# Patient Record
Sex: Male | Born: 1935
Health system: Southern US, Community
[De-identification: ages and names within clinical notes are randomized; demographics above are authoritative.]

## PROBLEM LIST (undated history)

## (undated) DIAGNOSIS — E785 Hyperlipidemia, unspecified: Secondary | ICD-10-CM

## (undated) DIAGNOSIS — E119 Type 2 diabetes mellitus without complications: Secondary | ICD-10-CM

## (undated) DIAGNOSIS — D649 Anemia, unspecified: Secondary | ICD-10-CM

## (undated) DIAGNOSIS — N200 Calculus of kidney: Secondary | ICD-10-CM

## (undated) DIAGNOSIS — J449 Chronic obstructive pulmonary disease, unspecified: Secondary | ICD-10-CM

## (undated) DIAGNOSIS — N138 Other obstructive and reflux uropathy: Secondary | ICD-10-CM

## (undated) DIAGNOSIS — N401 Enlarged prostate with lower urinary tract symptoms: Secondary | ICD-10-CM

## (undated) DIAGNOSIS — I10 Essential (primary) hypertension: Secondary | ICD-10-CM

## (undated) DIAGNOSIS — N183 Chronic kidney disease, stage 3 (moderate): Secondary | ICD-10-CM

## (undated) HISTORY — PX: OTHER SURGICAL HISTORY: SHX169

---

## 2011-06-19 HISTORY — PX: EYE SURGERY: SHX253

## 2011-09-04 DIAGNOSIS — H6120 Impacted cerumen, unspecified ear: Secondary | ICD-10-CM | POA: Diagnosis not present

## 2011-09-07 DIAGNOSIS — H6120 Impacted cerumen, unspecified ear: Secondary | ICD-10-CM | POA: Diagnosis not present

## 2011-09-28 DIAGNOSIS — E7800 Pure hypercholesterolemia, unspecified: Secondary | ICD-10-CM | POA: Diagnosis not present

## 2011-09-28 DIAGNOSIS — I10 Essential (primary) hypertension: Secondary | ICD-10-CM | POA: Diagnosis not present

## 2011-09-28 DIAGNOSIS — N183 Chronic kidney disease, stage 3 unspecified: Secondary | ICD-10-CM | POA: Diagnosis not present

## 2011-11-16 DIAGNOSIS — N401 Enlarged prostate with lower urinary tract symptoms: Secondary | ICD-10-CM | POA: Diagnosis not present

## 2011-11-16 DIAGNOSIS — R972 Elevated prostate specific antigen [PSA]: Secondary | ICD-10-CM | POA: Diagnosis not present

## 2012-01-31 DIAGNOSIS — E7800 Pure hypercholesterolemia, unspecified: Secondary | ICD-10-CM | POA: Diagnosis not present

## 2012-01-31 DIAGNOSIS — I10 Essential (primary) hypertension: Secondary | ICD-10-CM | POA: Diagnosis not present

## 2012-02-11 DIAGNOSIS — E119 Type 2 diabetes mellitus without complications: Secondary | ICD-10-CM | POA: Diagnosis not present

## 2012-02-11 DIAGNOSIS — H2510 Age-related nuclear cataract, unspecified eye: Secondary | ICD-10-CM | POA: Diagnosis not present

## 2012-02-11 DIAGNOSIS — H26019 Infantile and juvenile cortical, lamellar, or zonular cataract, unspecified eye: Secondary | ICD-10-CM | POA: Diagnosis not present

## 2012-02-27 DIAGNOSIS — H2510 Age-related nuclear cataract, unspecified eye: Secondary | ICD-10-CM | POA: Diagnosis not present

## 2012-03-05 DIAGNOSIS — H2510 Age-related nuclear cataract, unspecified eye: Secondary | ICD-10-CM | POA: Diagnosis not present

## 2012-03-05 DIAGNOSIS — H21569 Pupillary abnormality, unspecified eye: Secondary | ICD-10-CM | POA: Diagnosis not present

## 2012-03-06 DIAGNOSIS — H2510 Age-related nuclear cataract, unspecified eye: Secondary | ICD-10-CM | POA: Diagnosis not present

## 2012-03-12 DIAGNOSIS — H2510 Age-related nuclear cataract, unspecified eye: Secondary | ICD-10-CM | POA: Diagnosis not present

## 2012-03-12 DIAGNOSIS — H21569 Pupillary abnormality, unspecified eye: Secondary | ICD-10-CM | POA: Diagnosis not present

## 2012-03-18 HISTORY — PX: CATARACT EXTRACTION, BILATERAL: SHX1313

## 2012-06-24 DIAGNOSIS — J42 Unspecified chronic bronchitis: Secondary | ICD-10-CM | POA: Diagnosis not present

## 2012-06-24 DIAGNOSIS — E7800 Pure hypercholesterolemia, unspecified: Secondary | ICD-10-CM | POA: Diagnosis not present

## 2012-06-24 DIAGNOSIS — I10 Essential (primary) hypertension: Secondary | ICD-10-CM | POA: Diagnosis not present

## 2012-07-03 DIAGNOSIS — Z961 Presence of intraocular lens: Secondary | ICD-10-CM | POA: Diagnosis not present

## 2012-07-17 ENCOUNTER — Inpatient Hospital Stay (HOSPITAL_COMMUNITY): Payer: BC Managed Care – PPO

## 2012-07-17 ENCOUNTER — Emergency Department (HOSPITAL_COMMUNITY): Payer: BC Managed Care – PPO

## 2012-07-17 ENCOUNTER — Inpatient Hospital Stay (HOSPITAL_COMMUNITY)
Admission: EM | Admit: 2012-07-17 | Discharge: 2012-08-04 | DRG: 541 | Disposition: A | Discharge status: Skilled Nursing Facility | Payer: BC Managed Care – PPO | Attending: Internal Medicine | Admitting: Internal Medicine

## 2012-07-17 ENCOUNTER — Encounter (HOSPITAL_COMMUNITY): Payer: Self-pay | Admitting: *Deleted

## 2012-07-17 DIAGNOSIS — R0902 Hypoxemia: Secondary | ICD-10-CM | POA: Insufficient documentation

## 2012-07-17 DIAGNOSIS — E8779 Other fluid overload: Secondary | ICD-10-CM | POA: Diagnosis present

## 2012-07-17 DIAGNOSIS — I2699 Other pulmonary embolism without acute cor pulmonale: Principal | ICD-10-CM | POA: Diagnosis present

## 2012-07-17 DIAGNOSIS — D649 Anemia, unspecified: Secondary | ICD-10-CM | POA: Diagnosis present

## 2012-07-17 DIAGNOSIS — I82409 Acute embolism and thrombosis of unspecified deep veins of unspecified lower extremity: Secondary | ICD-10-CM | POA: Diagnosis not present

## 2012-07-17 DIAGNOSIS — J189 Pneumonia, unspecified organism: Secondary | ICD-10-CM | POA: Diagnosis present

## 2012-07-17 DIAGNOSIS — M5126 Other intervertebral disc displacement, lumbar region: Secondary | ICD-10-CM | POA: Diagnosis present

## 2012-07-17 DIAGNOSIS — M48061 Spinal stenosis, lumbar region without neurogenic claudication: Secondary | ICD-10-CM | POA: Diagnosis present

## 2012-07-17 DIAGNOSIS — D72829 Elevated white blood cell count, unspecified: Secondary | ICD-10-CM | POA: Diagnosis present

## 2012-07-17 DIAGNOSIS — M479 Spondylosis, unspecified: Secondary | ICD-10-CM | POA: Diagnosis present

## 2012-07-17 DIAGNOSIS — J40 Bronchitis, not specified as acute or chronic: Secondary | ICD-10-CM | POA: Diagnosis present

## 2012-07-17 DIAGNOSIS — J9620 Acute and chronic respiratory failure, unspecified whether with hypoxia or hypercapnia: Secondary | ICD-10-CM | POA: Diagnosis present

## 2012-07-17 DIAGNOSIS — J449 Chronic obstructive pulmonary disease, unspecified: Secondary | ICD-10-CM

## 2012-07-17 DIAGNOSIS — R0602 Shortness of breath: Secondary | ICD-10-CM | POA: Diagnosis not present

## 2012-07-17 DIAGNOSIS — J9600 Acute respiratory failure, unspecified whether with hypoxia or hypercapnia: Secondary | ICD-10-CM | POA: Diagnosis present

## 2012-07-17 DIAGNOSIS — N179 Acute kidney failure, unspecified: Secondary | ICD-10-CM | POA: Diagnosis not present

## 2012-07-17 DIAGNOSIS — N189 Chronic kidney disease, unspecified: Secondary | ICD-10-CM | POA: Diagnosis not present

## 2012-07-17 DIAGNOSIS — R339 Retention of urine, unspecified: Secondary | ICD-10-CM | POA: Diagnosis present

## 2012-07-17 DIAGNOSIS — R29898 Other symptoms and signs involving the musculoskeletal system: Secondary | ICD-10-CM | POA: Diagnosis not present

## 2012-07-17 DIAGNOSIS — Z79899 Other long term (current) drug therapy: Secondary | ICD-10-CM

## 2012-07-17 DIAGNOSIS — I714 Abdominal aortic aneurysm, without rupture, unspecified: Secondary | ICD-10-CM | POA: Diagnosis present

## 2012-07-17 DIAGNOSIS — N139 Obstructive and reflux uropathy, unspecified: Secondary | ICD-10-CM | POA: Diagnosis present

## 2012-07-17 DIAGNOSIS — E785 Hyperlipidemia, unspecified: Secondary | ICD-10-CM | POA: Diagnosis present

## 2012-07-17 DIAGNOSIS — IMO0002 Reserved for concepts with insufficient information to code with codable children: Secondary | ICD-10-CM

## 2012-07-17 DIAGNOSIS — R0609 Other forms of dyspnea: Secondary | ICD-10-CM | POA: Diagnosis not present

## 2012-07-17 DIAGNOSIS — N138 Other obstructive and reflux uropathy: Secondary | ICD-10-CM | POA: Diagnosis present

## 2012-07-17 DIAGNOSIS — N200 Calculus of kidney: Secondary | ICD-10-CM | POA: Diagnosis present

## 2012-07-17 DIAGNOSIS — M79609 Pain in unspecified limb: Secondary | ICD-10-CM

## 2012-07-17 DIAGNOSIS — J441 Chronic obstructive pulmonary disease with (acute) exacerbation: Secondary | ICD-10-CM | POA: Diagnosis present

## 2012-07-17 DIAGNOSIS — N401 Enlarged prostate with lower urinary tract symptoms: Secondary | ICD-10-CM | POA: Diagnosis present

## 2012-07-17 DIAGNOSIS — N183 Chronic kidney disease, stage 3 unspecified: Secondary | ICD-10-CM | POA: Diagnosis present

## 2012-07-17 DIAGNOSIS — I10 Essential (primary) hypertension: Secondary | ICD-10-CM | POA: Diagnosis present

## 2012-07-17 DIAGNOSIS — R000 Tachycardia, unspecified: Secondary | ICD-10-CM | POA: Diagnosis present

## 2012-07-17 DIAGNOSIS — I129 Hypertensive chronic kidney disease with stage 1 through stage 4 chronic kidney disease, or unspecified chronic kidney disease: Secondary | ICD-10-CM | POA: Diagnosis present

## 2012-07-17 DIAGNOSIS — E119 Type 2 diabetes mellitus without complications: Secondary | ICD-10-CM | POA: Diagnosis present

## 2012-07-17 DIAGNOSIS — E876 Hypokalemia: Secondary | ICD-10-CM | POA: Diagnosis not present

## 2012-07-17 DIAGNOSIS — M7989 Other specified soft tissue disorders: Secondary | ICD-10-CM

## 2012-07-17 HISTORY — DX: Hyperlipidemia, unspecified: E78.5

## 2012-07-17 HISTORY — DX: Calculus of kidney: N20.0

## 2012-07-17 HISTORY — DX: Other obstructive and reflux uropathy: N13.8

## 2012-07-17 HISTORY — DX: Benign prostatic hyperplasia with lower urinary tract symptoms: N40.1

## 2012-07-17 HISTORY — DX: Chronic kidney disease, stage 3 unspecified: N18.30

## 2012-07-17 HISTORY — DX: Essential (primary) hypertension: I10

## 2012-07-17 HISTORY — DX: Type 2 diabetes mellitus without complications: E11.9

## 2012-07-17 HISTORY — DX: Chronic obstructive pulmonary disease, unspecified: J44.9

## 2012-07-17 HISTORY — DX: Chronic kidney disease, stage 3 (moderate): N18.3

## 2012-07-17 HISTORY — DX: Anemia, unspecified: D64.9

## 2012-07-17 LAB — BLOOD GAS, ARTERIAL
Acid-base deficit: 4.2 mmol/L — ABNORMAL HIGH (ref 0.0–2.0)
Drawn by: 313061
FIO2: 1 %
pCO2 arterial: 42.5 mmHg (ref 35.0–45.0)
pH, Arterial: 7.321 — ABNORMAL LOW (ref 7.350–7.450)
pO2, Arterial: 259 mmHg — ABNORMAL HIGH (ref 80.0–100.0)

## 2012-07-17 LAB — COMPREHENSIVE METABOLIC PANEL
ALT: 21 U/L (ref 0–53)
Alkaline Phosphatase: 116 U/L (ref 39–117)
CO2: 23 mEq/L (ref 19–32)
GFR calc Af Amer: 36 mL/min — ABNORMAL LOW (ref >90)
GFR calc non Af Amer: 31 mL/min — ABNORMAL LOW (ref >90)
Glucose, Bld: 214 mg/dL — ABNORMAL HIGH (ref 70–99)
Potassium: 4.9 mEq/L (ref 3.5–5.1)
Sodium: 137 mEq/L (ref 135–145)
Total Bilirubin: 1.1 mg/dL (ref 0.3–1.2)

## 2012-07-17 LAB — GLUCOSE, CAPILLARY
Glucose-Capillary: 153 mg/dL — ABNORMAL HIGH (ref 70–99)
Glucose-Capillary: 152 mg/dL — ABNORMAL HIGH (ref 70–99)

## 2012-07-17 LAB — CBC WITH DIFFERENTIAL/PLATELET
Eosinophils Relative: 0 % (ref 0–5)
Lymphocytes Relative: 4 % — ABNORMAL LOW (ref 12–46)
Lymphs Abs: 0.8 10*3/uL (ref 0.7–4.0)
MCV: 95.1 fL (ref 78.0–100.0)
Neutrophils Relative %: 89 % — ABNORMAL HIGH (ref 43–77)
Platelets: 155 10*3/uL (ref 150–400)
RBC: 5.26 MIL/uL (ref 4.22–5.81)
WBC: 19.4 10*3/uL — ABNORMAL HIGH (ref 4.0–10.5)

## 2012-07-17 LAB — MAGNESIUM: Magnesium: 1.8 mg/dL (ref 1.5–2.5)

## 2012-07-17 LAB — APTT: aPTT: 31 seconds (ref 24–37)

## 2012-07-17 MED ORDER — LEVOFLOXACIN IN D5W 750 MG/150ML IV SOLN
750.0000 mg | INTRAVENOUS | Status: DC
  Administered 2012-07-17: 750 mg via INTRAVENOUS
  Filled 2012-07-17 (×2): qty 150

## 2012-07-17 MED ORDER — IOHEXOL 350 MG/ML SOLN: 80.0000 mL | Freq: Once | INTRAVENOUS | Status: DC | PRN

## 2012-07-17 MED ORDER — HEPARIN BOLUS VIA INFUSION
4000.0000 [IU] | Freq: Once | INTRAVENOUS | Status: AC
  Administered 2012-07-17: 4000 [IU] via INTRAVENOUS

## 2012-07-17 MED ORDER — ONDANSETRON HCL 4 MG PO TABS: 4.0000 mg | ORAL_TABLET | Freq: Four times a day (QID) | ORAL | Status: DC | PRN

## 2012-07-17 MED ORDER — SODIUM CHLORIDE 0.9 % IV SOLN
INTRAVENOUS | Status: AC
  Administered 2012-07-17: 50 mL/h via INTRAVENOUS

## 2012-07-17 MED ORDER — RED YEAST RICE 600 MG PO TABS: 1800.0000 mg | ORAL_TABLET | Freq: Every morning | ORAL | Status: DC

## 2012-07-17 MED ORDER — ALUM & MAG HYDROXIDE-SIMETH 200-200-20 MG/5ML PO SUSP: 30.0000 mL | Freq: Four times a day (QID) | ORAL | Status: DC | PRN

## 2012-07-17 MED ORDER — HEPARIN (PORCINE) IN NACL 100-0.45 UNIT/ML-% IJ SOLN
1500.0000 [IU]/h | INTRAMUSCULAR | Status: DC
  Administered 2012-07-17: 1500 [IU]/h via INTRAVENOUS
  Filled 2012-07-17 (×2): qty 250

## 2012-07-17 MED ORDER — ACETAMINOPHEN 325 MG PO TABS: 650.0000 mg | ORAL_TABLET | Freq: Four times a day (QID) | ORAL | Status: DC | PRN

## 2012-07-17 MED ORDER — SENNOSIDES-DOCUSATE SODIUM 8.6-50 MG PO TABS
1.0000 | ORAL_TABLET | Freq: Every evening | ORAL | Status: DC | PRN
  Filled 2012-07-17: qty 1

## 2012-07-17 MED ORDER — SODIUM CHLORIDE 0.9 % IV SOLN
250.0000 mL | INTRAVENOUS | Status: DC | PRN
  Filled 2012-07-17: qty 250

## 2012-07-17 MED ORDER — LEVALBUTEROL HCL 0.63 MG/3ML IN NEBU
0.6300 mg | INHALATION_SOLUTION | Freq: Four times a day (QID) | RESPIRATORY_TRACT | Status: DC
  Administered 2012-07-17 – 2012-07-21 (×16): 0.63 mg via RESPIRATORY_TRACT
  Filled 2012-07-17 (×22): qty 3

## 2012-07-17 MED ORDER — GUAIFENESIN 100 MG/5ML PO SYRP
200.0000 mg | ORAL_SOLUTION | ORAL | Status: DC | PRN
  Filled 2012-07-17: qty 118

## 2012-07-17 MED ORDER — ALBUTEROL SULFATE (5 MG/ML) 0.5% IN NEBU
2.5000 mg | INHALATION_SOLUTION | Freq: Once | RESPIRATORY_TRACT | Status: AC
  Administered 2012-07-17: 2.5 mg via RESPIRATORY_TRACT

## 2012-07-17 MED ORDER — SODIUM CHLORIDE 0.9 % IJ SOLN
3.0000 mL | Freq: Two times a day (BID) | INTRAMUSCULAR | Status: DC
  Administered 2012-07-20 – 2012-08-04 (×17): 3 mL via INTRAVENOUS

## 2012-07-17 MED ORDER — MOMETASONE FURO-FORMOTEROL FUM 200-5 MCG/ACT IN AERO
2.0000 | INHALATION_SPRAY | Freq: Two times a day (BID) | RESPIRATORY_TRACT | Status: DC
  Administered 2012-07-17 – 2012-08-04 (×35): 2 via RESPIRATORY_TRACT
  Filled 2012-07-17 (×2): qty 8.8

## 2012-07-17 MED ORDER — TAMSULOSIN HCL 0.4 MG PO CAPS
0.4000 mg | ORAL_CAPSULE | Freq: Every day | ORAL | Status: DC
  Administered 2012-07-17 – 2012-08-03 (×18): 0.4 mg via ORAL
  Filled 2012-07-17 (×19): qty 1

## 2012-07-17 MED ORDER — ONDANSETRON HCL 4 MG/2ML IJ SOLN: 4.0000 mg | Freq: Four times a day (QID) | INTRAMUSCULAR | Status: DC | PRN

## 2012-07-17 MED ORDER — LEVOFLOXACIN IN D5W 750 MG/150ML IV SOLN
750.0000 mg | INTRAVENOUS | Status: DC
  Filled 2012-07-17: qty 150

## 2012-07-17 MED ORDER — LEVALBUTEROL HCL 0.63 MG/3ML IN NEBU
0.6300 mg | INHALATION_SOLUTION | RESPIRATORY_TRACT | Status: DC | PRN
  Filled 2012-07-17: qty 3

## 2012-07-17 MED ORDER — ACETAMINOPHEN 650 MG RE SUPP: 650.0000 mg | Freq: Four times a day (QID) | RECTAL | Status: DC | PRN

## 2012-07-17 MED ORDER — GUAIFENESIN 100 MG/5ML PO SOLN
200.0000 mg | ORAL | Status: DC | PRN
  Administered 2012-07-21: 200 mg via ORAL
  Filled 2012-07-17: qty 10

## 2012-07-17 MED ORDER — ALBUTEROL SULFATE (5 MG/ML) 0.5% IN NEBU
INHALATION_SOLUTION | RESPIRATORY_TRACT | Status: AC
  Filled 2012-07-17: qty 0.5

## 2012-07-17 MED ORDER — INSULIN ASPART 100 UNIT/ML ~~LOC~~ SOLN
0.0000 [IU] | Freq: Three times a day (TID) | SUBCUTANEOUS | Status: DC
  Administered 2012-07-18: 3 [IU] via SUBCUTANEOUS
  Administered 2012-07-19: 11 [IU] via SUBCUTANEOUS
  Administered 2012-07-19: 3 [IU] via SUBCUTANEOUS
  Administered 2012-07-19: 2 [IU] via SUBCUTANEOUS

## 2012-07-17 MED ORDER — SODIUM CHLORIDE 0.9 % IJ SOLN
3.0000 mL | INTRAMUSCULAR | Status: DC | PRN
  Administered 2012-07-27: 3 mL via INTRAVENOUS

## 2012-07-17 MED ORDER — BUDESONIDE-FORMOTEROL FUMARATE 160-4.5 MCG/ACT IN AERO: 2.0000 | INHALATION_SPRAY | Freq: Two times a day (BID) | RESPIRATORY_TRACT | Status: DC

## 2012-07-17 MED ORDER — ALBUTEROL SULFATE (5 MG/ML) 0.5% IN NEBU: 2.5000 mg | INHALATION_SOLUTION | RESPIRATORY_TRACT | Status: DC | PRN

## 2012-07-17 MED ORDER — OXYCODONE HCL 5 MG PO TABS
5.0000 mg | ORAL_TABLET | ORAL | Status: DC | PRN
  Administered 2012-07-24 – 2012-08-01 (×7): 5 mg via ORAL
  Filled 2012-07-17 (×8): qty 1

## 2012-07-17 MED ORDER — IPRATROPIUM BROMIDE 0.02 % IN SOLN: 0.5000 mg | RESPIRATORY_TRACT | Status: DC | PRN

## 2012-07-17 MED ORDER — SODIUM CHLORIDE 0.9 % IJ SOLN
3.0000 mL | Freq: Two times a day (BID) | INTRAMUSCULAR | Status: DC
  Administered 2012-07-17: 3 mL via INTRAVENOUS

## 2012-07-17 MED ORDER — IPRATROPIUM BROMIDE 0.02 % IN SOLN
0.5000 mg | Freq: Four times a day (QID) | RESPIRATORY_TRACT | Status: DC
  Administered 2012-07-17 – 2012-08-04 (×69): 0.5 mg via RESPIRATORY_TRACT
  Filled 2012-07-17 (×72): qty 2.5

## 2012-07-17 MED ORDER — ALBUTEROL SULFATE (5 MG/ML) 0.5% IN NEBU
2.5000 mg | INHALATION_SOLUTION | Freq: Once | RESPIRATORY_TRACT | Status: DC
  Filled 2012-07-17: qty 0.5

## 2012-07-17 MED ORDER — SODIUM CHLORIDE 0.9 % IV BOLUS (SEPSIS)
1000.0000 mL | Freq: Once | INTRAVENOUS | Status: AC
  Administered 2012-07-17: 1000 mL via INTRAVENOUS

## 2012-07-17 MED ORDER — BISACODYL 5 MG PO TBEC: 5.0000 mg | DELAYED_RELEASE_TABLET | Freq: Every day | ORAL | Status: DC | PRN

## 2012-07-17 NOTE — H&P (Signed)
[facility] History and Physical  Alexander Kirby WUJ:[id number] DOB: 1934/10/11 DOA: 07/17/2012  Referring physician: Dr. Jeraldine Loots PCP: Ailene Ravel, MD  Specialists: None  Chief Complaint: I can't breathe. My COPD is acting up.  HPI: Alexander Kirby is a 78 y.o. male with history of COPD with remote history of tobacco use, chronic kidney disease stage III, diabetes mellitus, hyperlipidemia, BPH being followed by Dr. Wallace Cullens. Urology, hypertension who presents to the ED with a 3 week history of worsening shortness of breath, wheezing, productive cough of greenish sputum occasionally and whitish sputum, generalized weakness, and lower extremity edema. Patient states she had presented to PCPs office a couple of weeks prior to admission and was treated at that time were for COPD exacerbation with doxycycline. Patient finished the course of Doxy call back a week later and had that refilled again. Patient presented to his PCPs office on the day of admission with continued shortness of breath and wheezing coughing and productive mucus. In PCPs office patient was noted to be hypoxic with sats of 80% on room air, and blood pressure of 84/70. EMS was called patient with was placed on oxygen and transported to the ED. In the ED d-dimer which was obtained was elevated at 11.20. CBC obtained had a white count of 19.4 otherwise was within normal limits. Comprehensive metabolic profile was a BUN of 40 and a creatinine of 1.99 with a glucose of 214. Patient was unable to lay flat for CT scan and a such CT angiogram cannot be obtained. Lower extremity Dopplers done was consistent with a left lower extremity DVT. Patient was started on IV heparin. We were called to admit the patient for further evaluation and management.  Review of Systems: The patient denies anorexia, fever, weight loss,, vision loss, decreased hearing, hoarseness, chest pain, syncope, dyspnea on exertion, peripheral edema, balance deficits, hemoptysis,  abdominal pain, melena, hematochezia, severe indigestion/heartburn, hematuria, incontinence, genital sores, muscle weakness, suspicious skin lesions, transient blindness, difficulty walking, depression, unusual weight change, abnormal bleeding, enlarged lymph nodes, angioedema, and breast masses.   Past Medical History  Diagnosis Date  . COPD (chronic obstructive pulmonary disease)   . Hypertension   . Diabetes mellitus without complication   . Anemia   . Diabetes mellitus 07/17/2012  . HTN (hypertension) 07/17/2012  . Hyperlipidemia 07/17/2012  . CKD (chronic kidney disease) stage 3, GFR 30-59 ml/min 07/17/2012  . BPH (benign prostatic hypertrophy) with urinary obstruction 07/17/2012  . Calculus of kidney 07/17/2012   Past Surgical History  Procedure Date  . Cataract extraction, bilateral Oct. 2013    bilateral eyes  . Other surgical history 72 months old    pt states "I was ruptured and had surgery."  . Eye surgery 2013    cataract bilateral extraction   Social History:  reports that he quit smoking about 12 years ago. He has never used smokeless tobacco. He reports that he does not drink alcohol or use illicit drugs.  No Known Allergies  History reviewed. No pertinent family history.   Prior to Admission medications   Medication Sig Start Date End Date Taking? Authorizing Provider  albuterol (PROVENTIL) (2.5 MG/3ML) 0.083% nebulizer solution Take 2.5 mg by nebulization every 6 (six) hours as needed. FOR SHORTNESS OF BREATH OR WHEEZING   Yes Historical Provider, MD  budesonide-formoterol (SYMBICORT) 160-4.5 MCG/ACT inhaler Inhale 2 puffs into the lungs 2 (two) times daily.   Yes Historical Provider, MD  enalapril-hydrochlorothiazide (VASERETIC) 10-25 MG per tablet Take 1 tablet by  mouth every morning.   Yes Historical Provider, MD  Fluticasone-Salmeterol (ADVAIR) 500-50 MCG/DOSE AEPB Inhale 1 puff into the lungs every 12 (twelve) hours.   Yes Historical Provider, MD  glimepiride  (AMARYL) 1 MG tablet Take 1 mg by mouth daily before breakfast.   Yes Historical Provider, MD  NIFEdipine (PROCARDIA XL/ADALAT-CC) 60 MG 24 hr tablet Take 60 mg by mouth every morning.   Yes Historical Provider, MD  pirbuterol (MAXAIR) 200 MCG/INH inhaler Inhale 2 puffs into the lungs 4 (four) times daily as needed. FOR SHORTNESS OF BREATH OR WHEEZING   Yes Historical Provider, MD  Plant Sterols and Stanols (CHOLEST OFF) 450 MG TABS Take 450 mg by mouth every morning.   Yes Historical Provider, MD  Red Yeast Rice 600 MG TABS Take 1,800 mg by mouth every morning.   Yes Historical Provider, MD  Tamsulosin HCl (FLOMAX) 0.4 MG CAPS Take 0.4 mg by mouth daily after supper.   Yes Historical Provider, MD   Physical Exam: Filed Vitals:   07/17/12 1214 07/17/12 1300 07/17/12 1521 07/17/12 1637  BP:    132/78  Pulse:    107  Temp:      TempSrc:      Resp:    20  Height:  5\' 9"  (1.753 m)    Weight:   93.123 kg (205 lb 4.8 oz)   SpO2: 95%   100%     General:  Within the right on a gurney on a nonrebreather speaking in full sentences with mild use of accessory muscles of respiration.  Eyes: Pupils equal round and reactive to light and accommodation. Extraocular movements intact.  ENT: Oropharynx is clear, no lesions, no exudates.   Neck: Supple with no lymphadenopathy.  Cardiovascular: Tachycardic regular rhythm. No murmurs rubs or gallops. 1+ bilateral lower extremity edema.  Respiratory: Patient with minimal to mild expiratory wheezing. No crackles. No rhonchi. Upper airway noise.  Abdomen: Obese, soft, nontender, nondistended, positive bowel sounds.  Skin: No rashes or lesions.  Musculoskeletal: 5 out of 5 bilateral upper extremity strength. 5 out of 5 bilateral lower extremity strength.  Psychiatric: Normal mood. Normal affect. Fair insight. Fair judgment.  Neurologic: Alert and oriented x3. CN 2 through 12 are grossly intact. No focal deficits. Gait not tested secondary to safety.    Labs on Admission:  Basic Metabolic Panel:  Lab 07/17/12 5366  NA 137  K 4.9  CL 99  CO2 23  GLUCOSE 214*  BUN 40*  CREATININE 1.99*  CALCIUM 9.4  MG --  PHOS --   Liver Function Tests:  Lab 07/17/12 1315  AST 18  ALT 21  ALKPHOS 116  BILITOT 1.1  PROT 6.4  ALBUMIN 2.7*   No results found for this basename: LIPASE:5,AMYLASE:5 in the last 168 hours No results found for this basename: AMMONIA:5 in the last 168 hours CBC:  Lab 07/17/12 1315  WBC 19.4*  NEUTROABS 17.2*  HGB 16.9  HCT 50.0  MCV 95.1  PLT 155   Cardiac Enzymes:  Lab 07/17/12 1315  CKTOTAL --  CKMB --  CKMBINDEX --  TROPONINI <0.30    BNP (last 3 results)  Basename 07/17/12 1315  PROBNP 1312.0*   CBG:  Lab 07/17/12 1205  GLUCAP 204*    Radiological Exams on Admission: Dg Chest 2 View  07/17/2012  *RADIOLOGY REPORT*  Clinical Data: History of dyspnea and COPD.  CHEST - 2 VIEW  Comparison: None.  Findings: There is mild enlargement cardiac silhouette.  There is  rounded prominence of the main pulmonary artery area. Ectasia and nonaneurysmal calcification of the thoracic aorta are seen.  Patchy infiltrative densities and atelectasis are seen in the right lung base. Haziness and increased markings are seen in the left lung base.  This may be associated with epicardial fat pad.  There is flattening of the low-lying diaphragm on lateral image consistent with overall hyperinflation configuration and COPD.  There is osteopenic appearance of bones.  Changes of degenerative disc disease and degenerative spondylosis are present.  IMPRESSION: Overall hyperinflation configuration consistent with COPD. Patchy infiltrative densities and atelectasis are seen in the right lung base. Haziness and increased markings are seen in the left lung base.  This may be associated with epicardial fat pad. Chronic findings described above.   Original Report Authenticated By: Onalee Hua Call     EKG:  None  Assessment/Plan Principal Problem:  *Acute respiratory failure Active Problems:  DVT (deep venous thrombosis)  Leukocytosis  Acute on chronic renal failure  Hypoxia  COPD (chronic obstructive pulmonary disease)  Diabetes mellitus  HTN (hypertension)  Anemia  Hyperlipidemia  CKD (chronic kidney disease) stage 3, GFR 30-59 ml/min  BPH (benign prostatic hypertrophy) with urinary obstruction  Calculus of kidney   #1 acute respiratory failure Likely secondary to probable PE in the setting of possible COPD exacerbation. Lower extremity Dopplers positive for left lower extremity DVT. Patient unable to get a CT angio secondary to chronic kidney disease and inability to lay flat. Patient with sats of 80% on route per EMS. Patient also with wheezing on exam. Chest x-ray negative for any acute infiltrate. Will admit to step down unit. Will check ABG. We'll place empirically on IV heparin. We'll place on oxygen, IV Levaquin, nebs, oxygen. Follow.  #2 hypoxia Likely secondary to probable PE. Patient with left lower extremity DVT on Doppler ultrasound. Patient presented to the ED hypoxic, tachycardic, with worsening shortness of breath. Place on IV heparin. Follow.  #3 left lower extremity DVT IV heparin.  #4 COPD Patient with some weeping on exam. Patient also had upper respirations symptoms and a productive cough per PCPs notes. We'll place on IV Levaquin, nebs, oxygen, follow.  #5 acute on chronic kidney disease Per PCPs records patient's last creatinine was 1.79 09/28/2011. Current creatinine is 1.99. Will check a urine sodium. Check a urine creatinine. Check a renal ultrasound. Place a Foley catheter. Will hold patient's ACE inhibitor and diuretic. Follow.  #6 anemia Follow H&H.  #7 hypertension Stable. Will hold patient's enalapril and HCTZ secondary to problem #5.  #8 leukocytosis Likely a reactive leukocytosis. Chest x-ray is negative. Will check a UA with cultures and  sensitivities. Will place empirically on IV Levaquin secondary to probable COPD exacerbation. Follow.  #9 diabetes mellitus Check a hemoglobin A1c. Place on sliding scale insulin.  #10 prophylaxis On full dose heparin for DVT prophylaxis.  Code Status: Full Family Communication: Updated patient and wife at bedside the Disposition Plan: Admit to the step down  Time spent: 75 mins  [facility] Pager [phone]  If 7PM-7AM, please contact night-coverage [email or web address] Password [email or web address] 07/17/2012, 5:09 PM

## 2012-07-17 NOTE — Progress Notes (Signed)
Pt confirms pcp as dr Nathanial Rancher epic updated

## 2012-07-17 NOTE — Progress Notes (Signed)
ANTICOAGULATION CONSULT NOTE - Initial Consult  Pharmacy Consult for heparin Indication: DVT/possible PE  No Known Allergies  Patient Measurements:   Heparin Dosing Weight: 90kg  Vital Signs: Temp: 97.6 F (36.4 C) (01/30 1149) Temp src: Oral (01/30 1149) BP: 124/68 mmHg (01/30 1149) Pulse Rate: 114  (01/30 1149)  Labs:  Basename 07/17/12 1315  HGB 16.9  HCT 50.0  PLT 155  APTT --  LABPROT --  INR --  HEPARINUNFRC --  CREATININE 1.99*  CKTOTAL --  CKMB --  TROPONINI <0.30    CrCl is unknown because there is no height on file for the current visit.   Medical History: Past Medical History  Diagnosis Date  . COPD (chronic obstructive pulmonary disease)   . Hypertension   . Diabetes mellitus without complication   . Anemia     Assessment: 77 YOM presents with SOB, LE dopplers reveals L LE DVT.  D-dimer elevated. Patient having difficulty speaking in sentences without getting SOB.  NO recent procedures, had BL cataract surgery in Oct. Wife states he bleeds easily but per medication list not on any antiplatelets or blood thinners. Patient is not certain of his weight, states ~220lbs (appears accurate), states unable to stand to get on scale.   Goal of Therapy:  Heparin level 0.3-0.7 units/ml Monitor platelets by anticoagulation protocol: Yes   Plan:   Heparin 4000 units bolus then heparin gtt 1500 units/hr  Check baseline PT/INR, aPTT  Check 8hr heparin level then daily level and CBC  Weigh patient when gets to floor  Await plans for long-term anticoagulation  Suzette Battiest, Tad Moore 07/17/2012,2:38 PM

## 2012-07-17 NOTE — ED Notes (Signed)
Per EMS, pt picked up from his PMD d/t SOB.  Pt reports visiting his PMD last week for same, was given rx for doxycycline without relief.  Per EMS, pt's sats was at 80's RA, placed on 2LNC O2 along with 2.5mg  of albuterol-sats 96%.  Per EMS, pt with bila pedal edema, with pain in his R calf.  Denies any cp at this time.  Pt is A&Ox 4.

## 2012-07-17 NOTE — ED Provider Notes (Signed)
History     CSN: [id number]  Arrival date & time 07/17/12  1139   First MD Initiated Contact with Patient 07/17/12 1144      Chief Complaint  Patient presents with  . Shortness of Breath    (Consider location/radiation/quality/duration/timing/severity/associated sxs/prior treatment) HPI  The patient presents with concern of dyspnea, ongoing chest tightness, lower extremity edema.  Symptoms actually began approximately 3 months ago, subtly, and over the past month this has had persistent cough, moderate respiratory discomfort, and until recently was fairly stable.  The past 2 weeks the patient has had increasing dyspnea, and recently been started on home oxygen, which seems to change the symptoms minimally.  He also complains of new lower extremity edema, the right greater than the left, with pain in his right calf. He denies significant chest pain, notes that there is mild tightness. He denies lightheadedness, syncope, vomiting, diarrhea. He saw his physician today, was referred here for further evaluation.  Past Medical History  Diagnosis Date  . COPD (chronic obstructive pulmonary disease)   . Hypertension   . Diabetes mellitus without complication   . Anemia     History reviewed. No pertinent past surgical history.  No family history on file.  History  Substance Use Topics  . Smoking status: Never Smoker   . Smokeless tobacco: Not on file  . Alcohol Use: No      Review of Systems  Constitutional:       Per HPI, otherwise negative  HENT:       Per HPI, otherwise negative  Eyes: Negative.   Respiratory:       Per HPI, otherwise negative  Cardiovascular:       Per HPI, otherwise negative  Gastrointestinal: Negative for vomiting.  Genitourinary: Negative.   Musculoskeletal:       Per HPI, otherwise negative  Skin: Negative.   Neurological: Negative for syncope.    Allergies  Review of patient's allergies indicates no known allergies.  Home Medications    Current Outpatient Rx  Name  Route  Sig  Dispense  Refill  . ALBUTEROL SULFATE (2.5 MG/3ML) 0.083% IN NEBU   Nebulization   Take 2.5 mg by nebulization every 6 (six) hours as needed. FOR SHORTNESS OF BREATH OR WHEEZING         . BUDESONIDE-FORMOTEROL FUMARATE 160-4.5 MCG/ACT IN AERO   Inhalation   Inhale 2 puffs into the lungs 2 (two) times daily.         . ENALAPRIL-HYDROCHLOROTHIAZIDE 10-25 MG PO TABS   Oral   Take 1 tablet by mouth every morning.         Marland Kitchen FLUTICASONE-SALMETEROL 500-50 MCG/DOSE IN AEPB   Inhalation   Inhale 1 puff into the lungs every 12 (twelve) hours.         Marland Kitchen GLIMEPIRIDE 1 MG PO TABS   Oral   Take 1 mg by mouth daily before breakfast.         . NIFEDIPINE ER OSMOTIC 60 MG PO TB24   Oral   Take 60 mg by mouth every morning.         Marland Kitchen PIRBUTEROL ACETATE 200 MCG/INH IN AERB   Inhalation   Inhale 2 puffs into the lungs 4 (four) times daily as needed. FOR SHORTNESS OF BREATH OR WHEEZING         . PLANT STEROLS AND STANOLS 450 MG PO TABS   Oral   Take 450 mg by mouth every morning.         Marland Kitchen  RED YEAST RICE 600 MG PO TABS   Oral   Take 1,800 mg by mouth every morning.         Marland Kitchen TAMSULOSIN HCL 0.4 MG PO CAPS   Oral   Take 0.4 mg by mouth daily after supper.           BP 124/68  Pulse 114  Temp 97.6 F (36.4 C) (Oral)  Resp 22  SpO2 95%  Physical Exam  Nursing note and vitals reviewed. Constitutional: He is oriented to person, place, and time. He appears well-developed. No distress.  HENT:  Head: Normocephalic and atraumatic.  Eyes: Conjunctivae normal and EOM are normal.  Cardiovascular: Regular rhythm.  Tachycardia present.   Pulmonary/Chest: Accessory muscle usage present. No stridor. Tachypnea noted. He is in respiratory distress. He has decreased breath sounds.  Abdominal: He exhibits no distension.  Musculoskeletal: He exhibits no edema.       LE edema R>L - minimal TTP about the posterior R calf  Neurological:  He is alert and oriented to person, place, and time.  Skin: Skin is warm and dry.  Psychiatric: He has a normal mood and affect.    ED Course  Procedures (including critical care time)  Labs Reviewed  GLUCOSE, CAPILLARY - Abnormal; Notable for the following:    Glucose-Capillary 204 (*)     All other components within normal limits  CBC WITH DIFFERENTIAL  COMPREHENSIVE METABOLIC PANEL  TROPONIN I  D-DIMER, QUANTITATIVE  PRO B NATRIURETIC PEPTIDE   No results found. Cardiac: 110st, abnormal  O2 - 91% Schlusser - abnormal   Date: 07/17/2012  Rate: 117  Rhythm: sinus tachycardia  QRS Axis: right  Intervals: normal  ST/T Wave abnormalities: nonspecific T wave changes  Conduction Disutrbances:nonspecific intraventricular conduction delay  Narrative Interpretation:   Old EKG Reviewed: none available  ABNORMAL  No diagnosis found.  Following an initial neb there was no appreciable change.  Update: I discussed the patient's ultrasound results with the tach, there is evidence of DVT on the left, which is unusual given the patient's more pronounced symptoms from the right.  With the persistent dyspnea, tachycardia, there is concurrence of PE, he received empiric heparin.  Update: The patient's diet was elevated, given the aforementioned findings, we attempted to a range for an angiogram, with decreased dilute given his renal dysfunction.  The patient cannot lie flat.  V/Q scan, also considered, but is unlikely to provide additional information given the abnormal chest x-ray.  I discussed all findings / options for additional eval with our radiologists  Update: I discussed all findings with the patient and his wife. MDM  This patient without significant medical history now presents with ongoing dyspnea, edema, generalized discomfort.  The patient has minimal risk factors for thromboembolic disease, this was an immediate concern.  Infection or COPD or CHF are also considerations.  The  patient's labs and evaluation are notable for demonstration of left-sided DVT.  With these abnormal vital signs, the patient apparently receiving treatment for PE/DVT.  Notably, the patient BNP also suggests an element of heart failure.  Additional imaging was unable to be obtained in the emergency Department DG the patient's inability to lie flat.  CRITICAL CARE Performed by: Gerhard Munch   Total critical care time: 35  Critical care time was exclusive of separately billable procedures and treating other patients.  Critical care was necessary to treat or prevent imminent or life-threatening deterioration.  Critical care was time spent personally by me on the  following activities: development of treatment plan with patient and/or surrogate as well as nursing, discussions with consultants, evaluation of patient's response to treatment, examination of patient, obtaining history from patient or surrogate, ordering and performing treatments and interventions, ordering and review of laboratory studies, ordering and review of radiographic studies, pulse oximetry and re-evaluation of patient's condition.     Gerhard Munch, MD 07/17/12 1600

## 2012-07-17 NOTE — ED Notes (Signed)
ZOX:[id number]<AV> Expected date:07/17/12<BR> Expected time:11:34 AM<BR> Means of arrival:Ambulance<BR> Comments:<BR> Elderly/SOB/96%2LNC

## 2012-07-17 NOTE — Progress Notes (Signed)
PHARMACIST - PHYSICIAN ORDER COMMUNICATION  CONCERNING: P&T Medication Policy on Herbal Medications  DESCRIPTION:  This patient's order for:  Red Yeast Rice  has been noted.  This product(s) is classified as an "herbal" or natural product. Due to a lack of definitive safety studies or FDA approval, nonstandard manufacturing practices, plus the potential risk of unknown drug-drug interactions while on inpatient medications, the Pharmacy and Therapeutics Committee does not permit the use of "herbal" or natural products of this type within [facility].   ACTION TAKEN: The pharmacy department is unable to verify this order at this time and your patient has been informed of this safety policy. Please reevaluate patient's clinical condition at discharge and address if the herbal or natural product(s) should be resumed at that time.  Charolotte Eke, PharmD, pager [phone]. 07/17/2012,6:52 PM.

## 2012-07-17 NOTE — Progress Notes (Signed)
VASCULAR LAB PRELIMINARY  PRELIMINARY  PRELIMINARY  PRELIMINARY  Bilateral lower extremity venous duplex completed.    Preliminary report:  Right:  No evidence of DVT, superficial thrombosis, or Baker's cyst. Left: DVT noted coursing from the distal posterior tibial vein through the popliteal and femoral vein..  No evidence of superficial thrombosis.  No Baker's cyst.  Brianah Hopson, RVS 07/17/2012, 2:05 PM

## 2012-07-17 NOTE — ED Notes (Signed)
Pt unable to lay flat for CT, dr Jeraldine Loots notified.

## 2012-07-17 NOTE — ED Notes (Signed)
Pt reports having "chest cold" in November and sts ever since has been having shortness of breath.Pt has hx of COPD.

## 2012-07-18 ENCOUNTER — Inpatient Hospital Stay (HOSPITAL_COMMUNITY): Payer: BC Managed Care – PPO

## 2012-07-18 DIAGNOSIS — J189 Pneumonia, unspecified organism: Secondary | ICD-10-CM | POA: Diagnosis not present

## 2012-07-18 DIAGNOSIS — J9600 Acute respiratory failure, unspecified whether with hypoxia or hypercapnia: Secondary | ICD-10-CM

## 2012-07-18 DIAGNOSIS — I82409 Acute embolism and thrombosis of unspecified deep veins of unspecified lower extremity: Secondary | ICD-10-CM | POA: Diagnosis not present

## 2012-07-18 DIAGNOSIS — N179 Acute kidney failure, unspecified: Secondary | ICD-10-CM | POA: Diagnosis not present

## 2012-07-18 DIAGNOSIS — R0902 Hypoxemia: Secondary | ICD-10-CM | POA: Diagnosis not present

## 2012-07-18 DIAGNOSIS — I517 Cardiomegaly: Secondary | ICD-10-CM

## 2012-07-18 LAB — URINALYSIS, ROUTINE W REFLEX MICROSCOPIC
Hgb urine dipstick: NEGATIVE
Specific Gravity, Urine: 1.026 (ref 1.005–1.030)
Urobilinogen, UA: 1 mg/dL (ref 0.0–1.0)
pH: 5 (ref 5.0–8.0)

## 2012-07-18 LAB — CBC
MCV: 95.6 fL (ref 78.0–100.0)
Platelets: 152 10*3/uL (ref 150–400)
RBC: 4.98 MIL/uL (ref 4.22–5.81)
RDW: 13.7 % (ref 11.5–15.5)
WBC: 13.9 10*3/uL — ABNORMAL HIGH (ref 4.0–10.5)

## 2012-07-18 LAB — GLUCOSE, CAPILLARY
Glucose-Capillary: 119 mg/dL — ABNORMAL HIGH (ref 70–99)
Glucose-Capillary: 194 mg/dL — ABNORMAL HIGH (ref 70–99)
Glucose-Capillary: 205 mg/dL — ABNORMAL HIGH (ref 70–99)

## 2012-07-18 LAB — BASIC METABOLIC PANEL
Chloride: 102 mEq/L (ref 96–112)
GFR calc Af Amer: 40 mL/min — ABNORMAL LOW (ref >90)
GFR calc non Af Amer: 35 mL/min — ABNORMAL LOW (ref >90)
Glucose, Bld: 147 mg/dL — ABNORMAL HIGH (ref 70–99)
Potassium: 4.5 mEq/L (ref 3.5–5.1)
Sodium: 137 mEq/L (ref 135–145)

## 2012-07-18 LAB — SODIUM, URINE, RANDOM: Sodium, Ur: 53 mEq/L

## 2012-07-18 LAB — PROTIME-INR
INR: 1.28 (ref 0.00–1.49)
Prothrombin Time: 15.7 seconds — ABNORMAL HIGH (ref 11.6–15.2)

## 2012-07-18 LAB — STREP PNEUMONIAE URINARY ANTIGEN: Strep Pneumo Urinary Antigen: NEGATIVE

## 2012-07-18 LAB — URINE MICROSCOPIC-ADD ON

## 2012-07-18 LAB — APTT: aPTT: 148 seconds — ABNORMAL HIGH (ref 24–37)

## 2012-07-18 LAB — TROPONIN I: Troponin I: <0.3 ng/mL (ref <0.30)

## 2012-07-18 LAB — HEMOGLOBIN A1C: Mean Plasma Glucose: 194 mg/dL — ABNORMAL HIGH (ref <117)

## 2012-07-18 MED ORDER — WARFARIN - PHARMACIST DOSING INPATIENT: Freq: Every day | Status: DC

## 2012-07-18 MED ORDER — HEPARIN (PORCINE) IN NACL 100-0.45 UNIT/ML-% IJ SOLN
1300.0000 [IU]/h | INTRAMUSCULAR | Status: DC
  Administered 2012-07-18 (×2): 1300 [IU]/h via INTRAVENOUS
  Filled 2012-07-18 (×3): qty 250

## 2012-07-18 MED ORDER — HEPARIN (PORCINE) IN NACL 100-0.45 UNIT/ML-% IJ SOLN
1200.0000 [IU]/h | INTRAMUSCULAR | Status: DC
  Filled 2012-07-18: qty 250

## 2012-07-18 MED ORDER — BIOTENE DRY MOUTH MT LIQD
15.0000 mL | Freq: Two times a day (BID) | OROMUCOSAL | Status: DC
Start: 2012-07-18 — End: 2012-07-18

## 2012-07-18 MED ORDER — COUMADIN BOOK
Freq: Once | Status: AC
  Administered 2012-07-18: 18:00:00
  Filled 2012-07-18: qty 1

## 2012-07-18 MED ORDER — WARFARIN SODIUM 4 MG PO TABS
4.0000 mg | ORAL_TABLET | Freq: Once | ORAL | Status: AC
  Administered 2012-07-18: 4 mg via ORAL
  Filled 2012-07-18: qty 1

## 2012-07-18 MED ORDER — WARFARIN VIDEO
Freq: Once | Status: AC
  Administered 2012-07-18: 13:00:00

## 2012-07-18 MED ORDER — PANTOPRAZOLE SODIUM 40 MG PO TBEC
40.0000 mg | DELAYED_RELEASE_TABLET | Freq: Every day | ORAL | Status: DC
  Administered 2012-07-18 – 2012-07-22 (×5): 40 mg via ORAL
  Filled 2012-07-18 (×6): qty 1

## 2012-07-18 MED ORDER — BIOTENE DRY MOUTH MT LIQD
15.0000 mL | Freq: Two times a day (BID) | OROMUCOSAL | Status: DC
  Administered 2012-07-19 – 2012-08-04 (×33): 15 mL via OROMUCOSAL

## 2012-07-18 NOTE — Progress Notes (Signed)
*  PRELIMINARY RESULTS* Echocardiogram 2D Echocardiogram has been performed.  Jeryl Columbia 07/18/2012, 9:15 AM

## 2012-07-18 NOTE — Progress Notes (Signed)
[id number]/Talasia Saulter, RN, BSN, CCM:  CHART REVIEWED AND UPDATED.  Next chart review due on [id number]. NO DISCHARGE NEEDS PRESENT AT THIS TIME. CASE MANAGEMENT [phone] 

## 2012-07-18 NOTE — Progress Notes (Signed)
INITIAL NUTRITION ASSESSMENT  DOCUMENTATION CODES Per approved criteria  -Not Applicable   INTERVENTION: 1.  General healthful diet; encourage intake of food and beverages. Pt declines need for supplements at this time.    NUTRITION DIAGNOSIS: Inadequate oral intake related to poor appetite as evidenced by pt/wife report.   Monitor:  1.  Food/Beverage; improvement in intake, pt eating per his usual.  Reason for Assessment: MST  78 y.o. male  Admitting Dx: Acute respiratory failure  ASSESSMENT: Pt admitted with shortness of breath.  Pt with h/o COPD.  Pt and wife report pt has had frequent illness since November.   Wife describes pt as "a small eater and frequent snacker."  Pt does not consume large meals.  Pt is not sure if he has lost wt or not, stating "maybe 15 lbs in since I've been sick."   RD discussed needs and intake with pt.   Pt states he has diabetes and checks his sugar daily every morning.  He report it has been variable lately running from 110-210.  When asked about therapeutic diet, pt states "I'm gonna eat what I want."   Pt denies nutrition-related concerns.  Pt reports he feels comfortable managing nutrition and wt.    Height: Ht Readings from Last 1 Encounters:  07/17/12 5\' 9"  (1.753 m)    Weight: Wt Readings from Last 1 Encounters:  07/18/12 208 lb 8.9 oz (94.6 kg)    Ideal Body Weight: 72.7 kg  % Ideal Body Weight: 130%  Wt Readings from Last 10 Encounters:  07/18/12 208 lb 8.9 oz (94.6 kg)    Usual Body Weight: pt unable to state, estimates some wt loss  BMI:  Body mass index is 30.80 kg/(m^2).  Estimated Nutritional Needs: Kcal: 1880-2060 Protein: 75-94g Fluid: >2.0 L/day  Skin: moderate pitting edema  Diet Order: Carb Control  EDUCATION NEEDS: -Education needs addressed   Intake/Output Summary (Last 24 hours) at 07/18/12 1344 Last data filed at 07/18/12 1300  Gross per 24 hour  Intake 1336.6 ml  Output    760 ml  Net  576.6  ml    Last BM: 1/29  Labs:   Lab 07/18/12 0142 07/17/12 1933 07/17/12 1315  NA 137 -- 137  K 4.5 -- 4.9  CL 102 -- 99  CO2 26 -- 23  BUN 42* -- 40*  CREATININE 1.81* -- 1.99*  CALCIUM 8.9 -- 9.4  MG -- 1.8 --  PHOS -- -- --  GLUCOSE 147* -- 214*    CBG (last 3)   Basename 07/18/12 1121 07/18/12 0739 07/17/12 2215  GLUCAP 194* 119* 152*    Scheduled Meds:   . albuterol  2.5 mg Nebulization Once  . antiseptic oral rinse  15 mL Mouth Rinse BID  . coumadin book   Does not apply Once  . insulin aspart  0-15 Units Subcutaneous TID WC  . ipratropium  0.5 mg Nebulization Q6H  . levalbuterol  0.63 mg Nebulization Q6H  . levofloxacin (LEVAQUIN) IV  750 mg Intravenous Q48H  . mometasone-formoterol  2 puff Inhalation BID  . pantoprazole  40 mg Oral Daily  . sodium chloride  3 mL Intravenous Q12H  . Tamsulosin HCl  0.4 mg Oral QPC supper  . warfarin  4 mg Oral ONCE-1800  . Warfarin - Pharmacist Dosing Inpatient   Does not apply q1800    Continuous Infusions:   . heparin 1,300 Units/hr (07/18/12 1300)    Past Medical History  Diagnosis Date  . COPD (  chronic obstructive pulmonary disease)   . Hypertension   . Diabetes mellitus without complication   . Anemia   . Diabetes mellitus 07/17/2012  . HTN (hypertension) 07/17/2012  . Hyperlipidemia 07/17/2012  . CKD (chronic kidney disease) stage 3, GFR 30-59 ml/min 07/17/2012  . BPH (benign prostatic hypertrophy) with urinary obstruction 07/17/2012  . Calculus of kidney 07/17/2012    Past Surgical History  Procedure Date  . Cataract extraction, bilateral Oct. 2013    bilateral eyes  . Other surgical history 33 months old    pt states "I was ruptured and had surgery."  . Eye surgery 2013    cataract bilateral extraction    Loyce Dys, MS RD LDN Clinical Inpatient Dietitian Pager: [phone] Weekend/After hours pager: [phone]

## 2012-07-18 NOTE — Progress Notes (Addendum)
ANTICOAGULATION CONSULT NOTE - Initial Consult  Pharmacy Consult for Warfarin (in addition to heparin) Indication: DVT/possible PE  No Known Allergies  Patient Measurements: Height: 5\' 9"  (175.3 cm) Weight: 208 lb 8.9 oz (94.6 kg) IBW/kg (Calculated) : 70.7  Heparin Dosing Weight: 90kg  Vital Signs: Temp: 97.8 F (36.6 C) (01/31 0405) Temp src: Oral (01/31 0405) BP: 103/64 mmHg (01/31 0800) Pulse Rate: 96  (01/31 0800)  Labs:  Basename 07/18/12 0142 07/17/12 1933 07/17/12 1317 07/17/12 1315  HGB 15.7 -- -- 16.9  HCT 47.6 -- -- 50.0  PLT 152 -- -- 155  APTT 148* -- 31 --  LABPROT 15.7* -- 13.4 --  INR 1.28 -- 1.03 --  HEPARINUNFRC 0.77* -- -- --  CREATININE 1.81* -- -- 1.99*  CKTOTAL -- -- -- --  CKMB -- -- -- --  TROPONINI <0.30 <0.30 -- <0.30    Estimated Creatinine Clearance: 40.1 ml/min (by C-G formula based on Cr of 1.81).   Medical History: Past Medical History  Diagnosis Date  . COPD (chronic obstructive pulmonary disease)   . Hypertension   . Diabetes mellitus without complication   . Anemia   . Diabetes mellitus 07/17/2012  . HTN (hypertension) 07/17/2012  . Hyperlipidemia 07/17/2012  . CKD (chronic kidney disease) stage 3, GFR 30-59 ml/min 07/17/2012  . BPH (benign prostatic hypertrophy) with urinary obstruction 07/17/2012  . Calculus of kidney 07/17/2012    Assessment: 78 YO M presents 1/30 with SOB, LE dopplers reveals LLE DVT.  Unable to obtain CT scan, getting VQ scan 1/31. D-dimer elevated on 1/30. No recent procedures, had BL cataract surgery in Oct. Wife states he bleeds easily but per medication list not on any antiplatelets or blood thinners. IV heparin started 1/30, order to start warfarin tonight. H/H, plt wnl. No bleeding events reported in chart notes.Today will be Day #1 of 5 day minimum overlap with warfarin/heparin. Will start with lower warfarin dose due to presence of potential drug interaction with Levaquin (possible increased INR  response)  Goal of Therapy:  Heparin level 0.3-0.7 units/ml Monitor platelets by anticoagulation protocol: Yes INR 2-3   Plan:   Warfarin 4mg  PO x1 at 18:00  Daily PT/INR  Warfarin education to be completed prior to discharge.   Warfarin book and video today.  HL scheduled for 11:00am today - will follow up and adjust heparin as needed based on lab result. (continue current rate for now)  Pt will need a minimum of 5 Days overlap with warfarin and heparin. Heparin to continue until 5 Day over lap complete AND INR > 2 x2 consecutive days.  Darrol Angel, PharmD Pager: [phone] 07/18/2012,8:25 AM  ADDENDUM: Heparin level is therapeutic (0.53). Will continue heparin at 1300 units/hr and recheck heparin level in 6 hours to confirm therapeutic dose. Heparin level at 17:00 tonight.  Darrol Angel, PharmD Pager: [phone] 07/18/2012 12:51 PM

## 2012-07-18 NOTE — Plan of Care (Signed)
Problem: Consults Goal: Pharmacy Consult for anticoagulation Outcome: Completed/Met Date Met:  07/18/12 Pt on heparin drip

## 2012-07-18 NOTE — Progress Notes (Signed)
PT Cancellation Note  Patient Details Name: Alexander Kirby MRN: [id number] DOB: [birth year]   Cancelled Treatment:    Reason Eval/Treat Not Completed: Patient not medically ready   Rada Hay 07/18/2012, 8:44 [phone]

## 2012-07-18 NOTE — Progress Notes (Signed)
Patient alert and oriented.  Patient able to void amber urine.  Bladder scan performed (x3) and urine was noted by the scan.  Foley catheter placed per order.

## 2012-07-18 NOTE — Progress Notes (Signed)
ANTICOAGULATION CONSULT NOTE - F/U Consult  Pharmacy Consult for heparin Indication: DVT/possible PE  No Known Allergies  Patient Measurements: Height: 5\' 9"  (175.3 cm) Weight: 205 lb 4.8 oz (93.123 kg) IBW/kg (Calculated) : 70.7  Heparin Dosing Weight: 90kg  Vital Signs: Temp: 97.8 F (36.6 C) (01/31 0000) Temp src: Oral (01/31 0000) BP: 102/70 mmHg (01/31 0000) Pulse Rate: 100  (01/31 0000)  Labs:  Basename 07/18/12 0142 07/17/12 1933 07/17/12 1317 07/17/12 1315  HGB 15.7 -- -- 16.9  HCT 47.6 -- -- 50.0  PLT 152 -- -- 155  APTT -- -- 31 --  LABPROT -- -- 13.4 --  INR -- -- 1.03 --  HEPARINUNFRC 0.77* -- -- --  CREATININE 1.81* -- -- 1.99*  CKTOTAL -- -- -- --  CKMB -- -- -- --  TROPONINI <0.30 <0.30 -- <0.30    Estimated Creatinine Clearance: 39.8 ml/min (by C-G formula based on Cr of 1.81).   Medical History: Past Medical History  Diagnosis Date  . COPD (chronic obstructive pulmonary disease)   . Hypertension   . Diabetes mellitus without complication   . Anemia   . Diabetes mellitus 07/17/2012  . HTN (hypertension) 07/17/2012  . Hyperlipidemia 07/17/2012  . CKD (chronic kidney disease) stage 3, GFR 30-59 ml/min 07/17/2012  . BPH (benign prostatic hypertrophy) with urinary obstruction 07/17/2012  . Calculus of kidney 07/17/2012    Assessment: 77 YOM presents with SOB, LE dopplers reveals L LE DVT.  D-dimer elevated. Patient having difficulty speaking in sentences without getting SOB.  NO recent procedures, had BL cataract surgery in Oct. Wife states he bleeds easily but per medication list not on any antiplatelets or blood thinners. Patient is not certain of his weight, states ~220lbs (appears accurate), states unable to stand to get on scale.  - HL = 0.77 units/ml after 4000 unit bolus and drip @ 1500 units/hr x ~ 8 hrs. -No bleeding or IV interuptions per RN.  Goal of Therapy:  Heparin level 0.3-0.7 units/ml Monitor platelets by anticoagulation protocol:  Yes   Plan:   Decrease heparin drip to 1300 units/hr.  Recheck HL in 8 hours  Await plans for long-term anticoagulation  Lorenza Evangelist 07/18/2012,2:56 AM

## 2012-07-18 NOTE — Progress Notes (Signed)
[facility] PROGRESS NOTE  WASH NIENHAUS AVW:[id number] DOB: [birth year] DOA: 07/17/2012 PCP: Ailene Ravel, MD  Assessment/Plan:  #1 acute respiratory failure Likely multifactorial in nature secondary to probable acute PE, probable community-acquired pneumonia per patchy infiltrate seen on chest x-ray with symptoms of a productive cough that have been ongoing for several weeks, in the setting of COPD. Clinical improvement per patient. Patient currently on a Venturi mask at 40%. Will check a sputum Gram stain and culture. Check a urine Legionella antigen. Check a urine pneumococcus antigen. Continue empiric IV Levaquin and IV heparin. Continue oxygen, continue nebs. Unable to get CT angiogram secondary to chronic kidney disease. Will order a VQ scan. Follow.  #2 hypoxia Likely multifactorial secondary to probable PE in a patient with left lower extremity DVT per Doppler ultrasound and probable community-acquired pneumonia per chest x-ray findings and symptoms. Patient was tachycardic on presentation which is improved. Sputum Gram stain and cultures pending. Check a urine Legionella and urine pneumococcus antigen. Continue IV heparin. Continue IV Levaquin. Continue O2. Continue nebs. Follow.  #3 left lower extremity DVT Patient was started on IV heparin in the ED and a such a hypercoagulable panel was unable to be obtained. Continue IV heparin. We'll start patient on Coumadin today.  #4 probable PE Patient had presented with severe hypoxia with sats in 80% requiring a face mask. Patient was tachycardic. Lower extremity Dopplers were positive for left lower extremity DVT. Unable to get CT angiogram secondary to renal function. Will order a VQ scan. Will check a 2-D echo to rule out right heart strain. Continue empiric IV heparin. Will start on Coumadin.  #5 COPD On presentation patient did have some wheezing however wheezing has improved significantly. ABG did show a pH of 7.32 however PCO2 was  43 and PO2 was 259. Wean oxygen. Continue empiric IV Levaquin, nebs.  #6 probable community-acquired pneumonia Patient had presented with several weeks of productive cough. Chest x-ray with patchy infiltrates. Patient also did have a leukocytosis on admission. Sputum Gram stain and cultures pending. Check a urine Legionella and urine pneumococcus antigen. Leukocytosis is trending down. Continue empiric IV Levaquin, oxygen, nebs, Robitussin as needed.  #7 acute on chronic kidney disease stage III Her PCPs records patient's last creatinine was 1.79 on 09/28/2011. On admission creatinine was 1.99. Urine studies are pending. Renal ultrasound is pending. ACE inhibitor and diuretics are on hold. Renal function has improved and close to baseline today at 1.81. Follow.  #8  Hx of anemia H&H is stable.  #9 leukocytosis Likely reactive secondary to probable PE versus probable community-acquired pneumonia. Sputum Gram stain and culture pending. Urinalysis is unremarkable. Chest x-ray with patchy infiltrates. Will check a urine Legionella antigen. Check a urine pneumococcus antigen. Leukocytosis is trending down. Continue empiric IV Levaquin.  #10 diabetes mellitus Hemoglobin A1c is 8.4. CBGs have ranged from 152-204. Continue sliding scale insulin.  #11 hypertension Stable. Continue to hold patient's ACE inhibitor and diuretics secondary to problem #7. Follow.  #12 prophylaxis PPI for GI prophylaxis. On full dose heparin for DVT.   Code Status: Full Family Communication: Updated patient. No family at bedtime. Disposition Plan: Home when medically stable   Consultants:  None  Procedures:  Chest x-ray 07/17/2012  Lower extremity Dopplers 07/17/2012  Antibiotics:  IV Levaquin 07/17/2012  HPI/Subjective: Patient states breathing has improved from admission.  Objective: Filed Vitals:   07/18/12 0300 07/18/12 0405 07/18/12 0600 07/18/12 0640  BP:  111/63 111/72   Pulse:  99 86  90   Temp:  97.8 F (36.6 C)    TempSrc:  Oral    Resp:  27 24 28   Height:      Weight: 94.6 kg (208 lb 8.9 oz)     SpO2:  98% 98% 97%    Intake/Output Summary (Last 24 hours) at 07/18/12 0747 Last data filed at 07/18/12 0600  Gross per 24 hour  Intake  825.6 ml  Output    550 ml  Net  275.6 ml   Filed Weights   07/17/12 1521 07/18/12 0300  Weight: 93.123 kg (205 lb 4.8 oz) 94.6 kg (208 lb 8.9 oz)    Exam:   General:  NAD. Sitting upright in bed  Cardiovascular: RRR no m/r/g. Trace to 1 + B LE edem  Respiratory: Min exp wheezing.  Abdomen: Soft/NT/ND/+BS  Data Reviewed: Basic Metabolic Panel:  Lab 07/18/12 4540 07/17/12 1933 07/17/12 1315  NA 137 -- 137  K 4.5 -- 4.9  CL 102 -- 99  CO2 26 -- 23  GLUCOSE 147* -- 214*  BUN 42* -- 40*  CREATININE 1.81* -- 1.99*  CALCIUM 8.9 -- 9.4  MG -- 1.8 --  PHOS -- -- --   Liver Function Tests:  Lab 07/17/12 1315  AST 18  ALT 21  ALKPHOS 116  BILITOT 1.1  PROT 6.4  ALBUMIN 2.7*   No results found for this basename: LIPASE:5,AMYLASE:5 in the last 168 hours No results found for this basename: AMMONIA:5 in the last 168 hours CBC:  Lab 07/18/12 0142 07/17/12 1315  WBC 13.9* 19.4*  NEUTROABS -- 17.2*  HGB 15.7 16.9  HCT 47.6 50.0  MCV 95.6 95.1  PLT 152 155   Cardiac Enzymes:  Lab 07/18/12 0142 07/17/12 1933 07/17/12 1315  CKTOTAL -- -- --  CKMB -- -- --  CKMBINDEX -- -- --  TROPONINI <0.30 <0.30 <0.30   BNP (last 3 results)  Basename 07/17/12 1315  PROBNP 1312.0*   CBG:  Lab 07/17/12 2215 07/17/12 1930 07/17/12 1205  GLUCAP 152* 153* 204*    Recent Results (from the past 240 hour(s))  MRSA PCR SCREENING     Status: Normal   Collection Time   07/17/12  6:46 PM      Component Value Range Status Comment   MRSA by PCR NEGATIVE  NEGATIVE Final      Studies: Dg Chest 2 View  07/17/2012  *RADIOLOGY REPORT*  Clinical Data: History of dyspnea and COPD.  CHEST - 2 VIEW  Comparison: None.  Findings:  There is mild enlargement cardiac silhouette.  There is rounded prominence of the main pulmonary artery area. Ectasia and nonaneurysmal calcification of the thoracic aorta are seen.  Patchy infiltrative densities and atelectasis are seen in the right lung base. Haziness and increased markings are seen in the left lung base.  This may be associated with epicardial fat pad.  There is flattening of the low-lying diaphragm on lateral image consistent with overall hyperinflation configuration and COPD.  There is osteopenic appearance of bones.  Changes of degenerative disc disease and degenerative spondylosis are present.  IMPRESSION: Overall hyperinflation configuration consistent with COPD. Patchy infiltrative densities and atelectasis are seen in the right lung base. Haziness and increased markings are seen in the left lung base.  This may be associated with epicardial fat pad. Chronic findings described above.   Original Report Authenticated By: Onalee Hua Call     Scheduled Meds:   . sodium chloride   Intravenous STAT  . albuterol  2.5 mg Nebulization Once  . antiseptic oral rinse  15 mL Mouth Rinse BID  . insulin aspart  0-15 Units Subcutaneous TID WC  . ipratropium  0.5 mg Nebulization Q6H  . levalbuterol  0.63 mg Nebulization Q6H  . levofloxacin (LEVAQUIN) IV  750 mg Intravenous Q48H  . mometasone-formoterol  2 puff Inhalation BID  . sodium chloride  3 mL Intravenous Q12H  . sodium chloride  3 mL Intravenous Q12H  . Tamsulosin HCl  0.4 mg Oral QPC supper   Continuous Infusions:   . heparin 1,300 Units/hr (07/18/12 0258)    Principal Problem:  *Acute respiratory failure Active Problems:  DVT (deep venous thrombosis)  Leukocytosis  Acute on chronic renal failure  Hypoxia  COPD (chronic obstructive pulmonary disease)  Diabetes mellitus  HTN (hypertension)  Anemia  Hyperlipidemia  CKD (chronic kidney disease) stage 3, GFR 30-59 ml/min  BPH (benign prostatic hypertrophy) with urinary  obstruction  Calculus of kidney  CAP (community acquired pneumonia)    Time spent: > 35 mins    [facility]  [facility] Pager [phone]. If 8PM-8AM, please contact night-coverage at [email or web address], password [facility] 07/18/2012, 7:47 AM  LOS: 1 day

## 2012-07-18 NOTE — Progress Notes (Signed)
Brief Pharmacy Update:   Pt has been on IV heparin, now being bridged with Coumadin for DVT/possible PE.  Confirmation HL currently is supratherapeutic @ 0.74.  Heparin running at 1300 units/hr.  Spoke with RN - no issues with infusion.  No bleeding reported.    Plan:  Reduce heparin infusion rate to 1200 units/hr = 12 ml/hr.  Follow-up heparin level with AM labs.    Haynes Hoehn, PharmD 07/18/2012 6:00 PM  Pager: [phone]

## 2012-07-18 NOTE — Progress Notes (Signed)
OT Cancellation Note  Patient Details Name: Alexander Kirby MRN: [id number] DOB: 16-Mar-1935   Cancelled Treatment:    Reason Eval/Treat Not Completed: Medical issues which prohibited therapy (MD asked therapy to hold off until tomorrow.)  Eldred Sooy A OTR/L [phone] 07/18/2012, 9:29 AM

## 2012-07-19 DIAGNOSIS — R0902 Hypoxemia: Secondary | ICD-10-CM | POA: Diagnosis not present

## 2012-07-19 DIAGNOSIS — J189 Pneumonia, unspecified organism: Secondary | ICD-10-CM | POA: Diagnosis not present

## 2012-07-19 DIAGNOSIS — I82409 Acute embolism and thrombosis of unspecified deep veins of unspecified lower extremity: Secondary | ICD-10-CM | POA: Diagnosis not present

## 2012-07-19 DIAGNOSIS — J9600 Acute respiratory failure, unspecified whether with hypoxia or hypercapnia: Secondary | ICD-10-CM | POA: Diagnosis not present

## 2012-07-19 LAB — BASIC METABOLIC PANEL
BUN: 44 mg/dL — ABNORMAL HIGH (ref 6–23)
Calcium: 8.9 mg/dL (ref 8.4–10.5)
Chloride: 102 mEq/L (ref 96–112)
Creatinine, Ser: 1.83 mg/dL — ABNORMAL HIGH (ref 0.50–1.35)
GFR calc Af Amer: 40 mL/min — ABNORMAL LOW (ref >90)

## 2012-07-19 LAB — CBC
HCT: 46.1 % (ref 39.0–52.0)
MCH: 30.8 pg (ref 26.0–34.0)
MCV: 95.4 fL (ref 78.0–100.0)
Platelets: 165 10*3/uL (ref 150–400)
RDW: 13.5 % (ref 11.5–15.5)
WBC: 12.5 10*3/uL — ABNORMAL HIGH (ref 4.0–10.5)

## 2012-07-19 LAB — LEGIONELLA ANTIGEN, URINE

## 2012-07-19 LAB — GLUCOSE, CAPILLARY: Glucose-Capillary: 202 mg/dL — ABNORMAL HIGH (ref 70–99)

## 2012-07-19 LAB — HEPARIN LEVEL (UNFRACTIONATED): Heparin Unfractionated: 0.77 IU/mL — ABNORMAL HIGH (ref 0.30–0.70)

## 2012-07-19 LAB — PROTIME-INR: Prothrombin Time: 14.9 seconds (ref 11.6–15.2)

## 2012-07-19 MED ORDER — HEPARIN (PORCINE) IN NACL 100-0.45 UNIT/ML-% IJ SOLN
1000.0000 [IU]/h | INTRAMUSCULAR | Status: AC
  Administered 2012-07-19: 1000 [IU]/h via INTRAVENOUS
  Filled 2012-07-19: qty 250

## 2012-07-19 MED ORDER — LEVOFLOXACIN 750 MG PO TABS
750.0000 mg | ORAL_TABLET | ORAL | Status: DC
Start: 2012-07-19 — End: 2012-07-23
  Administered 2012-07-19 – 2012-07-21 (×2): 750 mg via ORAL
  Filled 2012-07-19 (×3): qty 1

## 2012-07-19 MED ORDER — METHYLPREDNISOLONE SODIUM SUCC 125 MG IJ SOLR
60.0000 mg | Freq: Once | INTRAMUSCULAR | Status: AC
  Administered 2012-07-19: 60 mg via INTRAVENOUS
  Filled 2012-07-19: qty 0.96

## 2012-07-19 MED ORDER — WARFARIN SODIUM 4 MG PO TABS
4.0000 mg | ORAL_TABLET | Freq: Once | ORAL | Status: AC
  Administered 2012-07-19: 4 mg via ORAL
  Filled 2012-07-19: qty 1

## 2012-07-19 MED ORDER — HEPARIN (PORCINE) IN NACL 100-0.45 UNIT/ML-% IJ SOLN
700.0000 [IU]/h | INTRAMUSCULAR | Status: DC
  Administered 2012-07-20: 700 [IU]/h via INTRAVENOUS
  Filled 2012-07-19 (×3): qty 250

## 2012-07-19 MED ORDER — GUAIFENESIN ER 600 MG PO TB12
1200.0000 mg | ORAL_TABLET | Freq: Two times a day (BID) | ORAL | Status: DC
  Administered 2012-07-19 – 2012-08-04 (×32): 1200 mg via ORAL
  Filled 2012-07-19 (×34): qty 2

## 2012-07-19 NOTE — Progress Notes (Signed)
ANTICOAGULATION CONSULT NOTE - Follow Up  Pharmacy Consult for Warfarin and Heparin Indication: DVT/possible PE  No Known Allergies  Patient Measurements: Height: 5\' 9"  (175.3 cm) Weight: 210 lb 12.2 oz (95.6 kg) IBW/kg (Calculated) : 70.7  Heparin Dosing Weight: 90kg  Vital Signs: Temp: 97.9 F (36.6 C) (02/01 0800) Temp src: Oral (02/01 0800) BP: 123/90 mmHg (02/01 1300) Pulse Rate: 110  (02/01 1300)  Labs:  Basename 07/19/12 1245 07/19/12 0340 07/18/12 1645 07/18/12 0725 07/18/12 0142 07/17/12 1933 07/17/12 1317 07/17/12 1315  HGB -- 14.9 -- -- 15.7 -- -- --  HCT -- 46.1 -- -- 47.6 -- -- 50.0  PLT -- 165 -- -- 152 -- -- 155  APTT -- -- -- -- 148* -- 31 --  LABPROT -- 14.9 -- -- 15.7* -- 13.4 --  INR -- 1.19 -- -- 1.28 -- 1.03 --  HEPARINUNFRC 0.79* 0.77* 0.74* -- -- -- -- --  CREATININE -- 1.83* -- -- 1.81* -- -- 1.99*  CKTOTAL -- -- -- -- -- -- -- --  CKMB -- -- -- -- -- -- -- --  TROPONINI -- -- -- <0.30 <0.30 <0.30 -- --    Estimated Creatinine Clearance: 39.8 ml/min (by C-G formula based on Cr of 1.83).  Assessment: 78 YO M presents 1/30 with SOB, LE dopplers reveals LLE DVT.  Unable to obtain CT scan, getting VQ scan 1/31. D-dimer elevated on 1/30. No recent procedures, had BL cataract surgery in Oct. Wife states he bleeds easily but per medication list not on any antiplatelets or blood thinners. IV heparin started 1/30, warfarin started 1/31. Today is Day #2 of 5 day minimum overlap with warfarin/heparin. Using lower warfarin dose due to presence of potential drug interaction with Levaquin (possible increased INR response)  Heparin level remains supratherapeutic despite dose decreases. Confirmed with RN that there is no signs of bleeding and that the heparin is infusing at 67ml/hr. RN reports that there was a problem with the IV heparin line, so the infusion was stopped for approx 30 min so the line could be fixed. This occurred appox 1-2 hours prior to HL being  drawn. Because of this info, will lower heparin rate even more (to 700 units/hr) since the infusion was interrupted and the level still was high.  INR remains subtherapeutic as expected after only 1 dose of warfarin. Using lower dosing initially due to drug interaction with Levaquin (possible increase in INR).  H/H, plts wnl  Goal of Therapy:  Heparin level 0.3-0.7 units/ml Monitor platelets by anticoagulation protocol: Yes INR 2-3   Plan:   Decrease heparin to 700 units/hr (7 ml/hr)  Recheck heparin level in 6 hours.  Warfarin 4mg  PO x1 at 18:00  Daily PT/INR  Warfarin education to be completed prior to discharge.   Pt will need a minimum of 5 Days overlap with warfarin and heparin. Heparin to continue until 5 Day over lap complete AND INR > 2 x2 consecutive days.  Darrol Angel, PharmD Pager: [phone] 07/19/2012,2:05 PM

## 2012-07-19 NOTE — Progress Notes (Signed)
ANTICOAGULATION CONSULT NOTE - Follow Up  Pharmacy Consult for Warfarin and Heparin Indication: DVT/possible PE  No Known Allergies  Patient Measurements: Height: 5\' 9"  (175.3 cm) Weight: 210 lb 12.2 oz (95.6 kg) IBW/kg (Calculated) : 70.7  Heparin Dosing Weight: 90kg  Vital Signs: Temp: 97.5 F (36.4 C) (02/01 1600) Temp src: Oral (02/01 1600) BP: 127/76 mmHg (02/01 2020) Pulse Rate: 101  (02/01 2020)  Labs:  Basename 07/19/12 2005 07/19/12 1245 07/19/12 0340 07/18/12 0725 07/18/12 0142 07/17/12 1933 07/17/12 1317 07/17/12 1315  HGB -- -- 14.9 -- 15.7 -- -- --  HCT -- -- 46.1 -- 47.6 -- -- 50.0  PLT -- -- 165 -- 152 -- -- 155  APTT -- -- -- -- 148* -- 31 --  LABPROT -- -- 14.9 -- 15.7* -- 13.4 --  INR -- -- 1.19 -- 1.28 -- 1.03 --  HEPARINUNFRC 0.36 0.79* 0.77* -- -- -- -- --  CREATININE -- -- 1.83* -- 1.81* -- -- 1.99*  CKTOTAL -- -- -- -- -- -- -- --  CKMB -- -- -- -- -- -- -- --  TROPONINI -- -- -- <0.30 <0.30 <0.30 -- --    Estimated Creatinine Clearance: 39.8 ml/min (by C-G formula based on Cr of 1.83).  Assessment: 78 YO M presents 1/30 with SOB, LE dopplers reveals LLE DVT.  Unable to obtain CT scan, getting VQ scan 1/31. D-dimer elevated on 1/30. No recent procedures, had BL cataract surgery in Oct. Wife states he bleeds easily but per medication list not on any antiplatelets or blood thinners. IV heparin started 1/30, warfarin started 1/31. Today is Day #2 of 5 day minimum overlap with warfarin/heparin. Using lower warfarin dose due to presence of potential drug interaction with Levaquin (possible increased INR response)  Heparin level recheck following rate decrease= 0.36 following supratherapeutic levels x 3 despite dose decreases.   INR remains subtherapeutic as expected after only 1 dose of warfarin. Using lower dosing initially due to drug interaction with Levaquin (possible increase in INR).  H/H, plts wnl  Goal of Therapy:  Heparin level 0.3-0.7  units/ml Monitor platelets by anticoagulation protocol: Yes INR 2-3   Plan:   Continue heparin at 700 units/hr (7 ml/hr)  Follow-up am labs  Pt will needs a minimum of 5 Days overlap with warfarin and heparin. Heparin to continue until 5 Day over lap complete AND INR > 2 x2 consecutive days.  Juliette Alcide, PharmD, BCPS.   Pager: [phone] 07/19/2012,8:32 PM

## 2012-07-19 NOTE — Evaluation (Signed)
Occupational Therapy Evaluation Patient Details Name: Alexander Kirby MRN: [id number] DOB: August 16, 1934 Today's Date: 07/19/2012 Time: 7829-5621 OT Time Calculation (min): 26 min  OT Assessment / Plan / Recommendation Clinical Impression  This 78 year old man was admitted for acute respiratory failure.  He has a h/o copd, has a dvt and possible pna and  PE.  Pt is appropriate for skilled OT to increase independence wiith adls to reach a supervision to min A level in acute    OT Assessment  Patient needs continued OT Services    Follow Up Recommendations  No OT follow up (vs home health depending on progress)    Barriers to Discharge      Equipment Recommendations  None recommended by OT    Recommendations for Other Services    Frequency  Min 2X/week    Precautions / Restrictions Precautions Precaution Comments: lines, O2   Pertinent Vitals/Pain No pain.  Sats 87- 91 on ventimask; HR 115    ADL  Transfers/Ambulation Related to ADLs: Pt moves well:  min guard to min A. Pt slightly unsteady but no LOB.  Anxious.   ADL Comments: Any movement makes pt cough.  He is very limited with adls right now due to breathing.  Overall max  A for UB ADLs/ LB bathing and total A x 1 for LB dressing:  Tolerating very little.  Pt paced hiimself and initiated rest breaks.      Pt is used to being very independent    OT Diagnosis: Generalized weakness  OT Problem List: Cardiopulmonary status limiting activity;Decreased strength;Decreased activity tolerance;Impaired balance (sitting and/or standing);Decreased safety awareness;Decreased knowledge of use of DME or AE OT Treatment Interventions: Self-care/ADL training;DME and/or AE instruction;Therapeutic activities;Patient/family education;Balance training;Energy conservation   OT Goals Acute Rehab OT Goals OT Goal Formulation: With patient/family Time For Goal Achievement: 08/02/12 Potential to Achieve Goals: Good ADL Goals Pt Will Perform Grooming: with  supervision;Standing at sink ADL Goal: Grooming - Progress: Goal set today Pt Will Transfer to Toilet: with supervision;Ambulation;3-in-1 ADL Goal: Toilet Transfer - Progress: Goal set today Miscellaneous OT Goals Miscellaneous OT Goal #1: Pt will perform adl routine with set up  except min A for LB dressing, sit to stand OT Goal: Miscellaneous Goal #1 - Progress: Goal set today Miscellaneous OT Goal #2: Pt will demonstrate pursed lip breathing when dyspnea present without cues OT Goal: Miscellaneous Goal #2 - Progress: Goal set today  Visit Information  Last OT Received On: 07/19/12 Assistance Needed: +2 (lines) PT/OT Co-Evaluation/Treatment: Yes    Subjective Data  Subjective: i just need to do this my own way at my own speed Patient Stated Goal: get back to being independent   Prior Functioning     Home Living Lives With: Spouse Available Help at Discharge: Family Type of Home: House Home Access: Stairs to enter [employer] of Steps: 2 Home Layout: One level Bathroom Shower/Tub: Walk-in shower;Door [employer] Toilet: Standard Home Adaptive Equipment: Bedside commode/3-in-1;Walker - rolling;Shower chair without back Additional Comments: wife works Prior Function Level of Independence: Independent Able to Take Stairs?: Yes Driving: Yes Communication Communication: No difficulties         Vision/Perception     Cognition  Overall Cognitive Status: Impaired (mostly, cues for safety:  pt is used to being I) Area of Impairment: Safety/judgement;Awareness of deficits Arousal/Alertness: Awake/alert Orientation Level: Appears intact for tasks assessed Behavior During Session: Anxious Safety/Judgement - Other Comments: pt requiring cues for safety;      Extremity/Trunk Assessment  Right Upper Extremity Assessment RUE ROM/Strength/Tone: WFL for tasks assessed (moved bil UEs to 90; strength grossly 4-/5) Left Upper Extremity Assessment LUE ROM/Strength/Tone:  WFL for tasks assessed     Mobility Bed Mobility Bed Mobility: Supine to Sit Supine to Sit: 4: Min assist Details for Bed Mobility Assistance: supervision for safety Transfers Sit to Stand: 4: Min guard;From bed Stand to Sit: 4: Min guard;4: Min assist;To chair/3-in-1 Details for Transfer Assistance: +2 on eval for safety and lines; pt moves well but requires supervision for safety and to monitor vitals and keep pt from doing too much     Shoulder Instructions     Exercise     Balance     End of Session OT - End of Session Activity Tolerance: Patient limited by fatigue;Treatment limited secondary to medical complications (Comment) (recovery time for sats to increase, dyspnea to decrease) Patient left: in chair;with call bell/phone within reach;with family/visitor present Nurse Communication: Mobility status (and had problem with IV leaking from pump)  GO     Alexander Kirby 07/19/2012, 12:33 PM Marica Otter, OTR/L [phone] 07/19/2012

## 2012-07-19 NOTE — Progress Notes (Signed)
PHARMACIST - PHYSICIAN COMMUNICATION DR:   [facility] CONCERNING: Antibiotic IV to Oral Route Change Policy  RECOMMENDATION: This patient is receiving levaquin by the intravenous route.  Based on criteria approved by the Pharmacy and Therapeutics Committee, the antibiotic(s) is/are being converted to the equivalent oral dose form(s).   DESCRIPTION: These criteria include:  Patient being treated for a respiratory tract infection, urinary tract infection, or cellulitis  The patient is not neutropenic and does not exhibit a GI malabsorption state  The patient is eating (either orally or via tube) and/or has been taking other orally administered medications for a least 24 hours  The patient is improving clinically and has a Tmax < 100.5  If you have questions about this conversion, please contact the Pharmacy Department  []   [phone] )  [facility] []   [phone] )  [facility]  []   [phone] )  [facility] [x]   [phone])  Alexander Kirby    Alexander Kirby, PharmD, BCPS.   Pager: [phone] 07/19/2012 7:22 PM

## 2012-07-19 NOTE — Evaluation (Signed)
Physical Therapy Evaluation Patient Details Name: Alexander Kirby MRN: [id number] DOB: December 19, 1934 Today's Date: 07/19/2012 Time: 4098-1191 PT Time Calculation (min): 26 min  PT Assessment / Plan / Recommendation Clinical Impression  This 78 year old man was admitted for acute respiratory failure.  He has a h/o copd, has a DVT and possible pna and  PE.  Pt  will benefit from PT to maximize mobility to improve independence for home setting    PT Assessment  Patient needs continued PT services    Follow Up Recommendations  Home health PT;No PT follow up (vs)    Does the patient have the potential to tolerate intense rehabilitation      Barriers to Discharge        Equipment Recommendations  Other (comment) (TBA)    Recommendations for Other Services     Frequency Min 3X/week    Precautions / Restrictions Precautions Precaution Comments: lines, O2   Pertinent Vitals/Pain HR 109-116 Sats 88-94% on Venti-mask with 6L RR26-34      Mobility  Bed Mobility Bed Mobility: Supine to Sit Supine to Sit: 4: Min assist Details for Bed Mobility Assistance: supervision for safety Transfers Transfers: Sit to Stand;Stand to Sit;Stand Pivot Transfers Sit to Stand: 4: Min guard;From bed Stand to Sit: 4: Min guard;4: Min assist;To chair/3-in-1 Stand Pivot Transfers: 4: Min guard;4: Min assist Details for Transfer Assistance: +2 on eval for safety and lines; pt moves well but requires supervision for safety and to monitor vitals and keep pt from doing too much    Shoulder Instructions     Exercises     PT Diagnosis: Generalized weakness  PT Problem List: Decreased activity tolerance;Decreased balance;Decreased mobility;Decreased safety awareness;Decreased knowledge of precautions;Cardiopulmonary status limiting activity PT Treatment Interventions: DME instruction;Gait training;Functional mobility training;Therapeutic activities;Therapeutic exercise;Balance training;Patient/family education    PT Goals Acute Rehab PT Goals PT Goal Formulation: With patient Time For Goal Achievement: 08/02/12 Potential to Achieve Goals: Fair Pt will go Supine/Side to Sit: with modified independence PT Goal: Supine/Side to Sit - Progress: Goal set today Pt will go Sit to Supine/Side: with modified independence PT Goal: Sit to Supine/Side - Progress: Goal set today Pt will go Sit to Stand: with modified independence PT Goal: Sit to Stand - Progress: Goal set today Pt will go Stand to Sit: with modified independence PT Goal: Stand to Sit - Progress: Goal set today Pt will Ambulate: 1 - 15 feet;with least restrictive assistive device;with supervision (sats >90%) PT Goal: Ambulate - Progress: Goal set today  Visit Information  Last PT Received On: 07/19/12 Assistance Needed: +2 PT/OT Co-Evaluation/Treatment: Yes    Subjective Data  Subjective: I don't have any answers Patient Stated Goal: home   Prior Functioning  Home Living Lives With: Spouse Available Help at Discharge: Family Type of Home: House Home Access: Stairs to enter Secretary/administrator of Steps: 2 Home Layout: One level Bathroom Shower/Tub: Walk-in shower;Door [employer] Toilet: Standard Home Adaptive Equipment: Bedside commode/3-in-1;Walker - rolling;Shower chair without back Additional Comments: wife works Prior Function Level of Independence: Independent Able to Take Stairs?: Yes Driving: Yes Communication Communication: No difficulties    Cognition  Overall Cognitive Status: Impaired (mostly, cues for safety:  pt is used to being I) Area of Impairment: Safety/judgement;Awareness of deficits Arousal/Alertness: Awake/alert Orientation Level: Appears intact for tasks assessed Behavior During Session: Anxious Safety/Judgement - Other Comments: pt requiring cues for safety;  Awareness of Deficits: pt does not appear to fully understand the ramifications of his dx until  the RN explained to him that he would die  if his clot moved    Extremity/Trunk Assessment Right Upper Extremity Assessment RUE ROM/Strength/Tone: [facility] for tasks assessed (moved bil UEs to 90; strength grossly 4-/5) Left Upper Extremity Assessment LUE ROM/Strength/Tone: [facility] for tasks assessed Right Lower Extremity Assessment RLE ROM/Strength/Tone: [facility] for tasks assessed Left Lower Extremity Assessment LLE ROM/Strength/Tone: [facility] for tasks assessed   Balance Static Sitting Balance Static Sitting - Balance Support: Bilateral upper extremity supported;Feet supported Static Sitting - Level of Assistance: 5: Stand by assistance  End of Session PT - End of Session Activity Tolerance: Patient limited by fatigue;Treatment limited secondary to medical complications (Comment) Patient left: in chair;with call bell/phone within reach;with nursing in room;with family/visitor present  GP     [facility] 07/19/2012, 1:50 PM

## 2012-07-19 NOTE — Progress Notes (Signed)
Brief Pharmacy Update:   Pt has been on IV heparin, now being bridged with Coumadin for DVT/possible PE.  HL remains elevated (0.77) despite decrease in heparin rate to 1200 units/hr.  No complications of therapy noted.    Plan:  Reduce heparin infusion rate to 1000 units/hr = 10 ml/hr.  Follow-up heparin level in 8 hr    Terrilee Files, PharmD 07/19/2012 4:48 AM

## 2012-07-19 NOTE — Progress Notes (Signed)
[facility] PROGRESS NOTE  Alexander Kirby:[id number] DOB: 1934/08/03 DOA: 07/17/2012 PCP: Ailene Ravel, MD  Assessment/Plan:  #1 acute respiratory failure Likely multifactorial in nature secondary to probable acute PE, probable community-acquired pneumonia per patchy infiltrate seen on chest x-ray with symptoms of a productive cough that have been ongoing for several weeks, in the setting of COPD. Clinical improvement per patient. Patient currently on a Venturi mask at 40%. Sputum Gram stain and culture pending. Urine Legionella antigen pending. Urine pneumococcus antigen negative. Continue empiric IV Levaquin and IV heparin and coumadin. Continue oxygen, continue nebs. Unable to get CT angiogram secondary to chronic kidney disease. Unable to get VQ scan as patient can't lay flat. Follow.  #2 hypoxia Likely multifactorial secondary to probable PE in a patient with left lower extremity DVT per Doppler ultrasound in the setting of probable community-acquired pneumonia per chest x-ray findings and symptoms. Patient was tachycardic on presentation which is improved. Sputum Gram stain and cultures pending. Urine Legionella antigen pending. Urine pneumococcus antigen. Continue IV heparin. Continue coumadin. Continue IV Levaquin. Continue O2. Continue nebs. Follow.  #3 left lower extremity DVT Patient was started on IV heparin in the ED and a such a hypercoagulable panel was unable to be obtained. Continue IV heparin. Coumadin was started yesterday.   #4 probable PE Patient had presented with severe hypoxia with sats in 80% requiring a face mask. Patient was tachycardic. Lower extremity Dopplers were positive for left lower extremity DVT. Unable to get CT angiogram secondary to renal function. Heparin was started in the emergency room and a such unable to obtain a hypercoagulable panel. Unable to get VQ scan as patient could not lay flat. 2-D echo with EF of 65-70%. Right ventricle was mildly to  moderately dilated. Intraventricular septum was D-shaped with evidence for right ventricular pressure/volume overload which could be consistent with a large PE. Patient did have a slight drop in his blood pressure overnight with systolic in the 70s. Current blood pressure systolic is 117. Monitor blood pressure closely for hypotension and if patient develops hypotension will need IV fluids and critical care consult. Continue empiric IV heparin. Coumadin was started yesterday.   #5 COPD On presentation patient did have some wheezing however wheezing has improved significantly. ABG did show a pH of 7.32 however PCO2 was 43 and PO2 was 259. Wean oxygen. Continue empiric IV Levaquin, nebs. Will give a one-time dose of Solu-Medrol 60 mg IV x1.  #6 probable community-acquired pneumonia Patient had presented with several weeks of productive cough. Chest x-ray with patchy infiltrates. Patient also did have a leukocytosis on admission. Sputum Gram stain and cultures pending. Urine Legionella antigen pending. Urine pneumococcus antigen negative. Leukocytosis is trending down. Continue empiric IV Levaquin, oxygen, nebs, Robitussin as needed.  #7 acute on chronic kidney disease stage III Her PCPs records patient's last creatinine was 1.79 on 09/28/2011. On admission creatinine was 1.99.  Renal ultrasound with slight thinning of the renal cortices. No acute abnormalities.  ACE inhibitor and diuretics are on hold. Renal function has improved and close to baseline today at 1.83. Follow.  #8  Hx of anemia H&H is stable.  #9 leukocytosis Likely reactive secondary to probable PE and probable community-acquired pneumonia. Sputum Gram stain and culture pending. Urinalysis is unremarkable. Chest x-ray with patchy infiltrates. Urine Legionella antigen pending. Urine pneumococcus antigen negative. Leukocytosis is trending down. Continue empiric IV Levaquin.  #10 diabetes mellitus Hemoglobin A1c is 8.4. CBGs have ranged  from 119-205. Continue sliding scale  insulin.  #11 hypertension Stable. Continue to hold patient's ACE inhibitor and diuretics secondary to problem #7. Follow.  #12 prophylaxis PPI for GI prophylaxis. On full dose heparin for DVT.   Code Status: Full Family Communication: Updated patient. No family at bedtime. Disposition Plan: Home when medically stable   Consultants:  None  Procedures:  Chest x-ray 07/17/2012  Lower extremity Dopplers 07/17/2012  2-D echo 07/18/2012  Renal ultrasound 07/18/2012  Antibiotics:  IV Levaquin 07/17/2012  HPI/Subjective: Patient sleeping but easily arousable. Breathing is improved as patient is now on 4 L nasal cannula with sats above 92%.  Objective: Filed Vitals:   07/19/12 0238 07/19/12 0400 07/19/12 0405 07/19/12 0600  BP:  74/59 121/69 99/63  Pulse:  88 89 76  Temp:  97.9 F (36.6 C) 97.4 F (36.3 C)   TempSrc:  Oral    Resp:  25 23 23   Height:      Weight:  95.6 kg (210 lb 12.2 oz)    SpO2: 94% 93% 92% 95%    Intake/Output Summary (Last 24 hours) at 07/19/12 0803 Last data filed at 07/19/12 0700  Gross per 24 hour  Intake 780.73 ml  Output    915 ml  Net -134.27 ml   Filed Weights   07/17/12 1521 07/18/12 0300 07/19/12 0400  Weight: 93.123 kg (205 lb 4.8 oz) 94.6 kg (208 lb 8.9 oz) 95.6 kg (210 lb 12.2 oz)    Exam:   General:  NAD. Sleeping upright in bed  Cardiovascular: RRR no m/r/g. Trace to 1 + B R > L LE edem  Respiratory: Min exp wheezing.  Abdomen: Soft/NT/ND/+BS  Data Reviewed: Basic Metabolic Panel:  Lab 07/19/12 0865 07/18/12 0142 07/17/12 1933 07/17/12 1315  NA 136 137 -- 137  K 4.2 4.5 -- 4.9  CL 102 102 -- 99  CO2 25 26 -- 23  GLUCOSE 144* 147* -- 214*  BUN 44* 42* -- 40*  CREATININE 1.83* 1.81* -- 1.99*  CALCIUM 8.9 8.9 -- 9.4  MG -- -- 1.8 --  PHOS -- -- -- --   Liver Function Tests:  Lab 07/17/12 1315  AST 18  ALT 21  ALKPHOS 116  BILITOT 1.1  PROT 6.4  ALBUMIN 2.7*    No results found for this basename: LIPASE:5,AMYLASE:5 in the last 168 hours No results found for this basename: AMMONIA:5 in the last 168 hours CBC:  Lab 07/19/12 0340 07/18/12 0142 07/17/12 1315  WBC 12.5* 13.9* 19.4*  NEUTROABS -- -- 17.2*  HGB 14.9 15.7 16.9  HCT 46.1 47.6 50.0  MCV 95.4 95.6 95.1  PLT 165 152 155   Cardiac Enzymes:  Lab 07/18/12 0725 07/18/12 0142 07/17/12 1933 07/17/12 1315  CKTOTAL -- -- -- --  CKMB -- -- -- --  CKMBINDEX -- -- -- --  TROPONINI <0.30 <0.30 <0.30 <0.30   BNP (last 3 results)  Basename 07/17/12 1315  PROBNP 1312.0*   CBG:  Lab 07/18/12 2143 07/18/12 1744 07/18/12 1121 07/18/12 0739 07/17/12 2215  GLUCAP 205* 119* 194* 119* 152*    Recent Results (from the past 240 hour(s))  MRSA PCR SCREENING     Status: Normal   Collection Time   07/17/12  6:46 PM      Component Value Range Status Comment   MRSA by PCR NEGATIVE  NEGATIVE Final   URINE CULTURE     Status: Normal (Preliminary result)   Collection Time   07/17/12 11:07 PM      Component Value  Range Status Comment   Specimen Description URINE, CLEAN CATCH   Final    Special Requests NONE   Final    Culture  Setup Time 07/18/2012 09:18   Final    Colony Count PENDING   Incomplete    Culture Culture reincubated for better growth   Final    Report Status PENDING   Incomplete      Studies: Dg Chest 2 View  07/17/2012  *RADIOLOGY REPORT*  Clinical Data: History of dyspnea and COPD.  CHEST - 2 VIEW  Comparison: None.  Findings: There is mild enlargement cardiac silhouette.  There is rounded prominence of the main pulmonary artery area. Ectasia and nonaneurysmal calcification of the thoracic aorta are seen.  Patchy infiltrative densities and atelectasis are seen in the right lung base. Haziness and increased markings are seen in the left lung base.  This may be associated with epicardial fat pad.  There is flattening of the low-lying diaphragm on lateral image consistent with overall  hyperinflation configuration and COPD.  There is osteopenic appearance of bones.  Changes of degenerative disc disease and degenerative spondylosis are present.  IMPRESSION: Overall hyperinflation configuration consistent with COPD. Patchy infiltrative densities and atelectasis are seen in the right lung base. Haziness and increased markings are seen in the left lung base.  This may be associated with epicardial fat pad. Chronic findings described above.   Original Report Authenticated By: Onalee Hua Call    US Renal  07/18/2012  *RADIOLOGY REPORT*  Clinical Data: Acute renal failure.  RENAL/URINARY TRACT ULTRASOUND COMPLETE  Comparison:  None.  Findings:  Right Kidney:  10.2 cm in length.  Diffuse thinning of the renal cortex.  No hydronephrosis.  Left Kidney:  10.4 cm in length.  Diffuse slight thinning of the renal cortex.  1.3 cm simple appearing cyst in the inferior medial aspect of the kidney.  No hydronephrosis.  Bladder:  Empty.  Foley catheter in place.  IMPRESSION: No acute abnormalities.  Slight thinning of the renal cortices.   Original Report Authenticated By: Francene Boyers, M.D.     Scheduled Meds:    . albuterol  2.5 mg Nebulization Once  . antiseptic oral rinse  15 mL Mouth Rinse BID  . insulin aspart  0-15 Units Subcutaneous TID WC  . ipratropium  0.5 mg Nebulization Q6H  . levalbuterol  0.63 mg Nebulization Q6H  . levofloxacin (LEVAQUIN) IV  750 mg Intravenous Q48H  . mometasone-formoterol  2 puff Inhalation BID  . pantoprazole  40 mg Oral Daily  . sodium chloride  3 mL Intravenous Q12H  . Tamsulosin HCl  0.4 mg Oral QPC supper  . Warfarin - Pharmacist Dosing Inpatient   Does not apply q1800   Continuous Infusions:    . heparin 1,000 Units/hr (07/19/12 0453)    Principal Problem:  *Acute respiratory failure Active Problems:  DVT (deep venous thrombosis)  Leukocytosis  Acute on chronic renal failure  Hypoxia  COPD (chronic obstructive pulmonary disease)  Diabetes  mellitus  HTN (hypertension)  Anemia  Hyperlipidemia  CKD (chronic kidney disease) stage 3, GFR 30-59 ml/min  BPH (benign prostatic hypertrophy) with urinary obstruction  Calculus of kidney  CAP (community acquired pneumonia)    Time spent: > 35 mins    [facility]  [facility] Pager [phone]. If 8PM-8AM, please contact night-coverage at [email or web address], password [facility] 07/19/2012, 8:03 AM  LOS: 2 days

## 2012-07-20 DIAGNOSIS — I82409 Acute embolism and thrombosis of unspecified deep veins of unspecified lower extremity: Secondary | ICD-10-CM | POA: Diagnosis not present

## 2012-07-20 DIAGNOSIS — J9600 Acute respiratory failure, unspecified whether with hypoxia or hypercapnia: Secondary | ICD-10-CM | POA: Diagnosis not present

## 2012-07-20 DIAGNOSIS — I2699 Other pulmonary embolism without acute cor pulmonale: Secondary | ICD-10-CM | POA: Diagnosis present

## 2012-07-20 DIAGNOSIS — J189 Pneumonia, unspecified organism: Secondary | ICD-10-CM | POA: Diagnosis not present

## 2012-07-20 DIAGNOSIS — R0902 Hypoxemia: Secondary | ICD-10-CM | POA: Diagnosis not present

## 2012-07-20 DIAGNOSIS — R339 Retention of urine, unspecified: Secondary | ICD-10-CM | POA: Insufficient documentation

## 2012-07-20 LAB — GLUCOSE, CAPILLARY
Glucose-Capillary: 270 mg/dL — ABNORMAL HIGH (ref 70–99)
Glucose-Capillary: 180 mg/dL — ABNORMAL HIGH (ref 70–99)

## 2012-07-20 LAB — BASIC METABOLIC PANEL
BUN: 46 mg/dL — ABNORMAL HIGH (ref 6–23)
Chloride: 101 mEq/L (ref 96–112)
Glucose, Bld: 143 mg/dL — ABNORMAL HIGH (ref 70–99)
Potassium: 4.4 mEq/L (ref 3.5–5.1)

## 2012-07-20 LAB — CBC
HCT: 45.7 % (ref 39.0–52.0)
Hemoglobin: 14.9 g/dL (ref 13.0–17.0)
MCH: 31 pg (ref 26.0–34.0)
MCHC: 32.6 g/dL (ref 30.0–36.0)
MCV: 95.2 fL (ref 78.0–100.0)

## 2012-07-20 MED ORDER — METHYLPREDNISOLONE SODIUM SUCC 125 MG IJ SOLR
80.0000 mg | Freq: Two times a day (BID) | INTRAMUSCULAR | Status: DC
  Administered 2012-07-20: 80 mg via INTRAVENOUS
  Filled 2012-07-20 (×3): qty 1.28

## 2012-07-20 MED ORDER — WARFARIN SODIUM 1 MG PO TABS
1.0000 mg | ORAL_TABLET | Freq: Once | ORAL | Status: AC
  Administered 2012-07-20: 1 mg via ORAL
  Filled 2012-07-20 (×2): qty 1

## 2012-07-20 MED ORDER — INSULIN ASPART 100 UNIT/ML ~~LOC~~ SOLN
0.0000 [IU] | Freq: Three times a day (TID) | SUBCUTANEOUS | Status: DC
  Administered 2012-07-20: 3 [IU] via SUBCUTANEOUS
  Administered 2012-07-20: 4 [IU] via SUBCUTANEOUS
  Administered 2012-07-20: 11 [IU] via SUBCUTANEOUS
  Administered 2012-07-21: 7 [IU] via SUBCUTANEOUS
  Administered 2012-07-21: 11 [IU] via SUBCUTANEOUS
  Administered 2012-07-21 – 2012-07-22 (×2): 7 [IU] via SUBCUTANEOUS
  Administered 2012-07-22: 11 [IU] via SUBCUTANEOUS
  Administered 2012-07-22: 7 [IU] via SUBCUTANEOUS
  Administered 2012-07-23: 4 [IU] via SUBCUTANEOUS
  Administered 2012-07-23: 25 [IU] via SUBCUTANEOUS
  Administered 2012-07-23: 4 [IU] via SUBCUTANEOUS
  Administered 2012-07-24: 15 [IU] via SUBCUTANEOUS
  Administered 2012-07-24 – 2012-07-25 (×3): 4 [IU] via SUBCUTANEOUS
  Administered 2012-07-25 (×2): 7 [IU] via SUBCUTANEOUS
  Administered 2012-07-26: 4 [IU] via SUBCUTANEOUS
  Administered 2012-07-26 (×2): 11 [IU] via SUBCUTANEOUS
  Administered 2012-07-27: 3 [IU] via SUBCUTANEOUS
  Administered 2012-07-27: 7 [IU] via SUBCUTANEOUS
  Administered 2012-07-27: 11 [IU] via SUBCUTANEOUS
  Administered 2012-07-28: 7 [IU] via SUBCUTANEOUS
  Administered 2012-07-28: 100 [IU] via SUBCUTANEOUS
  Administered 2012-07-29: 3 [IU] via SUBCUTANEOUS
  Administered 2012-07-29: 7 [IU] via SUBCUTANEOUS
  Administered 2012-07-29: 4 [IU] via SUBCUTANEOUS
  Administered 2012-07-30: 3 [IU] via SUBCUTANEOUS
  Administered 2012-07-30: 4 [IU] via SUBCUTANEOUS
  Administered 2012-07-30: 7 [IU] via SUBCUTANEOUS
  Administered 2012-07-31: 4 [IU] via SUBCUTANEOUS
  Administered 2012-07-31: 15 [IU] via SUBCUTANEOUS
  Administered 2012-08-01: 3 [IU] via SUBCUTANEOUS
  Administered 2012-08-01: 7 [IU] via SUBCUTANEOUS
  Administered 2012-08-01: 11 [IU] via SUBCUTANEOUS
  Administered 2012-08-02: 3 [IU] via SUBCUTANEOUS
  Administered 2012-08-02: 15 [IU] via SUBCUTANEOUS
  Administered 2012-08-02: 4 [IU] via SUBCUTANEOUS
  Administered 2012-08-03: 7 [IU] via SUBCUTANEOUS
  Administered 2012-08-03: 11 [IU] via SUBCUTANEOUS
  Administered 2012-08-03 – 2012-08-04 (×3): 4 [IU] via SUBCUTANEOUS

## 2012-07-20 MED ORDER — INSULIN GLARGINE 100 UNIT/ML ~~LOC~~ SOLN
5.0000 [IU] | Freq: Every day | SUBCUTANEOUS | Status: DC
  Administered 2012-07-20 – 2012-07-21 (×2): 5 [IU] via SUBCUTANEOUS

## 2012-07-20 MED ORDER — METHYLPREDNISOLONE SODIUM SUCC 125 MG IJ SOLR
80.0000 mg | Freq: Three times a day (TID) | INTRAMUSCULAR | Status: DC
  Administered 2012-07-20 – 2012-07-21 (×4): 80 mg via INTRAVENOUS
  Filled 2012-07-20 (×6): qty 1.28

## 2012-07-20 NOTE — Progress Notes (Signed)
[facility] PROGRESS NOTE  Alexander SMOLENSKI ZOX:[id number] DOB: 08-20-1934 DOA: 07/17/2012 PCP: Ailene Ravel, MD  Assessment/Plan:  #1 acute respiratory failure Likely multifactorial in nature secondary to probable acute PE, probable community-acquired pneumonia per patchy infiltrate seen on chest x-ray with symptoms of a productive cough that have been ongoing for several weeks, in the setting of COPD. Clinical improvement per patient. Patient currently on a Venturi mask at 40%. Sputum Gram stain and culture pending. Urine Legionella antigen pending. Urine pneumococcus antigen negative. Continue empiric IV Levaquin and IV heparin and coumadin. Continue oxygen, continue nebs. Will add steriod taper. Unable to get CT angiogram secondary to chronic kidney disease. Unable to get VQ scan as patient can't lay flat. Follow.  #2 hypoxia Likely multifactorial secondary to probable PE in a patient with left lower extremity DVT per Doppler ultrasound in the setting of probable community-acquired pneumonia per chest x-ray findings and symptoms. Patient was tachycardic on presentation which is improved. Sputum Gram stain and cultures pending. Urine Legionella antigen pending. Urine pneumococcus antigen. Continue IV heparin. Continue coumadin. Continue IV Levaquin. Continue O2. Continue nebs. Follow.  #3 left lower extremity DVT Patient was started on IV heparin in the ED and a such a hypercoagulable panel was unable to be obtained. Continue IV heparin. Coumadin was started yesterday.   #4 probable PE Patient had presented with severe hypoxia with sats in 80% requiring a face mask. Patient was tachycardic. Lower extremity Dopplers were positive for left lower extremity DVT. Unable to get CT angiogram secondary to renal function. Heparin was started in the emergency room and a such unable to obtain a hypercoagulable panel. Unable to get VQ scan as patient could not lay flat. 2-D echo with EF of 65-70%. Right  ventricle was mildly to moderately dilated. Intraventricular septum was D-shaped with evidence for right ventricular pressure/volume overload which could be consistent with a large PE. Patient did have a slight drop in his blood pressure overnight with systolic in the 70s. Current blood pressure systolic is 125/77. Monitor blood pressure closely for hypotension and if patient develops hypotension will need IV fluids and critical care consult. Continue empiric IV heparin overlap with Coumadin.  #5 COPD exac On presentation patient did have some wheezing. Wheezing improved but patient wheezing this morning with some poor to fair air movement. Admission ABG did show a pH of 7.32 however PCO2 was 43 and PO2 was 259. Wean oxygen. Continue empiric  Levaquin, nebs. Solu-Medrol 80 mg IV q12. Follow and titrate  #6 probable community-acquired pneumonia Patient had presented with several weeks of productive cough. Chest x-ray with patchy infiltrates. Patient also did have a leukocytosis on admission. Sputum Gram stain and cultures pending. Urine Legionella antigen negative. Urine pneumococcus antigen negative. Leukocytosis is fluctuating in part also to steriods. Continue empiric Levaquin, oxygen, nebs, Robitussin as needed.  #7 acute on chronic kidney disease stage III Her PCPs records patient's last creatinine was 1.79 on 09/28/2011. On admission creatinine was 1.99.  Renal ultrasound with slight thinning of the renal cortices. No acute abnormalities.  ACE inhibitor and diuretics are on hold. Renal function has improved and close to baseline today at 1.75. Follow.  #8  Hx of anemia H&H is stable.  #9 leukocytosis Likely reactive secondary to probable PE and probable community-acquired pneumonia. Sputum Gram stain and culture pending. Urinalysis is unremarkable. Chest x-ray with patchy infiltrates. Urine Legionella antigen negative. Urine pneumococcus antigen negative. Leukocytosis is fluctuating in part also  due to steriods.  Continue  empiric Levaquin.  #10 diabetes mellitus Hemoglobin A1c is 8.4. CBGs have ranged from 137-306. Continue sliding scale insulin.  #11 hypertension Stable. Continue to hold patient's ACE inhibitor and diuretics secondary to problem #7. Follow.  #12 prophylaxis PPI for GI prophylaxis. On full dose heparin for DVT.   Code Status: Full Family Communication: Updated patient. No family at bedtime. Disposition Plan: Home when medically stable   Consultants:  None  Procedures:  Chest x-ray 07/17/2012  Lower extremity Dopplers 07/17/2012  2-D echo 07/18/2012  Renal ultrasound 07/18/2012  Antibiotics:  IV Levaquin 07/17/2012--->07/19/12  Oral levaquin 07/19/12  HPI/Subjective: Patient sleeping but easily arousable. Breathing is improved as patient is now on 2 L nasal cannula with sats above 92%.  Objective: Filed Vitals:   07/20/12 0205 07/20/12 0400 07/20/12 0410 07/20/12 0535  BP:   132/73 122/67  Pulse:   84 88  Temp:  97.4 F (36.3 C)    TempSrc:  Oral    Resp:   21 27  Height:      Weight:   96.8 kg (213 lb 6.5 oz)   SpO2: 94%  95% 91%    Intake/Output Summary (Last 24 hours) at 07/20/12 0753 Last data filed at 07/20/12 0700  Gross per 24 hour  Intake   1029 ml  Output    895 ml  Net    134 ml   Filed Weights   07/18/12 0300 07/19/12 0400 07/20/12 0410  Weight: 94.6 kg (208 lb 8.9 oz) 95.6 kg (210 lb 12.2 oz) 96.8 kg (213 lb 6.5 oz)    Exam:   General:  NAD. Sleeping upright in bed  Cardiovascular: RRR no m/r/g. Trace to 1 + B R > L LE edem  Respiratory: Exp wheezing. Tight  Abdomen: Soft/NT/ND/+BS  Data Reviewed: Basic Metabolic Panel:  Lab 07/20/12 1610 07/19/12 0340 07/18/12 0142 07/17/12 1933 07/17/12 1315  NA 135 136 137 -- 137  K 4.4 4.2 4.5 -- 4.9  CL 101 102 102 -- 99  CO2 24 25 26  -- 23  GLUCOSE 143* 144* 147* -- 214*  BUN 46* 44* 42* -- 40*  CREATININE 1.75* 1.83* 1.81* -- 1.99*  CALCIUM 8.8 8.9 8.9 --  9.4  MG -- -- -- 1.8 --  PHOS -- -- -- -- --   Liver Function Tests:  Lab 07/17/12 1315  AST 18  ALT 21  ALKPHOS 116  BILITOT 1.1  PROT 6.4  ALBUMIN 2.7*   No results found for this basename: LIPASE:5,AMYLASE:5 in the last 168 hours No results found for this basename: AMMONIA:5 in the last 168 hours CBC:  Lab 07/20/12 0346 07/19/12 0340 07/18/12 0142 07/17/12 1315  WBC 15.4* 12.5* 13.9* 19.4*  NEUTROABS -- -- -- 17.2*  HGB 14.9 14.9 15.7 16.9  HCT 45.7 46.1 47.6 50.0  MCV 95.2 95.4 95.6 95.1  PLT 177 165 152 155   Cardiac Enzymes:  Lab 07/18/12 0725 07/18/12 0142 07/17/12 1933 07/17/12 1315  CKTOTAL -- -- -- --  CKMB -- -- -- --  CKMBINDEX -- -- -- --  TROPONINI <0.30 <0.30 <0.30 <0.30   BNP (last 3 results)  Basename 07/17/12 1315  PROBNP 1312.0*   CBG:  Lab 07/19/12 2222 07/19/12 1650 07/19/12 0813 07/18/12 2143 07/18/12 1744  GLUCAP 202* 306* 137* 205* 119*    Recent Results (from the past 240 hour(s))  MRSA PCR SCREENING     Status: Normal   Collection Time   07/17/12  6:46 PM  Component Value Range Status Comment   MRSA by PCR NEGATIVE  NEGATIVE Final   URINE CULTURE     Status: Normal (Preliminary result)   Collection Time   07/17/12 11:07 PM      Component Value Range Status Comment   Specimen Description URINE, CLEAN CATCH   Final    Special Requests NONE   Final    Culture  Setup Time 07/18/2012 09:18   Final    Colony Count PENDING   Incomplete    Culture Culture reincubated for better growth   Final    Report Status PENDING   Incomplete      Studies: US Renal  07/18/2012  *RADIOLOGY REPORT*  Clinical Data: Acute renal failure.  RENAL/URINARY TRACT ULTRASOUND COMPLETE  Comparison:  None.  Findings:  Right Kidney:  10.2 cm in length.  Diffuse thinning of the renal cortex.  No hydronephrosis.  Left Kidney:  10.4 cm in length.  Diffuse slight thinning of the renal cortex.  1.3 cm simple appearing cyst in the inferior medial aspect of the  kidney.  No hydronephrosis.  Bladder:  Empty.  Foley catheter in place.  IMPRESSION: No acute abnormalities.  Slight thinning of the renal cortices.   Original Report Authenticated By: Francene Boyers, M.D.     Scheduled Meds:    . albuterol  2.5 mg Nebulization Once  . antiseptic oral rinse  15 mL Mouth Rinse BID  . guaiFENesin  1,200 mg Oral BID  . insulin aspart  0-20 Units Subcutaneous TID WC  . insulin glargine  5 Units Subcutaneous QHS  . ipratropium  0.5 mg Nebulization Q6H  . levalbuterol  0.63 mg Nebulization Q6H  . levofloxacin  750 mg Oral Q48H  . methylPREDNISolone (SOLU-MEDROL) injection  80 mg Intravenous Q12H  . mometasone-formoterol  2 puff Inhalation BID  . pantoprazole  40 mg Oral Daily  . sodium chloride  3 mL Intravenous Q12H  . Tamsulosin HCl  0.4 mg Oral QPC supper  . warfarin  1 mg Oral ONCE-1800  . Warfarin - Pharmacist Dosing Inpatient   Does not apply q1800   Continuous Infusions:    . heparin 700 Units/hr (07/19/12 2000)    Principal Problem:  *Acute respiratory failure Active Problems:  DVT (deep venous thrombosis)  Leukocytosis  Acute on chronic renal failure  Hypoxia  COPD with exacerbation  Diabetes mellitus  HTN (hypertension)  Anemia  Hyperlipidemia  CKD (chronic kidney disease) stage 3, GFR 30-59 ml/min  BPH (benign prostatic hypertrophy) with urinary obstruction  Calculus of kidney  CAP (community acquired pneumonia)  Urinary retention    Time spent: > 35 mins    [facility]  [facility] Pager [phone]. If 8PM-8AM, please contact night-coverage at [email or web address], password [facility] 07/20/2012, 7:53 AM  LOS: 3 days

## 2012-07-20 NOTE — Progress Notes (Signed)
ANTICOAGULATION CONSULT NOTE - Follow Up  Pharmacy Consult for Warfarin and Heparin Indication: DVT/Possible PE  No Known Allergies  Patient Measurements: Height: 5\' 9"  (175.3 cm) Weight: 213 lb 6.5 oz (96.8 kg) IBW/kg (Calculated) : 70.7  Heparin Dosing Weight: 90kg  Vital Signs: Temp: 97.4 F (36.3 C) (02/02 0400) Temp src: Oral (02/02 0400) BP: 122/67 mmHg (02/02 0535) Pulse Rate: 88  (02/02 0535)  Labs:  Basename 07/20/12 0346 07/19/12 2005 07/19/12 1245 07/19/12 0340 07/18/12 0725 07/18/12 0142 07/17/12 1933 07/17/12 1317  HGB 14.9 -- -- 14.9 -- -- -- --  HCT 45.7 -- -- 46.1 -- 47.6 -- --  PLT 177 -- -- 165 -- 152 -- --  APTT -- -- -- -- -- 148* -- 31  LABPROT 20.3* -- -- 14.9 -- 15.7* -- --  INR 1.81* -- -- 1.19 -- 1.28 -- --  HEPARINUNFRC 0.41 0.36 0.79* -- -- -- -- --  CREATININE 1.75* -- -- 1.83* -- 1.81* -- --  CKTOTAL -- -- -- -- -- -- -- --  CKMB -- -- -- -- -- -- -- --  TROPONINI -- -- -- -- <0.30 <0.30 <0.30 --    Estimated Creatinine Clearance: 41.8 ml/min (by C-G formula based on Cr of 1.75).  Assessment: 78 YO M presents 1/30 with SOB, LE dopplers reveals LLE DVT.  Unable to obtain CT scan or VQ scan 1/31. D-dimer elevated on 1/30. No recent procedures, had BL cataract surgery in Oct. Wife states he bleeds easily but per medication list not on any antiplatelets or blood thinners. IV heparin started 1/30, warfarin started 1/31. Today is Day #3 of 5 day minimum overlap with warfarin/heparin. Using lower warfarin dose due to presence of potential drug interaction with Levaquin (possible increased INR response)  Heparin level this am remains therapeutic (0.41). No bleeding events documented in chart notes. Will change to daily heparin levels now.   INR remains subtherapeutic as expected after only 2 doses of warfarin 4mg ; however, there was a large, unexpected increase in INR overnight (1.19 --> 1.81). Used lower dosing initially due to drug interaction with  Levaquin (possible increase in INR). Will decrease tonight warfarin dose even further to slow INR progression and hopefully avoid INR>3 in the next few days.  H/H, plts wnl  Goal of Therapy:  Heparin level 0.3-0.7 units/ml Monitor platelets by anticoagulation protocol: Yes INR 2-3   Plan:   Continue heparin at 700 units/hr (7 ml/hr)  Warfarin 1mg  PO x1 at 18:00  Daily HL, PT/INR  Pt will needs a minimum of 5 Days overlap with warfarin and heparin. Heparin to continue until 5 Day over lap complete AND INR > 2 x2 consecutive days.  Darrol Angel, PharmD Pager: [phone] 07/20/2012,7:13 AM

## 2012-07-21 DIAGNOSIS — R0902 Hypoxemia: Secondary | ICD-10-CM | POA: Diagnosis not present

## 2012-07-21 DIAGNOSIS — I82409 Acute embolism and thrombosis of unspecified deep veins of unspecified lower extremity: Secondary | ICD-10-CM | POA: Diagnosis not present

## 2012-07-21 DIAGNOSIS — J449 Chronic obstructive pulmonary disease, unspecified: Secondary | ICD-10-CM | POA: Diagnosis not present

## 2012-07-21 DIAGNOSIS — I2699 Other pulmonary embolism without acute cor pulmonale: Secondary | ICD-10-CM | POA: Diagnosis not present

## 2012-07-21 DIAGNOSIS — J189 Pneumonia, unspecified organism: Secondary | ICD-10-CM | POA: Diagnosis not present

## 2012-07-21 DIAGNOSIS — J9600 Acute respiratory failure, unspecified whether with hypoxia or hypercapnia: Secondary | ICD-10-CM | POA: Diagnosis not present

## 2012-07-21 DIAGNOSIS — J441 Chronic obstructive pulmonary disease with (acute) exacerbation: Secondary | ICD-10-CM | POA: Diagnosis not present

## 2012-07-21 LAB — BASIC METABOLIC PANEL
CO2: 23 mEq/L (ref 19–32)
Calcium: 8.7 mg/dL (ref 8.4–10.5)
GFR calc Af Amer: 44 mL/min — ABNORMAL LOW (ref >90)
Sodium: 132 mEq/L — ABNORMAL LOW (ref 135–145)

## 2012-07-21 LAB — BLOOD GAS, ARTERIAL
Bicarbonate: 19.5 mEq/L — ABNORMAL LOW (ref 20.0–24.0)
Drawn by: 307971
O2 Content: 4 L/min
O2 Saturation: 93.7 %
Patient temperature: 98.6
pH, Arterial: 7.33 — ABNORMAL LOW (ref 7.350–7.450)
pO2, Arterial: 69.5 mmHg — ABNORMAL LOW (ref 80.0–100.0)

## 2012-07-21 LAB — GLUCOSE, CAPILLARY: Glucose-Capillary: 202 mg/dL — ABNORMAL HIGH (ref 70–99)

## 2012-07-21 LAB — URINE CULTURE

## 2012-07-21 LAB — CBC
HCT: 44 % (ref 39.0–52.0)
MCV: 94.4 fL (ref 78.0–100.0)
Platelets: 163 10*3/uL (ref 150–400)
RBC: 4.66 MIL/uL (ref 4.22–5.81)
WBC: 13.1 10*3/uL — ABNORMAL HIGH (ref 4.0–10.5)

## 2012-07-21 LAB — PROTIME-INR: INR: 2.18 — ABNORMAL HIGH (ref 0.00–1.49)

## 2012-07-21 LAB — HEPARIN LEVEL (UNFRACTIONATED)
Heparin Unfractionated: 0.17 IU/mL — ABNORMAL LOW (ref 0.30–0.70)
Heparin Unfractionated: 0.31 IU/mL (ref 0.30–0.70)

## 2012-07-21 MED ORDER — LORAZEPAM 2 MG/ML IJ SOLN
0.5000 mg | Freq: Three times a day (TID) | INTRAMUSCULAR | Status: DC | PRN
  Filled 2012-07-21: qty 1

## 2012-07-21 MED ORDER — WARFARIN SODIUM 1 MG PO TABS
1.0000 mg | ORAL_TABLET | Freq: Once | ORAL | Status: AC
  Administered 2012-07-21: 1 mg via ORAL
  Filled 2012-07-21: qty 1

## 2012-07-21 MED ORDER — HEPARIN (PORCINE) IN NACL 100-0.45 UNIT/ML-% IJ SOLN
900.0000 [IU]/h | INTRAMUSCULAR | Status: DC
  Filled 2012-07-21 (×2): qty 250

## 2012-07-21 MED ORDER — BUDESONIDE 0.25 MG/2ML IN SUSP
0.2500 mg | Freq: Four times a day (QID) | RESPIRATORY_TRACT | Status: DC
  Administered 2012-07-21 – 2012-08-04 (×50): 0.25 mg via RESPIRATORY_TRACT
  Filled 2012-07-21 (×62): qty 2

## 2012-07-21 MED ORDER — ACETYLCYSTEINE 20 % IN SOLN
2.0000 mL | Freq: Four times a day (QID) | RESPIRATORY_TRACT | Status: AC
  Administered 2012-07-21 – 2012-07-23 (×8): 2 mL via RESPIRATORY_TRACT
  Filled 2012-07-21 (×8): qty 4

## 2012-07-21 MED ORDER — FLUTICASONE PROPIONATE 50 MCG/ACT NA SUSP
1.0000 | Freq: Every day | NASAL | Status: DC
  Administered 2012-07-21 – 2012-08-04 (×15): 1 via NASAL
  Filled 2012-07-21 (×3): qty 16

## 2012-07-21 MED ORDER — ALBUTEROL SULFATE (5 MG/ML) 0.5% IN NEBU
2.5000 mg | INHALATION_SOLUTION | Freq: Four times a day (QID) | RESPIRATORY_TRACT | Status: DC
  Administered 2012-07-21 – 2012-08-04 (×53): 2.5 mg via RESPIRATORY_TRACT
  Filled 2012-07-21 (×55): qty 0.5

## 2012-07-21 MED ORDER — METHYLPREDNISOLONE SODIUM SUCC 40 MG IJ SOLR
40.0000 mg | Freq: Two times a day (BID) | INTRAMUSCULAR | Status: DC
  Administered 2012-07-22: 40 mg via INTRAVENOUS
  Filled 2012-07-21 (×3): qty 1

## 2012-07-21 MED ORDER — HEPARIN (PORCINE) IN NACL 100-0.45 UNIT/ML-% IJ SOLN
850.0000 [IU]/h | INTRAMUSCULAR | Status: DC
  Administered 2012-07-21: 850 [IU]/h via INTRAVENOUS
  Filled 2012-07-21: qty 250

## 2012-07-21 MED ORDER — LEVALBUTEROL HCL 0.63 MG/3ML IN NEBU
0.6300 mg | INHALATION_SOLUTION | Freq: Once | RESPIRATORY_TRACT | Status: AC
  Administered 2012-07-21: 0.63 mg via RESPIRATORY_TRACT
  Filled 2012-07-21: qty 3

## 2012-07-21 MED ORDER — ALBUTEROL SULFATE (5 MG/ML) 0.5% IN NEBU
2.5000 mg | INHALATION_SOLUTION | RESPIRATORY_TRACT | Status: DC | PRN
  Administered 2012-07-31: 2.5 mg via RESPIRATORY_TRACT

## 2012-07-21 MED ORDER — ALPRAZOLAM 0.25 MG PO TABS
0.2500 mg | ORAL_TABLET | Freq: Three times a day (TID) | ORAL | Status: DC | PRN
  Administered 2012-07-21 – 2012-08-02 (×12): 0.25 mg via ORAL
  Filled 2012-07-21 (×13): qty 1

## 2012-07-21 NOTE — Progress Notes (Signed)
Pharmacy: Heparin  Heparin level this evening (0.31) at the low end of therapeutic (0.3-0.7) on Heparin 850 units/hr.    Plan 1.) Increase Heparin to 900 units/hr to maintain heparin level within therapeutic range 2.) F/u AM Heparin level   Chalonda Schlatter, Loma Messing PharmD Pager #: [phone] 9:12 PM 07/21/2012

## 2012-07-21 NOTE — Progress Notes (Addendum)
ANTICOAGULATION CONSULT NOTE - Follow Up Consult  Pharmacy Consult for Heparin, Warfarin Indication: DVT, PE  No Known Allergies  Patient Measurements: Height: 5\' 9"  (175.3 cm) Weight: 213 lb 6.5 oz (96.8 kg) IBW/kg (Calculated) : 70.7  Heparin Dosing Weight: 90 kg  Vital Signs: Temp: 98 F (36.7 C) (02/03 0600) Temp src: Oral (02/03 0600) BP: 147/72 mmHg (02/03 0938) Pulse Rate: 110  (02/03 0938)  Labs:  Basename 07/21/12 0340 07/20/12 0346 07/19/12 2005 07/19/12 0340  HGB 14.8 14.9 -- --  HCT 44.0 45.7 -- 46.1  PLT 163 177 -- 165  APTT -- -- -- --  LABPROT 23.3* 20.3* -- 14.9  INR 2.18* 1.81* -- 1.19  HEPARINUNFRC 0.17* 0.41 0.36 --  CREATININE 1.68* 1.75* -- 1.83*  CKTOTAL -- -- -- --  CKMB -- -- -- --  TROPONINI -- -- -- --    Estimated Creatinine Clearance: 43.6 ml/min (by C-G formula based on Cr of 1.68).  Assessment:  77 yom presents 1/30 with SOB, LE dopplers reveals LLE DVT. Unable to obtain CT scan or VQ scan 1/31. D-dimer elevated on 1/30. No recent procedures, had BL cataract surgery in Oct. Wife states he bleeds easily but per medication list not on any antiplatelets or blood thinners. IV heparin started 1/30, warfarin started 1/31.   Today is Day #4 of 5 day minimum overlap with warfarin/heparin. Using lower warfarin dose due to presence of potential drug interaction with Levaquin (possible increased INR response)  Heparin level (0.42) is therpaeutic  INR (2.18) increased to therapeutic today.  Continue with low dose of warfarin as previously ordered due to previous quick rise in INR.  CBC remains stable/wnl   Goal of Therapy:  INR 2-3 Heparin level 0.3-0.7 units/ml Monitor platelets by anticoagulation protocol: Yes   Plan:   Continue Heparin IV infusion at 850 units/hr  Recheck heparin level in 8 hours to confirm therapeutic level.  Wafarin 1mg  PO x1 today  Daily heparin level, INR, CBC  Patient will need a minimum of 5 days warfarin /  Heparin overlap and until INR > 2 for 24 hours.   Warfarin education prior to discharge   Lynann Beaver PharmD, BCPS Pager [phone] 07/21/2012 12:48 PM

## 2012-07-21 NOTE — Progress Notes (Signed)
Physical Therapy Treatment Patient Details Name: Alexander Kirby MRN: [id number] DOB: January 01, 1935 Today's Date: 07/21/2012 Time: 5621-3086 PT Time Calculation (min): 26 min  PT Assessment / Plan / Recommendation Comments on Treatment Session  Pt improving in mobility status, though still is limted by dyspnea and O2 sat fluctuation. He is able to stand for a long period of time and should be ready to progress to gait with RW and O2 support next session    Follow Up Recommendations  Home health PT     Does the patient have the potential to tolerate intense rehabilitation     Barriers to Discharge        Equipment Recommendations  None recommended by PT    Recommendations for Other Services OT consult  Frequency Min 3X/week   Plan Discharge plan remains appropriate;Frequency remains appropriate    Precautions / Restrictions     Pertinent Vitals/Pain No c/o pain    Mobility  Bed Mobility Details for Bed Mobility Assistance: pt sitting up in chair per nursing Transfers Transfers: Sit to Stand;Stand to Sit Sit to Stand: 5: Supervision Stand to Sit: 5: Supervision Details for Transfer Assistance: pt using arms to push up Ambulation/Gait Ambulation/Gait Assistance: 5: Supervision Ambulation Distance (Feet): 25 Feet (forward and back several times in room) Assistive device: Rolling walker Ambulation/Gait Assistance Details: supervision for safety for lines and tubes Gait Pattern: Step-to pattern;Decreased step length - right;Decreased step length - left Gait velocity: decreased General Gait Details: pt limited by respirations. He is able to stand erect, walk forward and back within the limits of O2 tubing and seeks tripod position with forearms on walker to rest Stairs: No Wheelchair Mobility Wheelchair Mobility: No    Exercises     PT Diagnosis:    PT Problem List:   PT Treatment Interventions:     PT Goals Acute Rehab PT Goals PT Goal Formulation: With patient Time For  Goal Achievement: 08/02/12 Potential to Achieve Goals: Fair Pt will go Supine/Side to Sit: with modified independence Pt will go Sit to Supine/Side: with modified independence Pt will go Sit to Stand: with modified independence PT Goal: Sit to Stand - Progress: Progressing toward goal Pt will go Stand to Sit: with modified independence PT Goal: Stand to Sit - Progress: Progressing toward goal Pt will Ambulate: 1 - 15 feet;with least restrictive assistive device;with supervision (sats >90%) PT Goal: Ambulate - Progress: Progressing toward goal  Visit Information  Last PT Received On: 07/21/12 Assistance Needed: +2    Subjective Data  Subjective: "Now, Let me do it" Patient Stated Goal: home   Cognition  Cognition Overall Cognitive Status: Appears within functional limits for tasks assessed/performed Arousal/Alertness: Awake/alert Orientation Level: Appears intact for tasks assessed Behavior During Session: [facility] for tasks performed Cognition - Other Comments: pt appears a little anxious at first, but was able to perform mobility as requested     Balance  Balance Balance Assessed: Yes Static Sitting Balance Static Sitting - Balance Support: No upper extremity supported Static Sitting - Level of Assistance: 7: Independent Static Standing Balance Static Standing - Balance Support: Bilateral upper extremity supported;Right upper extremity supported;Left upper extremity supported Static Standing - Level of Assistance: 6: Modified independent (Device/Increase time) Static Standing - Comment/# of Minutes: pt stood for > 15 minutes with deep breathing, glute sets, weight shifting, stepping forward and back. O2 sats varied from 80-95% on 4L. HR ave 110, respirations ave 28  End of Session PT - End of Session Equipment Utilized  During Treatment: Oxygen Activity Tolerance: Treatment limited secondary to medical complications (Comment);Other (comment) (dyspnea) Nurse Communication: Mobility  status   GP    Bayard Hugger. [place], [place] [phone]  07/21/2012, 2:39 PM

## 2012-07-21 NOTE — Progress Notes (Signed)
[id number]/Rhonda Davis, RN, BSN, CCM:  CHART REVIEWED AND UPDATED.  Next chart review due on [id number]. NO DISCHARGE NEEDS PRESENT AT THIS TIME. CASE MANAGEMENT [phone]  

## 2012-07-21 NOTE — Progress Notes (Signed)
[facility] PROGRESS NOTE  Alexander Kirby JXB:[id number] DOB: 01-14-1935 DOA: 07/17/2012 PCP: Ailene Ravel, MD  Assessment/Plan:  #1 acute respiratory failure Likely multifactorial in nature secondary to probable acute PE, probable community-acquired pneumonia per patchy infiltrate seen on chest x-ray with symptoms of a productive cough that have been ongoing for several weeks, in the setting of COPD. patient sitting upright in bed and tripod stance. Patient using accessory muscles of respiration. Patient currently 4 L nasal cannula with sats of 94%. Sputum Gram stain and culture pending. Urine Legionella antigen negative. Urine pneumococcus antigen negative. Continue empiric IV Levaquin and IV heparin and coumadin. Continue oxygen, continue nebs. Continue IV steroid taper. Will add chest PT. Unable to get CT angiogram secondary to chronic kidney disease. Unable to get VQ scan as patient can't lay flat. Follow. We'll also place on Ativan as needed. Repeat ABG with a pH of 7.33, PCO2 of 38, PO2 of 69.5. Will consult critical care medicine for further evaluation and management.  #2 hypoxia Likely multifactorial secondary to probable PE in a patient with left lower extremity DVT per Doppler ultrasound in the setting of probable community-acquired pneumonia per chest x-ray findings and symptoms. Patient was tachycardic on presentation which is improved. Sputum Gram stain and cultures pending. Urine Legionella antigen negative. Urine pneumococcus antigen negative. Continue IV heparin. Continue coumadin. Continue IV Levaquin. Continue O2. Continue nebs. Follow.  #3 left lower extremity DVT Patient was started on IV heparin in the ED and a such a hypercoagulable panel was unable to be obtained. Continue IV heparin overlap with Coumadin   #4 probable PE Patient had presented with severe hypoxia with sats in 80% requiring a face mask. Patient was tachycardic. Lower extremity Dopplers were positive for  left lower extremity DVT. Unable to get CT angiogram secondary to renal function. Heparin was started in the emergency room and a such unable to obtain a hypercoagulable panel. Unable to get VQ scan as patient could not lay flat. 2-D echo with EF of 65-70%. Right ventricle was mildly to moderately dilated. Intraventricular septum was D-shaped with evidence for right ventricular pressure/volume overload which could be consistent with a large PE. Patient did have a slight drop in his blood pressure overnight with systolic in the 70s 2 days ago. Current blood pressure systolic is 124/69. Monitor blood pressure closely for hypotension and if patient develops hypotension will need IV fluids and critical care consult. Continue empiric IV heparin overlap with Coumadin.  #5 COPD exac On presentation patient did have some wheezing. Wheezing improved but patient wheezing this morning with some poor to fair air movement. Patient tripod stance using accessory muscles of respiration. Admission ABG did show a pH of 7.32 however PCO2 was 43 and PO2 was 259. Wean oxygen. Continue empiric  Levaquin, nebs. Solu-Medrol 80 mg IV q8. Repeat ABG today with a pH of 7.3, PCO2 of 38, PO2 of 70. Will given another nebulizer treatment. Chest PT. Flutter valve. Patient is on mucolytics. Follow and titrate  #6 probable community-acquired pneumonia Patient had presented with several weeks of productive cough. Chest x-ray with patchy infiltrates. Patient also did have a leukocytosis on admission. Sputum Gram stain and cultures pending. Urine Legionella antigen negative. Urine pneumococcus antigen negative. Leukocytosis is fluctuating in part also due to steriods. Continue empiric Levaquin, oxygen, nebs, Robitussin as needed.  #7 acute on chronic kidney disease stage III Her PCPs records patient's last creatinine was 1.79 on 09/28/2011. On admission creatinine was 1.99.  Renal ultrasound with  slight thinning of the renal cortices. No  acute abnormalities.  ACE inhibitor and diuretics are on hold. Renal function has improved and close to baseline today at 1.68. Follow.  #8  Hx of anemia H&H is stable.  #9 leukocytosis Likely reactive secondary to probable PE and probable community-acquired pneumonia. Sputum Gram stain and culture pending. Urinalysis is unremarkable. Urine culture with 75,000 of enterococcus species. Sensitivities pending. Chest x-ray with patchy infiltrates. Urine Legionella antigen negative. Urine pneumococcus antigen negative. Leukocytosis is fluctuating in part also due to steriods.  Continue empiric Levaquin.  #10 diabetes mellitus Hemoglobin A1c is 8.4. CBGs have ranged from 180-270. Continue sliding scale insulin.  #11 hypertension Stable. Continue to hold patient's ACE inhibitor and diuretics secondary to problem #7. Follow.  #12 prophylaxis PPI for GI prophylaxis. On full dose heparin for DVT.   Code Status: Full Family Communication: Updated patient. No family at bedtime. Disposition Plan: Home when medically stable   Consultants:  None  Procedures:  Chest x-ray 07/17/2012  Lower extremity Dopplers 07/17/2012  2-D echo 07/18/2012  Renal ultrasound 07/18/2012  Antibiotics:  IV Levaquin 07/17/2012--->07/19/12  Oral levaquin 07/19/12  HPI/Subjective: Patient sitting upright in bed and tripod. Using accessory muscles of respiration. Pursed breathing. Patient frustrated can get cough out.  Objective: Filed Vitals:   07/21/12 0151 07/21/12 0400 07/21/12 0600 07/21/12 0743  BP:  124/69    Pulse:  73 73   Temp:   98 F (36.7 C)   TempSrc:   Oral   Resp:  23 23   Height:      Weight:      SpO2: 90% 93% 92% 96%    Intake/Output Summary (Last 24 hours) at 07/21/12 0844 Last data filed at 07/21/12 0600  Gross per 24 hour  Intake    860 ml  Output   1300 ml  Net   -440 ml   Filed Weights   07/18/12 0300 07/19/12 0400 07/20/12 0410  Weight: 94.6 kg (208 lb 8.9 oz) 95.6 kg  (210 lb 12.2 oz) 96.8 kg (213 lb 6.5 oz)    Exam:   General: Patient with pursed lip breathing and tripod  Cardiovascular: Tachycardia no m/r/g. Trace to 1 + B R > L LE edem  Respiratory: Exp wheezing. Tight. Use of accessory muscles of respiration  Abdomen: Soft/NT/ND/+BS  Data Reviewed: Basic Metabolic Panel:  Lab 07/21/12 1610 07/20/12 0346 07/19/12 0340 07/18/12 0142 07/17/12 1933 07/17/12 1315  NA 132* 135 136 137 -- 137  K 4.8 4.4 4.2 4.5 -- 4.9  CL 99 101 102 102 -- 99  CO2 23 24 25 26  -- 23  GLUCOSE 178* 143* 144* 147* -- 214*  BUN 47* 46* 44* 42* -- 40*  CREATININE 1.68* 1.75* 1.83* 1.81* -- 1.99*  CALCIUM 8.7 8.8 8.9 8.9 -- 9.4  MG -- -- -- -- 1.8 --  PHOS -- -- -- -- -- --   Liver Function Tests:  Lab 07/17/12 1315  AST 18  ALT 21  ALKPHOS 116  BILITOT 1.1  PROT 6.4  ALBUMIN 2.7*   No results found for this basename: LIPASE:5,AMYLASE:5 in the last 168 hours No results found for this basename: AMMONIA:5 in the last 168 hours CBC:  Lab 07/21/12 0340 07/20/12 0346 07/19/12 0340 07/18/12 0142 07/17/12 1315  WBC 13.1* 15.4* 12.5* 13.9* 19.4*  NEUTROABS -- -- -- -- 17.2*  HGB 14.8 14.9 14.9 15.7 16.9  HCT 44.0 45.7 46.1 47.6 50.0  MCV 94.4 95.2 95.4 95.6  95.1  PLT 163 177 165 152 155   Cardiac Enzymes:  Lab 07/18/12 0725 07/18/12 0142 07/17/12 1933 07/17/12 1315  CKTOTAL -- -- -- --  CKMB -- -- -- --  CKMBINDEX -- -- -- --  TROPONINI <0.30 <0.30 <0.30 <0.30   BNP (last 3 results)  Basename 07/17/12 1315  PROBNP 1312.0*   CBG:  Lab 07/20/12 2143 07/20/12 1638 07/20/12 1212 07/20/12 0826 07/19/12 2222  GLUCAP 180* 270* 196* 124* 202*    Recent Results (from the past 240 hour(s))  MRSA PCR SCREENING     Status: Normal   Collection Time   07/17/12  6:46 PM      Component Value Range Status Comment   MRSA by PCR NEGATIVE  NEGATIVE Final   URINE CULTURE     Status: Normal (Preliminary result)   Collection Time   07/17/12 11:07 PM       Component Value Range Status Comment   Specimen Description URINE, CLEAN CATCH   Final    Special Requests NONE   Final    Culture  Setup Time 07/18/2012 09:18   Final    Colony Count 75,000 COLONIES/ML   Final    Culture ENTEROCOCCUS SPECIES   Final    Report Status PENDING   Incomplete      Studies: No results found.  Scheduled Meds:    . albuterol  2.5 mg Nebulization Once  . antiseptic oral rinse  15 mL Mouth Rinse BID  . guaiFENesin  1,200 mg Oral BID  . insulin aspart  0-20 Units Subcutaneous TID WC  . insulin glargine  5 Units Subcutaneous QHS  . ipratropium  0.5 mg Nebulization Q6H  . levalbuterol  0.63 mg Nebulization Q6H  . levofloxacin  750 mg Oral Q48H  . methylPREDNISolone (SOLU-MEDROL) injection  80 mg Intravenous Q8H  . mometasone-formoterol  2 puff Inhalation BID  . pantoprazole  40 mg Oral Daily  . sodium chloride  3 mL Intravenous Q12H  . Tamsulosin HCl  0.4 mg Oral QPC supper  . warfarin  1 mg Oral ONCE-1800  . Warfarin - Pharmacist Dosing Inpatient   Does not apply q1800   Continuous Infusions:    . heparin 850 Units/hr (07/21/12 0500)    Principal Problem:  *Acute respiratory failure Active Problems:  DVT (deep venous thrombosis)  Leukocytosis  Acute on chronic renal failure  Hypoxia  COPD with exacerbation  Diabetes mellitus  HTN (hypertension)  Anemia  Hyperlipidemia  CKD (chronic kidney disease) stage 3, GFR 30-59 ml/min  BPH (benign prostatic hypertrophy) with urinary obstruction  Calculus of kidney  CAP (community acquired pneumonia)  Urinary retention  PE (pulmonary embolism)    Time spent: > 35 mins    [facility]  [facility] Pager [phone]. If 8PM-8AM, please contact night-coverage at [email or web address], password [facility] 07/21/2012, 8:44 AM  LOS: 4 days

## 2012-07-21 NOTE — Progress Notes (Signed)
ANTICOAGULATION CONSULT NOTE - Follow Up  Pharmacy Consult for Warfarin and Heparin Indication: DVT/Possible PE  No Known Allergies  Patient Measurements: Height: 5\' 9"  (175.3 cm) Weight: 213 lb 6.5 oz (96.8 kg) IBW/kg (Calculated) : 70.7  Heparin Dosing Weight: 90kg  Vital Signs: Temp: 98.3 F (36.8 C) (02/03 0000) Temp src: Oral (02/03 0000) BP: 124/69 mmHg (02/03 0400) Pulse Rate: 73  (02/03 0400)  Labs:  Basename 07/21/12 0340 07/20/12 0346 07/19/12 2005 07/19/12 0340 07/18/12 0725  HGB 14.8 14.9 -- -- --  HCT 44.0 45.7 -- 46.1 --  PLT 163 177 -- 165 --  APTT -- -- -- -- --  LABPROT 23.3* 20.3* -- 14.9 --  INR 2.18* 1.81* -- 1.19 --  HEPARINUNFRC 0.17* 0.41 0.36 -- --  CREATININE 1.68* 1.75* -- 1.83* --  CKTOTAL -- -- -- -- --  CKMB -- -- -- -- --  TROPONINI -- -- -- -- <0.30    Estimated Creatinine Clearance: 43.6 ml/min (by C-G formula based on Cr of 1.68).  Assessment: 78 YO M presents 1/30 with SOB, LE dopplers reveals LLE DVT.  Unable to obtain CT scan or VQ scan 1/31. D-dimer elevated on 1/30. No recent procedures, had BL cataract surgery in Oct. Wife states he bleeds easily but per medication list not on any antiplatelets or blood thinners. IV heparin started 1/30, warfarin started 1/31. Today is Day #4 of 5 day minimum overlap with warfarin/heparin. Using lower warfarin dose due to presence of potential drug interaction with Levaquin (possible increased INR response)  Heparin level this am therapeutic (0.17). No bleeding events documented in chart notes. Will adjust rate and recheck HL in 6 hr.  INR continues to rise and is therapeutic today (2.18) as expected after only 2 doses of warfarin 4mg ; however, there was a large, unexpected increase in INR on 2/2 (1.19 --> 1.81). Used lower dosing initially due to drug interaction with Levaquin (possible increase in INR). Will decrease tonight warfarin dose even further to slow INR progression and hopefully avoid INR>3  in the next few days.  Will again today give a smaller dose of warfarin.  H/H, plts wnl  Goal of Therapy:  Heparin level 0.3-0.7 units/ml Monitor platelets by anticoagulation protocol: Yes INR 2-3   Plan:   Increase heparin to 850 units/hr   Warfarin 1mg  PO x1 at 18:00  Daily HL, PT/INR  Pt will needs a minimum of 5 Days overlap with warfarin and heparin. Heparin to continue until 5 Day over lap complete AND INR > 2 x2 consecutive days.  Terrilee Files, PharmD 07/21/2012,4:41 AM

## 2012-07-21 NOTE — Consult Note (Signed)
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 Became more hypoxic. Increased wob and PCCM was consulted 2-3.  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 Became more hypoxic. Increased wob and PCCM was consulted 2-3.  He has had great difficulty mobilizing secretions and is now better after successful use of flutter valve  PAST MEDICAL HISTORY :  Past Medical History  Diagnosis Date  . COPD (chronic obstructive pulmonary disease)   . Hypertension   . Diabetes mellitus without complication   . Anemia   . Diabetes mellitus 07/17/2012  . HTN (hypertension) 07/17/2012  . Hyperlipidemia 07/17/2012  . CKD (chronic kidney disease) stage 3, GFR 30-59 ml/min 07/17/2012  . BPH (benign prostatic hypertrophy) with urinary obstruction 07/17/2012  . Calculus of kidney 07/17/2012   Past Surgical History  Procedure Date  . Cataract extraction, bilateral Oct. 2013    bilateral eyes  . Other surgical history 27 months old    pt states "I was ruptured and had surgery."  . Eye surgery 2013    cataract bilateral extraction   Prior to Admission medications   Medication Sig Start Date End Date Taking? Authorizing Provider  albuterol (PROVENTIL) (2.5 MG/3ML) 0.083% nebulizer solution Take 2.5 mg by nebulization every 6 (six) hours as needed. FOR SHORTNESS OF BREATH OR WHEEZING   Yes Historical Provider, MD   budesonide-formoterol (SYMBICORT) 160-4.5 MCG/ACT inhaler Inhale 2 puffs into the lungs 2 (two) times daily.   Yes Historical Provider, MD  enalapril-hydrochlorothiazide (VASERETIC) 10-25 MG per tablet Take 1 tablet by mouth every morning.   Yes Historical Provider, MD  Fluticasone-Salmeterol (ADVAIR) 500-50 MCG/DOSE AEPB Inhale 1 puff into the lungs every 12 (twelve) hours.   Yes Historical Provider, MD  glimepiride (AMARYL) 1 MG tablet Take 1 mg by mouth daily before breakfast.   Yes Historical Provider, MD  NIFEdipine (PROCARDIA XL/ADALAT-CC) 60 MG 24 hr tablet Take 60 mg by mouth every morning.   Yes Historical Provider, MD  pirbuterol (MAXAIR) 200 MCG/INH inhaler Inhale 2 puffs into the lungs 4 (four) times daily as needed. FOR SHORTNESS OF BREATH OR WHEEZING   Yes Historical Provider, MD  Plant Sterols and Stanols (CHOLEST OFF) 450 MG TABS Take 450 mg by mouth every morning.   Yes Historical Provider, MD  Red Yeast Rice 600 MG TABS Take 1,800 mg by mouth every morning.   Yes Historical Provider, MD  Tamsulosin HCl (FLOMAX) 0.4 MG CAPS Take 0.4 mg by mouth daily after supper.   Yes Historical Provider, MD   No Known Allergies  FAMILY HISTORY:  History reviewed. No pertinent family history. SOCIAL HISTORY:  reports that he quit smoking about 12 years ago. He has never used smokeless tobacco. He reports that he does not drink alcohol or use illicit drugs.  REVIEW OF SYSTEMS: taken see hpi  SUBJECTIVE:  Very anxious - improved after low dose Xanax   VITAL SIGNS: Temp:  [  97 F (36.1 C)-98.3 F (36.8 C)] 98 F (36.7 C) (02/03 0600) Pulse Rate:  [73-110] 110  (02/03 0938) Resp:  [23-28] 28  (02/03 0938) BP: (122-148)/(69-95) 147/72 mmHg (02/03 0938) SpO2:  [90 %-97 %] 95 % (02/03 0938) HEMODYNAMICS:   VENTILATOR SETTINGS:   INTAKE / OUTPUT: Intake/Output      02/02 0701 - 02/03 0700 02/03 0701 - 02/04 0700   P.O. 480    I.V. (mL/kg) 415.5 (4.3) 74 (0.8)   Total Intake(mL/kg)  895.5 (9.3) 74 (0.8)   Urine (mL/kg/hr) 1400 (0.6) 225 (0.5)   Total Output 1400 225   Net -504.5 -151        Stool Occurrence 1 x      PHYSICAL EXAMINATION: General:  Anxious elderly WM with increased WOB Neuro:  Anxious but intact HEENT:  No LAN Cardiovascular:  hsr rrr Lungs:  Markedly diminished throughout, few distant wheezes scattered Abdomen:  +bs Musculoskeletal:  intact Skin:  warm  LABS:  Lab 07/21/12 0839 07/21/12 0340 07/20/12 0346 07/19/12 0340 07/18/12 0725 07/18/12 0142 07/17/12 1933 07/17/12 1700 07/17/12 1317 07/17/12 1315  HGB -- 14.8 14.9 14.9 -- -- -- -- -- --  WBC -- 13.1* 15.4* 12.5* -- -- -- -- -- --  PLT -- 163 177 165 -- -- -- -- -- --  NA -- 132* 135 136 -- -- -- -- -- --  K -- 4.8 4.4 -- -- -- -- -- -- --  CL -- 99 101 102 -- -- -- -- -- --  CO2 -- 23 24 25  -- -- -- -- -- --  GLUCOSE -- 178* 143* 144* -- -- -- -- -- --  BUN -- 47* 46* 44* -- -- -- -- -- --  CREATININE -- 1.68* 1.75* 1.83* -- -- -- -- -- --  CALCIUM -- 8.7 8.8 8.9 -- -- -- -- -- --  MG -- -- -- -- -- -- 1.8 -- -- --  PHOS -- -- -- -- -- -- -- -- -- --  AST -- -- -- -- -- -- -- -- -- 18  ALT -- -- -- -- -- -- -- -- -- 21  ALKPHOS -- -- -- -- -- -- -- -- -- 116  BILITOT -- -- -- -- -- -- -- -- -- 1.1  PROT -- -- -- -- -- -- -- -- -- 6.4  ALBUMIN -- -- -- -- -- -- -- -- -- 2.7*  APTT -- -- -- -- -- 148* -- -- 31 --  INR -- 2.18* 1.81* 1.19 -- -- -- -- -- --  LATICACIDVEN -- -- -- -- -- -- -- -- -- --  TROPONINI -- -- -- -- <0.30 <0.30 <0.30 -- -- --  PROCALCITON -- -- -- -- -- -- -- -- -- --  PROBNP -- -- -- -- -- -- -- -- -- 1312.0*  O2SATVEN -- -- -- -- -- -- -- -- -- --  PHART 7.330* -- -- -- -- -- -- 7.321* -- --  PCO2ART 38.0 -- -- -- -- -- -- 42.5 -- --  PO2ART 69.5* -- -- -- -- -- -- 259.0* -- --    Lab 07/20/12 2143 07/20/12 1638 07/20/12 1212 07/20/12 0826 07/19/12 2222  GLUCAP 180* 270* 196* 124* 202*    CXR:  No results found.  Principal Problem:  *Acute on  chronic respiratory failure due to AECOPD and presumed PE Active Problems:  DVT (deep venous thrombosis)  Diabetes mellitus  HTN (hypertension)  Hyperlipidemia  CKD (chronic kidney disease) stage 3, GFR 30-59 ml/min  BPH (benign prostatic hypertrophy) with urinary obstruction  Calculus of kidney    ASSESSMENT / PLAN:  PULMONARY A: AECOPD, presumed PE, hypoxia P:   -O2 as needed -abx -steroids -may need intubation  CARDIOVASCULAR A: DVT, likely PE HTN P:  -hold antihypertensives  RENAL A:   CKI P:   Monitor Avoid nephrotoxins  GASTROINTESTINAL A:  No issues P:   Diet as ordered  HEMATOLOGIC A:  No issues P:    INFECTIOUS A:  Doubt PNA P:   Cont abx for purulent bronchitis X 5-7 days  ENDOCRINE A:  DM2 P:   Steroid dose reduced Cont SSI   NEUROLOGIC A:  No issues P:      Brett Canales Minor ACNP [facility] PCCM Pager [phone] till 3 pm If no answer page [phone] 07/21/2012, 11:20 AM   I have interviewed and examined the patient and reviewed the database. I have formulated the assessment and plan as reflected in the note above with amendments made by me.   [name], MD;  PCCM service; Mobile [phone]

## 2012-07-21 NOTE — Clinical Documentation Improvement (Signed)
    CHF DOCUMENTATION CLARIFICATION QUERY  THIS DOCUMENT IS NOT A PERMANENT PART OF THE MEDICAL RECORD  TO RESPOND TO THE THIS QUERY, FOLLOW THE INSTRUCTIONS BELOW:  1. If needed, update documentation for the patient's encounter via the notes activity.  2. Access this query again and click edit on the In [employer].  3. After updating, or not, click F2 to complete all highlighted (required) fields concerning your review. Select "additional documentation in the medical record" OR "no additional documentation provided".  4. Click Sign note button.  5. The deficiency will fall out of your In Basket *Please let us know if you are not able to complete this workflow by phone or e-mail (listed below).  Please update your documentation within the medical record to reflect your response to this query.                                                                                    07/21/12  Dear Dr. Janee Morn, D/ Associates,  In a better effort to capture your patient's severity of illness, reflect appropriate length of stay and utilization of resources, a review of the patient medical record has revealed the following indicators the diagnosis of Heart Failure.    Based on your clinical judgment, please clarify and document in a progress note and/or discharge summary the clinical condition associated with the following supporting information:  In responding to this query please exercise your independent judgment.  The fact that a query is asked, does not imply that any particular answer is desired or expected.  CHF may be a consideration according to the ED notes  Clarification Needed:   Please clarify if CHF in setting of elevated BNP=1312 with lower extremity edema is being considered and note the acuity and type in the pn or d/c summary.  Treatment: Monitoring    Possible Clinical Conditions?   Chronic Systolic Congestive Heart Failure Chronic Diastolic Congestive Heart  Failure Chronic Systolic & Diastolic Congestive Heart Failure Acute Systolic Congestive Heart Failure Acute Diastolic Congestive Heart Failure Acute Systolic & Diastolic Congestive Heart Failure Acute on Chronic Systolic Congestive Heart Failure Acute on Chronic Diastolic Congestive Heart Failure Acute on Chronic Systolic & Diastolic  Congestive Heart Failure Other Condition________________________________________ Cannot Clinically Determine  Supporting Information:  Risk Factors: acute Respiratory Failure, CAP, and Renal Failure  Signs & Symptoms:  Ed note: CHF are also considerations Notably, the patient BNP also suggests an element of heart failure.  complains of new lower extremity edema, the right greater than the left, with pain in his right calf.  Diagnostics:  Component     Latest Ref Rng 07/17/2012         1:15 PM  Pro B Natriuretic peptide (BNP)     0 - 450 pg/mL 1312.0 (H)   Treatment:  Reviewed:  no additional documentation provided ljh  Thank You,  Quaneshia Wareing J Marguriete Wootan J. Ambrose Mantle RN, BSN, MSN/Inf, CCDS Clinical Documentation Specialist [facility] Pager: [phone]  Health Information Management [place]

## 2012-07-22 DIAGNOSIS — J189 Pneumonia, unspecified organism: Secondary | ICD-10-CM | POA: Diagnosis not present

## 2012-07-22 DIAGNOSIS — I2699 Other pulmonary embolism without acute cor pulmonale: Principal | ICD-10-CM

## 2012-07-22 DIAGNOSIS — I82409 Acute embolism and thrombosis of unspecified deep veins of unspecified lower extremity: Secondary | ICD-10-CM | POA: Diagnosis not present

## 2012-07-22 DIAGNOSIS — R0902 Hypoxemia: Secondary | ICD-10-CM | POA: Diagnosis not present

## 2012-07-22 DIAGNOSIS — J9600 Acute respiratory failure, unspecified whether with hypoxia or hypercapnia: Secondary | ICD-10-CM | POA: Diagnosis not present

## 2012-07-22 DIAGNOSIS — J40 Bronchitis, not specified as acute or chronic: Secondary | ICD-10-CM | POA: Insufficient documentation

## 2012-07-22 DIAGNOSIS — J441 Chronic obstructive pulmonary disease with (acute) exacerbation: Secondary | ICD-10-CM | POA: Diagnosis not present

## 2012-07-22 LAB — PROTIME-INR
INR: 2.29 — ABNORMAL HIGH (ref 0.00–1.49)
Prothrombin Time: 24.2 seconds — ABNORMAL HIGH (ref 11.6–15.2)

## 2012-07-22 LAB — CBC
HCT: 43.7 % (ref 39.0–52.0)
MCHC: 33 g/dL (ref 30.0–36.0)
MCV: 94.6 fL (ref 78.0–100.0)
Platelets: 188 10*3/uL (ref 150–400)
RDW: 13.6 % (ref 11.5–15.5)
WBC: 13.5 10*3/uL — ABNORMAL HIGH (ref 4.0–10.5)

## 2012-07-22 LAB — GLUCOSE, CAPILLARY: Glucose-Capillary: 269 mg/dL — ABNORMAL HIGH (ref 70–99)

## 2012-07-22 LAB — BASIC METABOLIC PANEL
GFR calc Af Amer: 44 mL/min — ABNORMAL LOW (ref >90)
GFR calc non Af Amer: 38 mL/min — ABNORMAL LOW (ref >90)
Glucose, Bld: 232 mg/dL — ABNORMAL HIGH (ref 70–99)
Potassium: 4.5 mEq/L (ref 3.5–5.1)
Sodium: 132 mEq/L — ABNORMAL LOW (ref 135–145)

## 2012-07-22 MED ORDER — INSULIN GLARGINE 100 UNIT/ML ~~LOC~~ SOLN
10.0000 [IU] | Freq: Every day | SUBCUTANEOUS | Status: DC
  Administered 2012-07-22: 10 [IU] via SUBCUTANEOUS

## 2012-07-22 MED ORDER — PREDNISONE 20 MG PO TABS
40.0000 mg | ORAL_TABLET | Freq: Every day | ORAL | Status: DC
  Administered 2012-07-23 – 2012-07-28 (×6): 40 mg via ORAL
  Filled 2012-07-22 (×8): qty 2

## 2012-07-22 MED ORDER — WARFARIN SODIUM 1 MG PO TABS
1.0000 mg | ORAL_TABLET | Freq: Once | ORAL | Status: AC
  Administered 2012-07-22: 1 mg via ORAL
  Filled 2012-07-22: qty 1

## 2012-07-22 NOTE — Progress Notes (Signed)
ANTICOAGULATION CONSULT NOTE - Follow Up Consult  Pharmacy Consult for Heparin, Warfarin Indication: DVT, PE  No Known Allergies  Patient Measurements: Height: 5\' 9"  (175.3 cm) Weight: 218 lb 11.1 oz (99.2 kg) IBW/kg (Calculated) : 70.7  Heparin Dosing Weight: 90 kg  Vital Signs: Temp: 97.1 F (36.2 C) (02/04 0800) Temp src: Axillary (02/04 0800) BP: 103/54 mmHg (02/04 0400) Pulse Rate: 81  (02/04 0400)  Labs:  Basename 07/22/12 0342 07/21/12 2001 07/21/12 1134 07/21/12 0340 07/20/12 0346  HGB 14.4 -- -- 14.8 --  HCT 43.7 -- -- 44.0 45.7  PLT 188 -- -- 163 177  APTT -- -- -- -- --  LABPROT 24.2* -- -- 23.3* 20.3*  INR 2.29* -- -- 2.18* 1.81*  HEPARINUNFRC 0.50 0.31 0.42 -- --  CREATININE 1.70* -- -- 1.68* 1.75*  CKTOTAL -- -- -- -- --  CKMB -- -- -- -- --  TROPONINI -- -- -- -- --    Estimated Creatinine Clearance: 43.6 ml/min (by C-G formula based on Cr of 1.7).  Assessment:  77 yom presents 1/30 with SOB, LE dopplers reveals LLE DVT. Unable to obtain CT scan or VQ scan 1/31. D-dimer elevated on 1/30. No recent procedures, had BL cataract surgery in Oct. Wife states he bleeds easily but per medication list not on any antiplatelets or blood thinners. IV heparin started 1/30, warfarin started 1/31.   Today is Day #5 of 5 day minimum overlap with warfarin/heparin. (INR > 2 for >24h)  Heparin level (0.5) is therpaeutic  INR (2.29) remains therapeutic today.  Continue conservative warfarin dose due to presence of potential drug interaction with Levaquin (possible increased INR response)  CBC remains stable/wnl   Goal of Therapy:  INR 2-3 Heparin level 0.3-0.7 units/ml Monitor platelets by anticoagulation protocol: Yes   Plan:   D/C Heparin IV infusion  Wafarin 1mg  PO x1 today  Daily INR, CBC  Warfarin education prior to discharge   Lynann Beaver PharmD, BCPS Pager [phone] 07/22/2012 9:27 AM

## 2012-07-22 NOTE — Progress Notes (Signed)
Occupational Therapy Treatment Patient Details Name: Alexander Kirby MRN: [id number] DOB: Jun 02, 1935 Today's Date: 07/22/2012 Time: 4098-1191 OT Time Calculation (min): 18 min  OT Assessment / Plan / Recommendation Comments on Treatment Session Pt with less dyspnea today.  Cued for safety with sitting and for pursed lip breathing.  Pt tends to self-limit activities as he is afraid of starting a coughing spell    Follow Up Recommendations  Home health OT (vs snf depending upon progress.  Activity limited)    Barriers to Discharge       Equipment Recommendations       Recommendations for Other Services    Frequency Min 2X/week   Plan      Precautions / Restrictions Precautions Precautions: Fall Restrictions Weight Bearing Restrictions: No   Pertinent Vitals/Pain No pain reported.  Sats 77 (no line visible to see accuracy but no distress) to 91% on 5 liters 02    ADL  Toilet Transfer: Simulated;Min guard (lines/leads.  Cues to reach back and attend to chair position as he would have sat on armrest.  Pt tends to keep both hands on walker when he sits.  ) Toilet Transfer Method: Sit to stand Toilet Transfer Equipment:  (bed to recliner) Transfers/Ambulation Related to ADLs: Pt walked 5 feet to chair.  He did not feel like doing any adl activities at this time.  Pt self-limits activities as he is afraid he will start a coughing spell.  Prefers to have therapy come in late am ADL Comments: offered adls, pt declined.  Did not feel he could attempt socks today    OT Diagnosis:    OT Problem List:   OT Treatment Interventions:     OT Goals Acute Rehab OT Goals Time For Goal Achievement: 08/02/12 ADL Goals Pt Will Transfer to Toilet: with supervision;Ambulation;3-in-1 ADL Goal: Toilet Transfer - Progress: Progressing toward goals (slowly) Miscellaneous OT Goals Miscellaneous OT Goal #2: Pt will demonstrate pursed lip breathing when dyspnea present without cues OT Goal: Miscellaneous  Goal #2 - Progress:  (needs cues)  Visit Information  Last OT Received On: 07/22/12 Assistance Needed: +2    Subjective Data      Prior Functioning       Cognition  Cognition Overall Cognitive Status: Appears within functional limits for tasks assessed/performed Arousal/Alertness: Awake/alert Orientation Level: Appears intact for tasks assessed Behavior During Session: [facility] for tasks performed    Mobility  Bed Mobility Supine to Sit: 5: Supervision Transfers Sit to Stand: 4: Min guard    Exercises      Balance     End of Session OT - End of Session Activity Tolerance: Patient limited by fatigue Patient left: in chair;with call bell/phone within reach  GO     [facility] 07/22/2012, 8:37 AM Marica Otter, OTR/L [phone] 07/22/2012

## 2012-07-22 NOTE — Progress Notes (Signed)
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 Became more hypoxic. Increased wob and PCCM was consulted 2-3.  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 Became more hypoxic. Increased wob and PCCM was consulted 2-3.  He has had great difficulty mobilizing secretions and is now better after successful use of flutter valve    SUBJECTIVE:  Very anxious - improved after low dose Xanax   VITAL SIGNS: Temp:  [97.1 F (36.2 C)-98.6 F (37 C)] 97.1 F (36.2 C) (02/04 0800) Pulse Rate:  [81-107] 81  (02/04 0400) Resp:  [20-25] 23  (02/04 0400) BP: (103-150)/(54-83) 103/54 mmHg (02/04 0400) SpO2:  [93 %-96 %] 94 % (02/04 0934) Weight:  [99.2 kg (218 lb 11.1 oz)] 99.2 kg (218 lb 11.1 oz) (02/04 0400) HEMODYNAMICS:   VENTILATOR SETTINGS:   INTAKE / OUTPUT: Intake/Output      02/03 0701 - 02/04 0700 02/04 0701 - 02/05 0700   P.O.     I.V. (mL/kg) 430 (4.3)    Total Intake(mL/kg) 430 (4.3)    Urine (mL/kg/hr) 950 (0.4)    Total Output 950    Net -520           PHYSICAL EXAMINATION: General:  Anxious elderly WM with decreased WOB Neuro: Less  anxious but intact HEENT:  No LAN Cardiovascular:  hsr rrr Lungs:  Markedly diminished throughout, few distant wheezes scattered Abdomen:  +bs Musculoskeletal:  intact Skin:  warm  LABS:  Lab 07/22/12 0342 07/21/12 0839 07/21/12 0340  07/20/12 0346 07/18/12 0725 07/18/12 0142 07/17/12 1933 07/17/12 1700 07/17/12 1317 07/17/12 1315  HGB 14.4 -- 14.8 14.9 -- -- -- -- -- --  WBC 13.5* -- 13.1* 15.4* -- -- -- -- -- --  PLT 188 -- 163 177 -- -- -- -- -- --  NA 132* -- 132* 135 -- -- -- -- -- --  K 4.5 -- 4.8 -- -- -- -- -- -- --  CL 99 -- 99 101 -- -- -- -- -- --  CO2 24 -- 23 24 -- -- -- -- -- --  GLUCOSE 232* -- 178* 143* -- -- -- -- -- --  BUN 51* -- 47* 46* -- -- -- -- -- --  CREATININE 1.70* -- 1.68* 1.75* -- -- -- -- -- --  CALCIUM 8.8 -- 8.7 8.8 -- -- -- -- -- --  MG -- -- -- -- -- -- 1.8 -- -- --  PHOS -- -- -- -- -- -- -- -- -- --  AST -- -- -- -- -- -- -- -- -- 18  ALT -- -- -- -- -- -- -- -- -- 21  ALKPHOS -- -- -- -- -- -- -- -- -- 116  BILITOT -- -- -- -- -- -- -- -- -- 1.1  PROT -- -- -- -- -- -- -- -- -- 6.4  ALBUMIN -- -- -- -- -- -- -- -- --  2.7*  APTT -- -- -- -- -- 148* -- -- 31 --  INR 2.29* -- 2.18* 1.81* -- -- -- -- -- --  LATICACIDVEN -- -- -- -- -- -- -- -- -- --  TROPONINI -- -- -- -- <0.30 <0.30 <0.30 -- -- --  PROCALCITON -- -- -- -- -- -- -- -- -- --  PROBNP -- -- -- -- -- -- -- -- -- 1312.0*  O2SATVEN -- -- -- -- -- -- -- -- -- --  PHART -- 7.330* -- -- -- -- -- 7.321* -- --  PCO2ART -- 38.0 -- -- -- -- -- 42.5 -- --  PO2ART -- 69.5* -- -- -- -- -- 259.0* -- --    Lab 07/21/12 2122 07/21/12 1738 07/21/12 1145 07/21/12 0937 07/20/12 2143  GLUCAP 260* 269* 207* 202* 180*    CXR:  No results found.   Principal Problem:  *Acute on chronic respiratory failure due to AECOPD and presumed PE Active Problems:  DVT (deep venous thrombosis)  Diabetes mellitus  HTN (hypertension)  Hyperlipidemia  CKD (chronic kidney disease) stage 3, GFR 30-59 ml/min  BPH (benign prostatic hypertrophy) with urinary obstruction  Calculus of kidney    ASSESSMENT / PLAN:  PULMONARY A: AECOPD, presumed PE, hypoxia P:   -O2 as needed, wean as tolerated -abx -steroids   CARDIOVASCULAR A: DVT,  likely PE HTN P:  -hold antihypertensives  RENAL A:   CKI P:   Monitor Avoid nephrotoxins   INFECTIOUS A:  Doubt PNA P:   Cont abx for purulent bronchitis X 5-7 days  ENDOCRINE A:  DM2 P:   Steroid dose reduced Cont SSI   NEUROLOGIC A:  No issues P:      [name] ACNP Adolph Pollack PCCM Pager [phone] till 3 pm If no answer page [phone] 07/22/2012, 9:46 AM   PCCM Attending: Much improved Agree with above Transfer to telemetry 2/04 Change steroids to PO beginning 2/05 and complete taper over next 5-7 days Complete 7 days abx Cont nebulized steroids and BDs for now Upon discharge, he will should go home on Symbicort (his previous maintenance med) and PRN albuterol He should be ready for discharge once his PT-INR is in therapeutic range He should undergo 6 mos anticoagulation for DVT (and possible PE) Assess home O2 needs prior to discharge  Indicated for resting RA SpO2 < 90% or ambulatory SpO2 < 88%  I have ordered removal of Foley cath 2/04 He has had problems with urinary retention, therefore will check post void bladder scans X 3 Dr Isabel Caprice is his Urologist  I have arranged follow up: Thurs 08/07/12 @ 3 PM with Dr Marchelle Gearing   PCCM will sign off. Please call if we can be of further assistance  [name], MD ; [facility] [phone].  After 5:30 PM or weekends, call [phone]

## 2012-07-22 NOTE — Progress Notes (Signed)
Physical Therapy Treatment Patient Details Name: Alexander Kirby MRN: [id number] DOB: 11/11/1934 Today's Date: 07/22/2012 Time: 1191-4782 PT Time Calculation (min): 19 min  PT Assessment / Plan / Recommendation Comments on Treatment Session  Pt agreeable to getting to chair, however declined ambulation due to it being early in the morning and he did not want to have a "coughing fit."  All mobility performed on 5L O2.  SaO2 dropped to 77% (unsure of how accurate this was due to pt not very labored in breathing).  SaO2 was 91-92% at best. Pt seems to be self limiting when performing mobility.     Follow Up Recommendations  Home health PT;SNF     Does the patient have the potential to tolerate intense rehabilitation     Barriers to Discharge        Equipment Recommendations  None recommended by PT    Recommendations for Other Services    Frequency Min 3X/week   Plan Discharge plan needs to be updated    Precautions / Restrictions Precautions Precautions: Fall Precaution Comments: lines, O2 Restrictions Weight Bearing Restrictions: No   Pertinent Vitals/Pain No pain mentioned    Mobility  Bed Mobility Bed Mobility: Supine to Sit Supine to Sit: 5: Supervision;HOB elevated Details for Bed Mobility Assistance: Pt requires increased time to complete tasks with min cues for technique and aligning hips on EOB.  Transfers Transfers: Sit to Stand;Stand to Sit Sit to Stand: 4: Min guard;With upper extremity assist;From bed Stand to Sit: 4: Min guard;To chair/3-in-1 Stand Pivot Transfers: 4: Min guard;4: Min assist Details for Transfer Assistance: Min/guard to min assist for safety with max cues for hand placement and safety.  Ambulation/Gait Ambulation/Gait Assistance: 4: Min guard;4: Min Environmental consultant (Feet): 5 Feet Assistive device: Rolling walker Ambulation/Gait Assistance Details: Pt able to take some steps from bed to chair, however declined futher ambulation due to  not being awake yet and early in the morning.  Gait Pattern: Step-to pattern;Decreased step length - right;Decreased step length - left Gait velocity: decreased General Gait Details: Pts SaO2 dropped to 77% (unclear whether or not this was acurate due to pts breathing not that labored).  Remained on 5L O2 throughout.  Stairs: No    Exercises     PT Diagnosis:    PT Problem List:   PT Treatment Interventions:     PT Goals Acute Rehab PT Goals PT Goal Formulation: With patient Time For Goal Achievement: 08/02/12 Potential to Achieve Goals: Fair Pt will go Supine/Side to Sit: with modified independence PT Goal: Supine/Side to Sit - Progress: Progressing toward goal Pt will go Sit to Stand: with modified independence PT Goal: Sit to Stand - Progress: Progressing toward goal Pt will go Stand to Sit: with modified independence PT Goal: Stand to Sit - Progress: Progressing toward goal Pt will Ambulate: 1 - 15 feet;with least restrictive assistive device;with supervision PT Goal: Ambulate - Progress: Progressing toward goal  Visit Information  Last PT Received On: 07/22/12 Assistance Needed: +2    Subjective Data  Subjective: I don't do well this early in the morning.  Patient Stated Goal: home   Cognition  Cognition Overall Cognitive Status: Appears within functional limits for tasks assessed/performed Area of Impairment: Following commands;Safety/judgement Arousal/Alertness: Awake/alert Orientation Level: Appears intact for tasks assessed Behavior During Session: Agitated Following Commands: Follows one step commands inconsistently Safety/Judgement: Decreased awareness of safety precautions;Decreased safety judgement for tasks assessed Safety/Judgement - Other Comments: Pt continues to require cues for safety  and hand placement.     Balance     End of Session PT - End of Session Equipment Utilized During Treatment: Oxygen Activity Tolerance: Patient limited by  fatigue Patient left: in chair;with call bell/phone within reach Nurse Communication: Mobility status   GP     Vista Deck 07/22/2012, 8:37 AM

## 2012-07-22 NOTE — Progress Notes (Signed)
[facility] PROGRESS NOTE  Alexander Kirby:[id number] DOB: 11/09/1934 DOA: 07/17/2012 PCP: Ailene Ravel, MD  Assessment/Plan:  #1 acute respiratory failure Likely multifactorial in nature secondary to probable acute PE, probable community-acquired pneumonia per patchy infiltrate seen on chest x-ray with symptoms of a productive cough that have been ongoing for several weeks, in the setting of COPD. patient sitting upright in bed and tripod stance yesterday. Improving today.. Patient using some accessory muscles of respiration. Patient currently 4 L nasal cannula with sats of 94%. Sputum Gram stain and culture pending. Urine Legionella antigen negative. Urine pneumococcus antigen negative. Continue empiric IV Levaquin and IV heparin and coumadin. Continue oxygen, continue nebs. Continue IV steroid taper. Chest PT. Unable to get CT angiogram secondary to chronic kidney disease. Unable to get VQ scan as patient can't lay flat. Follow. We'll also place on Ativan as needed. Repeat ABG yesterday with a pH of 7.33, PCO2 of 38, PO2 of 69.5. Patient is tenous with high risk for intubation. PCCM is following and appreciate input and recommendations. Will consult critical care medicine for further evaluation and management.  #2 hypoxia Likely multifactorial secondary to probable PE in a patient with left lower extremity DVT per Doppler ultrasound in the setting of probable community-acquired pneumonia per chest x-ray findings and symptoms. Patient was tachycardic on presentation which is improved. Sputum Gram stain and cultures pending. Urine Legionella antigen negative. Urine pneumococcus antigen negative. Continue IV heparin. Continue coumadin. Continue IV Levaquin. Continue O2. Continue nebs. Follow.  #3 left lower extremity DVT Patient was started on IV heparin in the ED and a such a hypercoagulable panel was unable to be obtained. Continue IV heparin overlap with Coumadin   #4 probable PE Patient  had presented with severe hypoxia with sats in 80% requiring a face mask. Patient was tachycardic. Lower extremity Dopplers were positive for left lower extremity DVT. Unable to get CT angiogram secondary to renal function. Heparin was started in the emergency room and a such unable to obtain a hypercoagulable panel. Unable to get VQ scan as patient could not lay flat. 2-D echo with EF of 65-70%. Right ventricle was mildly to moderately dilated. Intraventricular septum was D-shaped with evidence for right ventricular pressure/volume overload which could be consistent with a large PE. Patient did have a slight drop in his blood pressure overnight with systolic in the 70s 3 days ago. Current blood pressure systolic is 103/54. Monitor blood pressure closely for hypotension and if patient develops hypotension will need IV fluids. Continue empiric IV heparin overlap with Coumadin.  #5 COPD exac On presentation patient did have some wheezing. Wheezing improved but some poor to fair air movement and some use of accessory muscles of respiration. Admission ABG did show a pH of 7.32 however PCO2 was 43 and PO2 was 259. Wean oxygen. Continue empiric  Levaquin, nebs. Solu-Medrol 80 mg IV q8. Repeat ABG yesterday with a pH of 7.3, PCO2 of 38, PO2 of 70.  Chest PT. Flutter valve. Patient is on mucolytics. Patient had increased risk for intubation. Critical care is following and appreciate input and recommendations.   #6 probable community-acquired pneumoniavs bronchitis Patient had presented with several weeks of productive cough. Chest x-ray with patchy infiltrates. Patient also did have a leukocytosis on admission. Sputum Gram stain and cultures pending. Urine Legionella antigen negative. Urine pneumococcus antigen negative. Leukocytosis is fluctuating in part also due to steriods. Continue empiric Levaquin, oxygen, nebs, Robitussin as needed.  #7 acute on chronic kidney disease stage  III Her PCPs records patient's  last creatinine was 1.79 on 09/28/2011. On admission creatinine was 1.99.  Renal ultrasound with slight thinning of the renal cortices. No acute abnormalities.  ACE inhibitor and diuretics are on hold. Renal function has improved and close to baseline today at 1.70. Follow.  #8  Hx of anemia H&H is stable.  #9 leukocytosis Likely reactive secondary to probable PE and probable community-acquired pneumonia in setting of steriods. Sputum Gram stain and culture pending. Urinalysis is unremarkable. Urine culture with 75,000 of enterococcus species sensitive to Levaquin.  Chest x-ray with patchy infiltrates. Urine Legionella antigen negative. Urine pneumococcus antigen negative. Leukocytosis is fluctuating in part also due to steriods.  Continue empiric Levaquin.  #10 diabetes mellitus Hemoglobin A1c is 8.4. CBGs have ranged from 207-269. Increase Lantus to 10 units daily. Continue sliding scale insulin.  #11 hypertension Stable. Continue to hold patient's ACE inhibitor and diuretics secondary to problem #7 and borderline BP. Follow.  #12 prophylaxis PPI for GI prophylaxis. On full dose heparin for DVT.   Code Status: Full Family Communication: Updated patient. No family at bedtime. Disposition Plan: Home vs SNF when medically stable   Consultants:  PC CM: Dr Sung Amabile 07/21/2012  Procedures:  Chest x-ray 07/17/2012  Lower extremity Dopplers 07/17/2012  2-D echo 07/18/2012  Renal ultrasound 07/18/2012  Antibiotics:  IV Levaquin 07/17/2012--->07/19/12  Oral levaquin 07/19/12  HPI/Subjective: Patient sitting upright in chair. Some use of accessory muscles of respiration. Patient states feeling some better than yesterday.  Objective: Filed Vitals:   07/22/12 0232 07/22/12 0400 07/22/12 0800 07/22/12 0934  BP:  103/54    Pulse:  81    Temp:  98.6 F (37 C) 97.1 F (36.2 C)   TempSrc:  Oral Axillary   Resp:  23    Height:      Weight:  99.2 kg (218 lb 11.1 oz)    SpO2: 94%  94%  94%    Intake/Output Summary (Last 24 hours) at 07/22/12 0957 Last data filed at 07/22/12 0600  Gross per 24 hour  Intake    393 ml  Output    725 ml  Net   -332 ml   Filed Weights   07/19/12 0400 07/20/12 0410 07/22/12 0400  Weight: 95.6 kg (210 lb 12.2 oz) 96.8 kg (213 lb 6.5 oz) 99.2 kg (218 lb 11.1 oz)    Exam:   General: Patient with some use of accessory muscles of respiration. Speaking in full sentences. Sitting in chair.  Cardiovascular: Tachycardia no m/r/g. 1 + B R > L LE edem  Respiratory: min wheezing. Tight. Use of accessory muscles of respiration  Abdomen: Soft/NT/ND/+BS  Data Reviewed: Basic Metabolic Panel:  Lab 07/22/12 1610 07/21/12 0340 07/20/12 0346 07/19/12 0340 07/18/12 0142 07/17/12 1933  NA 132* 132* 135 136 137 --  K 4.5 4.8 4.4 4.2 4.5 --  CL 99 99 101 102 102 --  CO2 24 23 24 25 26  --  GLUCOSE 232* 178* 143* 144* 147* --  BUN 51* 47* 46* 44* 42* --  CREATININE 1.70* 1.68* 1.75* 1.83* 1.81* --  CALCIUM 8.8 8.7 8.8 8.9 8.9 --  MG -- -- -- -- -- 1.8  PHOS -- -- -- -- -- --   Liver Function Tests:  Lab 07/17/12 1315  AST 18  ALT 21  ALKPHOS 116  BILITOT 1.1  PROT 6.4  ALBUMIN 2.7*   No results found for this basename: LIPASE:5,AMYLASE:5 in the last 168 hours No results found  for this basename: AMMONIA:5 in the last 168 hours CBC:  Lab 07/22/12 0342 07/21/12 0340 07/20/12 0346 07/19/12 0340 07/18/12 0142 07/17/12 1315  WBC 13.5* 13.1* 15.4* 12.5* 13.9* --  NEUTROABS -- -- -- -- -- 17.2*  HGB 14.4 14.8 14.9 14.9 15.7 --  HCT 43.7 44.0 45.7 46.1 47.6 --  MCV 94.6 94.4 95.2 95.4 95.6 --  PLT 188 163 177 165 152 --   Cardiac Enzymes:  Lab 07/18/12 0725 07/18/12 0142 07/17/12 1933 07/17/12 1315  CKTOTAL -- -- -- --  CKMB -- -- -- --  CKMBINDEX -- -- -- --  TROPONINI <0.30 <0.30 <0.30 <0.30   BNP (last 3 results)  Basename 07/17/12 1315  PROBNP 1312.0*   CBG:  Lab 07/21/12 2122 07/21/12 1738 07/21/12 1145 07/21/12 0937  07/20/12 2143  GLUCAP 260* 269* 207* 202* 180*    Recent Results (from the past 240 hour(s))  MRSA PCR SCREENING     Status: Normal   Collection Time   07/17/12  6:46 PM      Component Value Range Status Comment   MRSA by PCR NEGATIVE  NEGATIVE Final   URINE CULTURE     Status: Normal   Collection Time   07/17/12 11:07 PM      Component Value Range Status Comment   Specimen Description URINE, CLEAN CATCH   Final    Special Requests NONE   Final    Culture  Setup Time 07/18/2012 09:18   Final    Colony Count 75,000 COLONIES/ML   Final    Culture ENTEROCOCCUS SPECIES   Final    Report Status 07/21/2012 FINAL   Final    Organism ID, Bacteria ENTEROCOCCUS SPECIES   Final      Studies: No results found.  Scheduled Meds:    . acetylcysteine  2 mL Nebulization Q6H  . albuterol  2.5 mg Nebulization Q6H  . antiseptic oral rinse  15 mL Mouth Rinse BID  . budesonide  0.25 mg Nebulization Q6H  . fluticasone  1 spray Each Nare Daily  . guaiFENesin  1,200 mg Oral BID  . insulin aspart  0-20 Units Subcutaneous TID WC  . insulin glargine  10 Units Subcutaneous QHS  . ipratropium  0.5 mg Nebulization Q6H  . levofloxacin  750 mg Oral Q48H  . methylPREDNISolone (SOLU-MEDROL) injection  40 mg Intravenous Q12H  . mometasone-formoterol  2 puff Inhalation BID  . pantoprazole  40 mg Oral Daily  . sodium chloride  3 mL Intravenous Q12H  . Tamsulosin HCl  0.4 mg Oral QPC supper  . warfarin  1 mg Oral ONCE-1800  . Warfarin - Pharmacist Dosing Inpatient   Does not apply q1800   Continuous Infusions:    Principal Problem:  *Acute respiratory failure Active Problems:  DVT (deep venous thrombosis)  COPD with exacerbation  Diabetes mellitus  HTN (hypertension)  Hyperlipidemia  CKD (chronic kidney disease) stage 3, GFR 30-59 ml/min  BPH (benign prostatic hypertrophy) with urinary obstruction  Calculus of kidney  PE, presumed  Bronchitis    Time spent: > 35  mins    [facility]  [facility] Pager [phone]. If 8PM-8AM, please contact night-coverage at [email or web address], password [facility] 07/22/2012, 9:57 AM  LOS: 5 days

## 2012-07-23 ENCOUNTER — Inpatient Hospital Stay (HOSPITAL_COMMUNITY): Payer: BC Managed Care – PPO

## 2012-07-23 DIAGNOSIS — N189 Chronic kidney disease, unspecified: Secondary | ICD-10-CM | POA: Diagnosis not present

## 2012-07-23 DIAGNOSIS — J449 Chronic obstructive pulmonary disease, unspecified: Secondary | ICD-10-CM | POA: Diagnosis not present

## 2012-07-23 DIAGNOSIS — N179 Acute kidney failure, unspecified: Secondary | ICD-10-CM | POA: Diagnosis not present

## 2012-07-23 DIAGNOSIS — I82409 Acute embolism and thrombosis of unspecified deep veins of unspecified lower extremity: Secondary | ICD-10-CM | POA: Diagnosis not present

## 2012-07-23 DIAGNOSIS — J9600 Acute respiratory failure, unspecified whether with hypoxia or hypercapnia: Secondary | ICD-10-CM | POA: Diagnosis not present

## 2012-07-23 LAB — BASIC METABOLIC PANEL
BUN: 48 mg/dL — ABNORMAL HIGH (ref 6–23)
Chloride: 99 mEq/L (ref 96–112)
GFR calc Af Amer: 45 mL/min — ABNORMAL LOW (ref >90)
Glucose, Bld: 211 mg/dL — ABNORMAL HIGH (ref 70–99)
Potassium: 4.8 mEq/L (ref 3.5–5.1)

## 2012-07-23 LAB — CBC
Hemoglobin: 13.9 g/dL (ref 13.0–17.0)
MCH: 31.1 pg (ref 26.0–34.0)
MCV: 94.6 fL (ref 78.0–100.0)
RBC: 4.47 MIL/uL (ref 4.22–5.81)

## 2012-07-23 LAB — PROTIME-INR: Prothrombin Time: 21.7 seconds — ABNORMAL HIGH (ref 11.6–15.2)

## 2012-07-23 LAB — GLUCOSE, CAPILLARY: Glucose-Capillary: 165 mg/dL — ABNORMAL HIGH (ref 70–99)

## 2012-07-23 MED ORDER — VANCOMYCIN HCL 10 G IV SOLR
1500.0000 mg | INTRAVENOUS | Status: AC
  Administered 2012-07-24 – 2012-07-26 (×3): 1500 mg via INTRAVENOUS
  Filled 2012-07-23 (×3): qty 1500

## 2012-07-23 MED ORDER — VANCOMYCIN HCL 10 G IV SOLR
2000.0000 mg | Freq: Once | INTRAVENOUS | Status: AC
  Administered 2012-07-23: 2000 mg via INTRAVENOUS
  Filled 2012-07-23: qty 2000

## 2012-07-23 MED ORDER — WARFARIN SODIUM 2 MG PO TABS
2.0000 mg | ORAL_TABLET | Freq: Once | ORAL | Status: AC
  Administered 2012-07-23: 2 mg via ORAL
  Filled 2012-07-23: qty 1

## 2012-07-23 MED ORDER — INSULIN GLARGINE 100 UNIT/ML ~~LOC~~ SOLN
15.0000 [IU] | Freq: Every day | SUBCUTANEOUS | Status: DC
  Administered 2012-07-23 – 2012-08-03 (×12): 15 [IU] via SUBCUTANEOUS

## 2012-07-23 MED ORDER — ENALAPRIL MALEATE 10 MG PO TABS
10.0000 mg | ORAL_TABLET | Freq: Every day | ORAL | Status: DC
  Administered 2012-07-23 – 2012-07-27 (×5): 10 mg via ORAL
  Filled 2012-07-23 (×5): qty 1

## 2012-07-23 MED ORDER — PIPERACILLIN-TAZOBACTAM 3.375 G IVPB
3.3750 g | Freq: Three times a day (TID) | INTRAVENOUS | Status: DC
  Administered 2012-07-23 – 2012-07-31 (×24): 3.375 g via INTRAVENOUS
  Filled 2012-07-23 (×26): qty 50

## 2012-07-23 MED ORDER — HYDROCHLOROTHIAZIDE 25 MG PO TABS
25.0000 mg | ORAL_TABLET | Freq: Every day | ORAL | Status: DC
  Administered 2012-07-23 – 2012-07-26 (×4): 25 mg via ORAL
  Filled 2012-07-23 (×4): qty 1

## 2012-07-23 MED ORDER — ENALAPRIL-HYDROCHLOROTHIAZIDE 10-25 MG PO TABS: 1.0000 | ORAL_TABLET | Freq: Every morning | ORAL | Status: DC

## 2012-07-23 NOTE — Progress Notes (Signed)
[facility] PROGRESS NOTE  Alexander Kirby OZH:[id number] DOB: 05-Feb-1935 DOA: 07/17/2012 PCP: Ailene Ravel, MD  Assessment/Plan: #1 acute respiratory failure  Likely multifactorial in nature secondary to probable acute PE, probable community-acquired pneumonia per patchy infiltrate seen on chest x-ray with symptoms of a productive cough that have been ongoing for several weeks- chest x ray shows worsening patchy infiltrate but BNP decreased- will broaden abx to vanc/zosyn and watch for improvment, in the setting of COPD. patient sitting upright in bed and tripod stance. Patient using some accessory muscles of respiration. Patient currently 4 L nasal cannula  Sputum Gram stain and culture pending. Urine Legionella antigen negative. Urine pneumococcus antigen negative.  Continue oxygen, continue nebs. PO steroids. Chest PT. Unable to get CT angiogram secondary to chronic kidney disease. Unable to get VQ scan as patient can't lay flat. Follow. We'll also place on Ativan as needed.  Patient is tenous with high risk for intubation. Pulm signed off   #2 hypoxia  Likely multifactorial secondary to probable PE in a patient with left lower extremity DVT per Doppler ultrasound in the setting of probable community-acquired pneumonia per chest x-ray findings and symptoms. Patient was tachycardic on presentation which is improved. Sputum Gram stain and cultures pending. Urine Legionella antigen negative. Urine pneumococcus antigen negative. See above   #3 left lower extremity DVT  Patient was started on IV heparin in the ED and a such a hypercoagulable panel was unable to be obtained. Coumadin    #4 probable PE  Patient had presented with severe hypoxia with sats in 80% requiring a face mask. Patient was tachycardic. Lower extremity Dopplers were positive for left lower extremity DVT. Unable to get CT angiogram secondary to renal function. Heparin was started in the emergency room and a such unable to  obtain a hypercoagulable panel. Unable to get VQ scan as patient could not lay flat. 2-D echo with EF of 65-70%. Right ventricle was mildly to moderately dilated. Intraventricular septum was D-shaped with evidence for right ventricular pressure/volume overload which could be consistent with a large PE.    #5 COPD exac  On presentation patient did have some wheezing. Wheezing improved but some poor to fair air movement and some use of accessory muscles of respiration. PO steroids  Chest PT. Flutter valve. Patient is on mucolytics.   #6 probable community-acquired pneumoniavs bronchitis  Patient had presented with several weeks of productive cough. Chest x-ray with patchy infiltrates. Patient also did have a leukocytosis on admission. Sputum Gram stain and cultures pending. Urine Legionella antigen negative. Urine pneumococcus antigen negative. Leukocytosis is fluctuating in part also due to steriods. Broaden abx, oxygen, nebs, Robitussin as needed.   #7 acute on chronic kidney disease stage III  Her PCPs records patient's last creatinine was 1.79 on 09/28/2011. On admission creatinine was 1.99. Renal ultrasound with slight thinning of the renal cortices. No acute abnormalities. ACE inhibitor and diuretics are on hold. Renal function has improved and close to baseline today at 1.70. Follow.   #8 Hx of anemia  H&H is stable.   #9 leukocytosis  Likely reactive secondary to probable PE and probable community-acquired pneumonia in setting of steriods. Sputum Gram stain and culture pending. Urinalysis is unremarkable. Urine culture with 75,000 of enterococcus species sensitive to Levaquin. Chest x-ray with patchy infiltrates. Urine Legionella antigen negative. Urine pneumococcus antigen negative. Leukocytosis is fluctuating in part also due to steriods.   #10 diabetes mellitus  Hemoglobin A1c is 8.4. CBGs have ranged from 207-269.  Increase Lantus to 10 units daily. Continue sliding scale insulin.   #11  hypertension  Stable.  Follow.  Resume home meds   #12 prophylaxis  PPI for GI prophylaxis. On full dose heparin for DVT.    Code Status: Full  Family Communication: Updated patient. No family at bedtime.  Disposition Plan: Home vs SNF when medically stable    Consultants:  PC CM: Dr Sung Amabile 07/21/2012- signed off 2/4   Procedures:  Chest x-ray 07/17/2012  Lower extremity Dopplers 07/17/2012  2-D echo 07/18/2012  Renal ultrasound 07/18/2012   Antibiotics:  IV Levaquin 07/17/2012--->07/19/12  Oral levaquin 07/19/12   HPI/Subjective: Feels like breathing is worse today   Objective: Filed Vitals:   07/22/12 2054 07/23/12 0234 07/23/12 0524 07/23/12 0900  BP: 147/81  148/89   Pulse: 87  89   Temp: 97.3 F (36.3 C)  97.8 F (36.6 C)   TempSrc: Oral  Oral   Resp: 22  22   Height:      Weight:      SpO2: 92% 94% 95% 93%    Intake/Output Summary (Last 24 hours) at 07/23/12 0949 Last data filed at 07/23/12 1610  Gross per 24 hour  Intake    480 ml  Output    350 ml  Net    130 ml   Filed Weights   07/19/12 0400 07/20/12 0410 07/22/12 0400  Weight: 95.6 kg (210 lb 12.2 oz) 96.8 kg (213 lb 6.5 oz) 99.2 kg (218 lb 11.1 oz)    Exam:   General:  A+Ox3, NAD  Cardiovascular: rrr  Respiratory: dec BS b/l, rhonci  Abdomen: +BS, soft, NT  Data Reviewed: Basic Metabolic Panel:  Lab 07/23/12 9604 07/22/12 0342 07/21/12 0340 07/20/12 0346 07/19/12 0340 07/17/12 1933  NA 132* 132* 132* 135 136 --  K 4.8 4.5 4.8 4.4 4.2 --  CL 99 99 99 101 102 --  CO2 27 24 23 24 25  --  GLUCOSE 211* 232* 178* 143* 144* --  BUN 48* 51* 47* 46* 44* --  CREATININE 1.65* 1.70* 1.68* 1.75* 1.83* --  CALCIUM 8.9 8.8 8.7 8.8 8.9 --  MG -- -- -- -- -- 1.8  PHOS -- -- -- -- -- --   Liver Function Tests:  Lab 07/17/12 1315  AST 18  ALT 21  ALKPHOS 116  BILITOT 1.1  PROT 6.4  ALBUMIN 2.7*   No results found for this basename: LIPASE:5,AMYLASE:5 in the last 168 hours No results  found for this basename: AMMONIA:5 in the last 168 hours CBC:  Lab 07/23/12 0445 07/22/12 0342 07/21/12 0340 07/20/12 0346 07/19/12 0340 07/17/12 1315  WBC 14.2* 13.5* 13.1* 15.4* 12.5* --  NEUTROABS -- -- -- -- -- 17.2*  HGB 13.9 14.4 14.8 14.9 14.9 --  HCT 42.3 43.7 44.0 45.7 46.1 --  MCV 94.6 94.6 94.4 95.2 95.4 --  PLT 190 188 163 177 165 --   Cardiac Enzymes:  Lab 07/18/12 0725 07/18/12 0142 07/17/12 1933 07/17/12 1315  CKTOTAL -- -- -- --  CKMB -- -- -- --  CKMBINDEX -- -- -- --  TROPONINI <0.30 <0.30 <0.30 <0.30   BNP (last 3 results)  Basename 07/23/12 0445 07/17/12 1315  PROBNP 393.8 1312.0*   CBG:  Lab 07/23/12 0733 07/22/12 2052 07/22/12 1647 07/22/12 1303 07/22/12 0735  GLUCAP 163* 193* 219* 286* 218*    Recent Results (from the past 240 hour(s))  MRSA PCR SCREENING     Status: Normal   Collection Time  07/17/12  6:46 PM      Component Value Range Status Comment   MRSA by PCR NEGATIVE  NEGATIVE Final   URINE CULTURE     Status: Normal   Collection Time   07/17/12 11:07 PM      Component Value Range Status Comment   Specimen Description URINE, CLEAN CATCH   Final    Special Requests NONE   Final    Culture  Setup Time 07/18/2012 09:18   Final    Colony Count 75,000 COLONIES/ML   Final    Culture ENTEROCOCCUS SPECIES   Final    Report Status 07/21/2012 FINAL   Final    Organism ID, Bacteria ENTEROCOCCUS SPECIES   Final      Studies: Dg Chest Port 1 View  07/23/2012  *RADIOLOGY REPORT*  Clinical Data: COPD  PORTABLE CHEST - 1 VIEW  Comparison: 07/17/2012  Findings: Increased bibasilar heterogeneous opacities.  Mild cardiomegaly.  No pneumothorax.  Stable aorta. Stable right perihilar patchy density.  IMPRESSION: Increased bibasilar patchy airspace disease.   Original Report Authenticated By: Jolaine Click, M.D.     Scheduled Meds:   . acetylcysteine  2 mL Nebulization Q6H  . albuterol  2.5 mg Nebulization Q6H  . antiseptic oral rinse  15 mL Mouth Rinse  BID  . budesonide  0.25 mg Nebulization Q6H  . fluticasone  1 spray Each Nare Daily  . guaiFENesin  1,200 mg Oral BID  . insulin aspart  0-20 Units Subcutaneous TID WC  . insulin glargine  10 Units Subcutaneous QHS  . ipratropium  0.5 mg Nebulization Q6H  . levofloxacin  750 mg Oral Q48H  . mometasone-formoterol  2 puff Inhalation BID  . predniSONE  40 mg Oral Q breakfast  . sodium chloride  3 mL Intravenous Q12H  . Tamsulosin HCl  0.4 mg Oral QPC supper  . Warfarin - Pharmacist Dosing Inpatient   Does not apply q1800   Continuous Infusions:   Principal Problem:  *Acute respiratory failure Active Problems:  DVT (deep venous thrombosis)  COPD with exacerbation  Diabetes mellitus  HTN (hypertension)  Hyperlipidemia  CKD (chronic kidney disease) stage 3, GFR 30-59 ml/min  BPH (benign prostatic hypertrophy) with urinary obstruction  Calculus of kidney  PE, presumed  Bronchitis    Time spent: 35    [facility], Kamaria Lucia  [facility] Pager [phone]. If 8PM-8AM, please contact night-coverage at [email or web address], password [facility] 07/23/2012, 9:49 AM  LOS: 6 days

## 2012-07-23 NOTE — Progress Notes (Signed)
Took over pt's care at 1430 until 1900. Pt has no complaints at this time. I agree with this am's assessment. Will continue to monitor pt.   Arta Bruce [facility] 07/23/2012

## 2012-07-23 NOTE — Progress Notes (Signed)
ANTIBIOTIC CONSULT NOTE - INITIAL  Pharmacy Consult for Vancomycin/Zosyn Indication: Suspected PNA   No Known Allergies  Patient Measurements: Height: 5\' 9"  (175.3 cm) Weight: 218 lb 11.1 oz (99.2 kg) IBW/kg (Calculated) : 70.7  Adjusted Body Weight:   Vital Signs: Temp: 97.8 F (36.6 C) (02/05 0524) Temp src: Oral (02/05 0524) BP: 148/89 mmHg (02/05 0524) Pulse Rate: 89  (02/05 0524) Intake/Output from previous day: 02/04 0701 - 02/05 0700 In: 258 [P.O.:240; I.V.:18] Out: 200 [Urine:200] Intake/Output from this shift: Total I/O In: 240 [P.O.:240] Out: 150 [Urine:150]  Labs:  [facility] 07/23/12 0445 07/22/12 0342 07/21/12 0340  WBC 14.2* 13.5* 13.1*  HGB 13.9 14.4 14.8  PLT 190 188 163  LABCREA -- -- --  CREATININE 1.65* 1.70* 1.68*   Estimated Creatinine Clearance: 44.9 ml/min (by C-G formula based on Cr of 1.65). No results found for this basename: VANCOTROUGH:2,VANCOPEAK:2,VANCORANDOM:2,GENTTROUGH:2,GENTPEAK:2,GENTRANDOM:2,TOBRATROUGH:2,TOBRAPEAK:2,TOBRARND:2,AMIKACINPEAK:2,AMIKACINTROU:2,AMIKACIN:2, in the last 72 hours   Microbiology: Recent Results (from the past 720 hour(s))  MRSA PCR SCREENING     Status: Normal   Collection Time   07/17/12  6:46 PM      Component Value Range Status Comment   MRSA by PCR NEGATIVE  NEGATIVE Final   URINE CULTURE     Status: Normal   Collection Time   07/17/12 11:07 PM      Component Value Range Status Comment   Specimen Description URINE, CLEAN CATCH   Final    Special Requests NONE   Final    Culture  Setup Time 07/18/2012 09:18   Final    Colony Count 75,000 COLONIES/ML   Final    Culture ENTEROCOCCUS SPECIES   Final    Report Status 07/21/2012 FINAL   Final    Organism ID, Bacteria ENTEROCOCCUS SPECIES   Final     Medical History: Past Medical History  Diagnosis Date  . COPD (chronic obstructive pulmonary disease)   . Hypertension   . Diabetes mellitus without complication   . Anemia   . Diabetes mellitus  07/17/2012  . HTN (hypertension) 07/17/2012  . Hyperlipidemia 07/17/2012  . CKD (chronic kidney disease) stage 3, GFR 30-59 ml/min 07/17/2012  . BPH (benign prostatic hypertrophy) with urinary obstruction 07/17/2012  . Calculus of kidney 07/17/2012    Assessment: 76 yoM with acute respiratory failure 2/2 acute PE and probable CAP to switch from levaquin to Vancomycin and Zosyn for worsening patchy infiltrates on CXR.    Tm24h: 97.8  Renal: CKD-III, SCr 1.65, CrCl ~ 45 ml/min (N39)  WBC slow trend up (14.2)  Cultures: 1/30 UCx: 75K enterococcus, Urine legionella/pneumococcus negative.    4L [place], sats 93%  Weight 99.2kg   Goal of Therapy:  Vancomycin trough level 15-20 mcg/ml  Plan:  1.  Vancomycin loading dose: 2000mg  IV x 1 2.  Vancomycin maintenance dose: 1500mg  IV q 24h 3.  Zosyn 3.375g IV q8h (infuse over 4 hours) 4.  F/u renal fxn, WBC, T, cultures, clinical course, and vancomycin trough at steady state as appropriate  Riannah Stagner E 07/23/2012,10:03 AM

## 2012-07-23 NOTE — Progress Notes (Signed)
PT Cancellation Note  _X_Treatment cancelled today due to medical issues with patient which prohibited therapy....Marland Kitchensevere hypoxia  ___ Treatment cancelled today due to patient receiving procedure or test   ___ Treatment cancelled today due to patient's refusal to participate   ___ Treatment cancelled today due to  Felecia Shelling  PTA [facility]  Acute  Rehab Pager      [phone]

## 2012-07-23 NOTE — Progress Notes (Signed)
Patient voided 200 ml clear yellow urine without complaints of difficulty urinating. Post void residual checked using bladder scanner and revealed 198 ml. Will continue to monitor.

## 2012-07-23 NOTE — Progress Notes (Signed)
ANTICOAGULATION CONSULT NOTE - Follow Up Consult  Pharmacy Consult for Heparin, Warfarin Indication: DVT, PE  No Known Allergies  Patient Measurements: Height: 5\' 9"  (175.3 cm) Weight: 218 lb 11.1 oz (99.2 kg) IBW/kg (Calculated) : 70.7  Heparin Dosing Weight: 90 kg  Vital Signs: Temp: 97.8 F (36.6 C) (02/05 0524) Temp src: Oral (02/05 0524) BP: 148/89 mmHg (02/05 0524) Pulse Rate: 89  (02/05 0524)  Labs:  Basename 07/23/12 0445 07/22/12 0342 07/21/12 2001 07/21/12 1134 07/21/12 0340  HGB 13.9 14.4 -- -- --  HCT 42.3 43.7 -- -- 44.0  PLT 190 188 -- -- 163  APTT -- -- -- -- --  LABPROT 21.7* 24.2* -- -- 23.3*  INR 1.98* 2.29* -- -- 2.18*  HEPARINUNFRC -- 0.50 0.31 0.42 --  CREATININE 1.65* 1.70* -- -- 1.68*  CKTOTAL -- -- -- -- --  CKMB -- -- -- -- --  TROPONINI -- -- -- -- --    Estimated Creatinine Clearance: 44.9 ml/min (by C-G formula based on Cr of 1.65).  Assessment:  77 yom on coumadin for DVT/PE.  IV heparin stopped 2/4.   INR decreased slightly (1.98) -essentially still therapeutic today.  Will increase dose tonight as INR may fall further tomorrow.    CBC remains stable/wnl  No bleeding noted from RN  IV abx changed from LVQ to Vanc/Zosyn - no further DDI.    Goal of Therapy:  INR 2-3 Monitor platelets by anticoagulation protocol: Yes   Plan:   Wafarin 2mg  PO x1 today  Daily INR, CBC  Haynes Hoehn, PharmD 07/23/2012 11:02 AM  Pager: [phone]

## 2012-07-24 DIAGNOSIS — E119 Type 2 diabetes mellitus without complications: Secondary | ICD-10-CM | POA: Diagnosis not present

## 2012-07-24 DIAGNOSIS — J9600 Acute respiratory failure, unspecified whether with hypoxia or hypercapnia: Secondary | ICD-10-CM | POA: Diagnosis not present

## 2012-07-24 DIAGNOSIS — I82409 Acute embolism and thrombosis of unspecified deep veins of unspecified lower extremity: Secondary | ICD-10-CM | POA: Diagnosis not present

## 2012-07-24 LAB — GLUCOSE, CAPILLARY: Glucose-Capillary: 162 mg/dL — ABNORMAL HIGH (ref 70–99)

## 2012-07-24 LAB — CBC
HCT: 42.2 % (ref 39.0–52.0)
MCH: 30.9 pg (ref 26.0–34.0)
MCHC: 32 g/dL (ref 30.0–36.0)
MCV: 96.6 fL (ref 78.0–100.0)
RDW: 14.2 % (ref 11.5–15.5)

## 2012-07-24 LAB — PROTIME-INR: INR: 1.82 — ABNORMAL HIGH (ref 0.00–1.49)

## 2012-07-24 MED ORDER — ENOXAPARIN SODIUM 100 MG/ML ~~LOC~~ SOLN
1.0000 mg/kg | Freq: Two times a day (BID) | SUBCUTANEOUS | Status: DC
  Administered 2012-07-24 – 2012-07-25 (×4): 100 mg via SUBCUTANEOUS
  Filled 2012-07-24 (×6): qty 1

## 2012-07-24 MED ORDER — WARFARIN SODIUM 4 MG PO TABS
4.0000 mg | ORAL_TABLET | Freq: Once | ORAL | Status: AC
  Administered 2012-07-24: 4 mg via ORAL
  Filled 2012-07-24: qty 1

## 2012-07-24 NOTE — Progress Notes (Signed)
Pt's saturations dropped to 79% while attempting to stand to void; saturation back up when pt sat back down. Pt instructed on precautions of possible PE, and insist on standing up. Dr. Benjamine Mola aware and feels pt should not exert himself.

## 2012-07-24 NOTE — Progress Notes (Signed)
Physical Therapy Treatment Patient Details Name: Alexander Kirby MRN: [id number] DOB: 1934-08-25 Today's Date: 07/24/2012 Time: 4098-1191 PT Time Calculation (min): 14 min  PT Assessment / Plan / Recommendation Comments on Treatment Session  RN checked with MD and MD agreeable for PT to continue with therapy today.  Pt assisted to toilet and then back to recliner. Pt only wished to ambulate back to chair due to fatigue and SOB.    Follow Up Recommendations  SNF     Does the patient have the potential to tolerate intense rehabilitation     Barriers to Discharge        Equipment Recommendations  Rolling walker with 5" wheels    Recommendations for Other Services    Frequency     Plan Discharge plan remains appropriate;Frequency remains appropriate    Precautions / Restrictions Precautions Precautions: Fall Precaution Comments: lines, O2   Pertinent Vitals/Pain See below mobility section    Mobility  Bed Mobility Bed Mobility: Not assessed Transfers Transfers: Sit to Stand;Stand to Sit Sit to Stand: 3: Mod assist;From toilet;From chair/3-in-1;With upper extremity assist Stand to Sit: To chair/3-in-1;To toilet;4: Min guard;With upper extremity assist Details for Transfer Assistance: verbal cues for hand placement, mod assist from low toilet only and min/guard from chair Ambulation/Gait Ambulation/Gait Assistance: 4: Min assist Ambulation Distance (Feet): 8 Feet (x2) Assistive device: Rolling walker Ambulation/Gait Assistance Details: pt ambulated to/from toilet requiring min assist initially and then min/guard, pt reports increased SOB and remained on 4L O2  throughout session, with SaO2 85% upon return to recliner, verbal cues for safety with RW Gait Pattern: Step-through pattern;Trunk flexed;Decreased stride length Gait velocity: decreased    Exercises     PT Diagnosis:    PT Problem List:   PT Treatment Interventions:     PT Goals Acute Rehab PT Goals PT Goal: Sit  to Stand - Progress: Progressing toward goal PT Goal: Stand to Sit - Progress: Progressing toward goal PT Goal: Ambulate - Progress: Progressing toward goal  Visit Information  Last PT Received On: 07/24/12 Assistance Needed: +2    Subjective Data  Subjective: Listen, you all keep telling me to breathe through my nose but it's stopped up so I can't.   Cognition  Cognition Overall Cognitive Status: Appears within functional limits for tasks assessed/performed Area of Impairment: Safety/judgement Arousal/Alertness: Awake/alert Behavior During Session: Agitated Cognition - Other Comments: can became easily agitated as he prefers his way, increased cues for safety with lines    Balance     End of Session PT - End of Session Equipment Utilized During Treatment: Oxygen Activity Tolerance: Patient limited by fatigue Patient left: in chair;with call bell/phone within reach;with family/visitor present;with nursing in room   GP     Melanie Pellot,KATHrine E 07/24/2012, 3:19 PM Zenovia Jarred, PT, DPT 07/24/2012 Pager: [phone]

## 2012-07-24 NOTE — Progress Notes (Signed)
ANTICOAGULATION CONSULT NOTE - Initial Consult  Pharmacy Consult for Lovenox (plus Warfarin) Indication: PE  No Known Allergies  Patient Measurements: Height: 5\' 9"  (175.3 cm) Weight: 218 lb 11.1 oz (99.2 kg) IBW/kg (Calculated) : 70.7   Vital Signs: Temp: 97.8 F (36.6 C) (02/06 0546) Temp src: Oral (02/06 0546) BP: 156/76 mmHg (02/06 0546) Pulse Rate: 110  (02/06 0546)  Labs:  Basename 07/24/12 0507 07/23/12 0445 07/22/12 0342 07/21/12 2001 07/21/12 1134  HGB 13.5 13.9 -- -- --  HCT 42.2 42.3 43.7 -- --  PLT 191 190 188 -- --  APTT -- -- -- -- --  LABPROT 20.4* 21.7* 24.2* -- --  INR 1.82* 1.98* 2.29* -- --  HEPARINUNFRC -- -- 0.50 0.31 0.42  CREATININE -- 1.65* 1.70* -- --  CKTOTAL -- -- -- -- --  CKMB -- -- -- -- --  TROPONINI -- -- -- -- --    Estimated Creatinine Clearance: 44.9 ml/min (by C-G formula based on Cr of 1.65).   Medical History: Past Medical History  Diagnosis Date  . COPD (chronic obstructive pulmonary disease)   . Hypertension   . Diabetes mellitus without complication   . Anemia   . Diabetes mellitus 07/17/2012  . HTN (hypertension) 07/17/2012  . Hyperlipidemia 07/17/2012  . CKD (chronic kidney disease) stage 3, GFR 30-59 ml/min 07/17/2012  . BPH (benign prostatic hypertrophy) with urinary obstruction 07/17/2012  . Calculus of kidney 07/17/2012    Anticoag Meds:  1/30 >> IV heparin >> 2/4 1/31 >> Warfarin PO >> 2/6 >> Lovenox >>  Assessment: 78 yo M admitted on 07/17/12 with acute resp failure, started on IV heparin for LE DVT. IV heparin stopped on 2/4 after completion of 5 day overlap with warfarin and heparin. INR now slightly subtherapeutic (1.82); order to start Lovenox for subtherapeutic INR in setting of acute LLE DVT and probable PE (unable to complete CT due to renal fxn; hx CKD III). Wt= 99kg, CrCl = 45kg, will dose 1mg /kg q12h due to age 62 yo. Levaquin d/c'd 2/5,  Goal of Therapy:  Anti-Xa level 0.6-1.2 units/ml 4hrs after LMWH  dose given Monitor platelets by anticoagulation protocol: Yes INR 2-3   Plan:  1) Start Lovenox 100mg  SQ q12h 2) Warfarin 4mg  PO x1 at 18:00  Darrol Angel, PharmD Pager: [phone] 07/24/2012,9:09 AM

## 2012-07-24 NOTE — Care Management Note (Signed)
    Page 1 of 2   08/04/2012     4:06:24 PM   CARE MANAGEMENT NOTE 08/04/2012  Patient:  Alexander Kirby, Alexander Kirby   Account Number:  [id number]  Date Initiated:  07/18/2012  Documentation initiated by:  DAVIS,RHONDA  Subjective/Objective Assessment:   pt with confirmed dvt and poss PE , resp distress requiring venti mask and fi02 of 40%     Action/Plan:   home   Anticipated DC Date:  08/04/2012   Anticipated DC Plan:  SKILLED NURSING FACILITY      DC Planning Services  CM consult      Choice offered to / List presented to:  C-1 Patient           Status of service:  Completed, signed off Medicare Important Message given?   (If response is "NO", the following Medicare IM given date fields will be blank) Date Medicare IM given:   Date Additional Medicare IM given:    Discharge Disposition:  SKILLED NURSING FACILITY  Per UR Regulation:  Reviewed for med. necessity/level of care/duration of stay  If discussed at Long Length of Stay Meetings, dates discussed:   07/24/2012    Comments:  08/04/12 Sharni Negron RN,BSN NCM [phone] D/C SNF.  08/02/12 Soul Deveney RN,BSN NCM [phone] PER MD/PATIENT/SPOUSE SINCE IMPROVING,D/C PLAN SNF.CSW FOLLOWING. 08/01/12 Gaby Harney RN,BSN NCM [phone] PATIENT/SPOUSE STILL INTERESTED IN [facility],EXPLAINED THAT INSURANCE [employer] IS OON W/[facility],& OUT OF POCKET EXPENSE IS EXPECTED-THEY STILL WANT [facility].JENNY(REP)INFORMED PATIENT/SPOUSE OF THEIR PROCESS.NO BED AVAILABLE UNTIL MONDAY,NOT LIKELY OVERWEEKEND BUT WILL TRY.OFFERED AGAIN [facility]-THEY DO NOT WANT [facility].ALSO INFORMED PATIENT THAT HE MAY NOT QUALIFY FOR [facility] BY MONDAY,BUT WE WILL CONTINUE TO REVIEW.PREFER TO EVENTUALLY HOME,AS OPPOSED TO GOING TO SNF.SW UPDATED TO STILL FOLLOW IN CASE NO IMPROVEMENT IN STATUS.COBRA FORM ON CHART SIGNED BUT STILL WAITING FOR RECEIVING MD/BED.[facility] FOR TRANSP IF D/C TO [facility].MD UPDATED.  07/31/12 Philis Doke RN,BSN NCM [phone] SPOKE TO [facility]  REP FOLLOWING FOR [facility] SCREEN.WILL LOOK @ CASE TOMORROW.MD UPDATED.  07/30/12 Ilanna Deihl RN,BSN NCM [phone] PER MD SPOKE TO SPOUSE ABOUT LTAC,SHE IS IN AGREEMENT TO [facility],BUT DECLINES [facility].WILL AWAIT RESPONSE FROM [facility](REP) [facility].IF APPROPRIATE WILL NEED COBRA FORM,WILL TRANSFER VIA [facility] [phone] ONCE WE HAVE RM/BED/RECEIVING MD.NURSE UPDATED. 07/28/12 Alyjah Lovingood RN,BSN NCM [phone] CIR RECOMMENDED SNF.02 4L [place],IV LASIX,NEBS,IV ABX.  07/24/12 Kally Cadden RN,BSN NCM [phone] PATIENT/SPOUSE CHOSE [facility], DEBBIE(LIASON) AWARE OF REFERRAL,& WILL COME TO RM TO DISCUSS SERVICES FOR HOME CARE.I HAVE EXPLAINED TO PATIENT/SPOUSE THAT HOME CARE WILL MAKE HOME VISIT THE DAY AFTER D/C,THEY VOICED COMPREHENSION. PT/OT-HH.PROVIDED [facility] AGENCY LIST.IV ABX,02 4LNC.IF HOME 02 NEEDED CAN ARRANGE.  Real Cons, RN, BSN, CCM:  CHART REVIEWED AND UPDATED.  Next chart review due on [id number]. NO DISCHARGE NEEDS PRESENT AT THIS TIME. CASE MANAGEMENT [phone]

## 2012-07-24 NOTE — Progress Notes (Signed)
Call to Dr. Benjamine Mola as pt is c/o of severe pain in his lower abd, and he states he is unsure of what is causing the pain. Bladder scan reveals <139ml of urine. He has condom cath intact draining yellow urine. Pt assisted back to bed, pain medication and xanax given.

## 2012-07-24 NOTE — Progress Notes (Signed)
Pt cbg >300, spoke to pt as he has been eating high calorie, high sugar snacks. He states "I'm going to eat what I want to".

## 2012-07-24 NOTE — Progress Notes (Signed)
Inpatient Diabetes Program Recommendations  AACE/ADA: New Consensus Statement on Inpatient Glycemic Control (2013)  Target Ranges:  Prepandial:   less than 140 mg/dL      Peak postprandial:   less than 180 mg/dL (1-2 hours)      Critically ill patients:  140 - 180 mg/dL   Reason for Visit: Elevated HgbA1C and Hyperglycemia  Pt states he checks his blood sugar 1x/day in am.  "I used to check it twice/day and I didn't like what I saw." Takes Amaryl 1 mg for DM.   HgbA1C - 8.4%  Pt eating well.    Continue with Lantus 15 units QHS and Novolog resistant tidwc and hs. Pt denies any questions regarding diabetes management.  Results for OJAS, COONE (MRN [id number]) as of 07/24/2012 16:03  Ref. Range 07/23/2012 11:38 07/23/2012 17:21 07/23/2012 22:20 07/24/2012 08:04 07/24/2012 11:45  Glucose-Capillary Latest Range: 70-99 mg/dL 782 (H) 956 (H) 213 (H) 162 (H) 162 (H)  Results for KITO, CUFFE (MRN [id number]) as of 07/24/2012 16:03  Ref. Range 07/17/2012 17:34  Hemoglobin A1C Latest Range: <5.7 % 8.4 (H)       Note: Will continue to follow.  Thanks. Ailene Ards, RD, LDN, CDE Inpatient Diabetes Coordinator [phone]

## 2012-07-24 NOTE — Progress Notes (Signed)
NUTRITION FOLLOW UP:  Late Entry for (07/23/12 1630)  Intervention:   1.  Brief education; provided.  Discussed therapeutic diet with pt and family.  Encouraged meal spacing and adequate intake.  Recommended sugar-free beverages for pt.   Handout saved for pt in his discharge paperwork per his request.  2.  General healthful diet; continue to encourage intake as needed.  Pt currently eating well.  Nutrition Dx:   Inadequate oral intake, ongoing  Monitor:   1.  Food/Beverage; improvement in intake, pt eating per his usual.  Met. 2.  Blood glucose; goal 80-150 mg/dL.  Ongoing. Pt with improvement, but ongoing hyperglycemia.  Assessment:   Pt admitted with shortness of breath.  Pt with h/o COPD.  Pt and wife state adequate intake.  Pt is eating 3 meals daily.  Discussed current diet order with pt and wife, and benefits of following a consistent carbohydrate diet.  Encouraged sugar-free beverages for pt and glucose monitoring at home.  Pt is agreeable to having reading materials at home- information left in discharge paperwork per his request.  Height: Ht Readings from Last 1 Encounters:  07/17/12 5\' 9"  (1.753 m)    Weight Status:   Wt Readings from Last 1 Encounters:  07/22/12 218 lb 11.1 oz (99.2 kg)    Re-estimated needs:  Kcal: 1880-2060 Protein: 75-94g Fluid: >2.0 L/day  Skin: intact  Diet Order: Carb Control   Intake/Output Summary (Last 24 hours) at 07/24/12 0857 Last data filed at 07/24/12 0800  Gross per 24 hour  Intake   1060 ml  Output    880 ml  Net    180 ml    Last BM: 2/4   Labs:   Lab 07/23/12 0445 07/22/12 0342 07/21/12 0340 07/17/12 1933  NA 132* 132* 132* --  K 4.8 4.5 4.8 --  CL 99 99 99 --  CO2 27 24 23  --  BUN 48* 51* 47* --  CREATININE 1.65* 1.70* 1.68* --  CALCIUM 8.9 8.8 8.7 --  MG -- -- -- 1.8  PHOS -- -- -- --  GLUCOSE 211* 232* 178* --    CBG (last 3)   Basename 07/24/12 0804 07/23/12 2220 07/23/12 1721  GLUCAP 162* 173* 403*     Scheduled Meds:   . albuterol  2.5 mg Nebulization Q6H  . antiseptic oral rinse  15 mL Mouth Rinse BID  . budesonide  0.25 mg Nebulization Q6H  . enalapril  10 mg Oral Daily   And  . hydrochlorothiazide  25 mg Oral Daily  . fluticasone  1 spray Each Nare Daily  . guaiFENesin  1,200 mg Oral BID  . insulin aspart  0-20 Units Subcutaneous TID WC  . insulin glargine  15 Units Subcutaneous QHS  . ipratropium  0.5 mg Nebulization Q6H  . mometasone-formoterol  2 puff Inhalation BID  . piperacillin-tazobactam (ZOSYN)  IV  3.375 g Intravenous Q8H  . predniSONE  40 mg Oral Q breakfast  . sodium chloride  3 mL Intravenous Q12H  . Tamsulosin HCl  0.4 mg Oral QPC supper  . vancomycin  1,500 mg Intravenous Q24H  . Warfarin - Pharmacist Dosing Inpatient   Does not apply q1800    Continuous Infusions:   Loyce Dys, MS RD LDN Clinical Inpatient Dietitian Pager: [phone] Weekend/After hours pager: [phone]

## 2012-07-24 NOTE — Progress Notes (Signed)
[facility] PROGRESS NOTE  Alexander Kirby WNU:[id number] DOB: 1935/03/15 DOA: 07/17/2012 PCP: Ailene Ravel, MD  Assessment/Plan: #1 acute respiratory failure  Likely multifactorial in nature secondary to probable acute PE, probable community-acquired pneumonia per patchy infiltrate seen on chest x-ray with symptoms of a productive cough that have been ongoing for several weeks- chest x ray shows worsening patchy infiltrate but BNP decreased- will broaden abx to vanc/zosyn and watch for improvment, in the setting of COPD. Patient looks less labored today. Patient currently 4 L nasal cannula  Sputum Gram stain and culture pending. Urine Legionella antigen negative. Urine pneumococcus antigen negative.  Continue oxygen, continue nebs. PO steroids. Chest PT. Unable to get CT angiogram secondary to chronic kidney disease. Unable to get VQ scan as patient can't lay flat. Follow. We'll also place on Ativan as needed.  Patient is tenous with high risk for intubation. Pulm signed off   #2 hypoxia  Likely multifactorial secondary to probable PE in a patient with left lower extremity DVT per Doppler ultrasound in the setting of probable community-acquired pneumonia per chest x-ray findings and symptoms. Patient was tachycardic on presentation which is improved. Sputum Gram stain and cultures pending. Urine Legionella antigen negative. Urine pneumococcus antigen negative. See above   #3 left lower extremity DVT  Patient was started on IV heparin in the ED and a such a hypercoagulable panel was unable to be obtained. Coumadin- INR (lovenox)   #4 probable PE  Patient had presented with severe hypoxia with sats in 80% requiring a face mask. Patient was tachycardic. Lower extremity Dopplers were positive for left lower extremity DVT. Unable to get CT angiogram secondary to renal function. Heparin was started in the emergency room and a such unable to obtain a hypercoagulable panel. Unable to get VQ scan as  patient could not lay flat. 2-D echo with EF of 65-70%. Right ventricle was mildly to moderately dilated. Intraventricular septum was D-shaped with evidence for right ventricular pressure/volume overload which could be consistent with a large PE.    #5 COPD exac  On presentation patient did have some wheezing. Wheezing improved but poor to fair air movement and some use of accessory muscles of respiration. PO steroids  Chest PT. Flutter valve. Patient is on mucolytics.   #6 probable community-acquired pneumoniavs bronchitis  Patient had presented with several weeks of productive cough. Chest x-ray with patchy infiltrates. Patient also did have a leukocytosis on admission. Sputum Gram stain and cultures pending. Urine Legionella antigen negative. Urine pneumococcus antigen negative. Leukocytosis is fluctuating in part also due to steriods. Broaden abx, oxygen, nebs, Robitussin as needed.   #7 acute on chronic kidney disease stage III  Her PCPs records patient's last creatinine was 1.79 on 09/28/2011. On admission creatinine was 1.99. Renal ultrasound with slight thinning of the renal cortices. No acute abnormalities. ACE inhibitor and diuretics are on hold. Renal function has improved and close to baseline today at 1.70. Follow.   #8 Hx of anemia  H&H is stable.   #9 leukocytosis  Likely reactive secondary to probable PE and probable community-acquired pneumonia in setting of steriods. Sputum Gram stain and culture pending. Urinalysis is unremarkable. Urine culture with 75,000 of enterococcus species sensitive to Levaquin. Chest x-ray with patchy infiltrates. Urine Legionella antigen negative. Urine pneumococcus antigen negative. Leukocytosis is fluctuating in part also due to steriods.   #10 diabetes mellitus  Hemoglobin A1c is 8.4. CBGs have ranged from 207-269. Increase Lantus to 10 units daily. Continue sliding scale insulin.   #  11 hypertension  Stable.  Follow.  Resume home meds   #12  prophylaxis  PPI for GI prophylaxis. On full dose heparin for DVT.    Code Status: Full  Family Communication: Updated patient. No family at bedtime.  Disposition Plan: Home vs SNF when medically stable    Consultants:  PC CM: Dr Sung Amabile 07/21/2012- signed off 2/4   Procedures:  Chest x-ray 07/17/2012  Lower extremity Dopplers 07/17/2012  2-D echo 07/18/2012  Renal ultrasound 07/18/2012   Antibiotics:  IV Levaquin 07/17/2012--->07/19/12  Oral levaquin 07/19/12   HPI/Subjective: Patient feeling better today Anxious about breathing   Objective: Filed Vitals:   07/24/12 0240 07/24/12 0546 07/24/12 0822 07/24/12 0907  BP:  156/76    Pulse:  110    Temp:  97.8 F (36.6 C)    TempSrc:  Oral    Resp:  20    Height:      Weight:      SpO2: 96% 96% 96% 92%    Intake/Output Summary (Last 24 hours) at 07/24/12 1250 Last data filed at 07/24/12 1043  Gross per 24 hour  Intake   1300 ml  Output    880 ml  Net    420 ml   Filed Weights   07/19/12 0400 07/20/12 0410 07/22/12 0400  Weight: 95.6 kg (210 lb 12.2 oz) 96.8 kg (213 lb 6.5 oz) 99.2 kg (218 lb 11.1 oz)    Exam:   General:  A+Ox3, NAD  Cardiovascular: rrr  Respiratory: dec BS b/l, no wheezing  Abdomen: +BS, soft, NT  Data Reviewed: Basic Metabolic Panel:  Lab 07/23/12 1610 07/22/12 0342 07/21/12 0340 07/20/12 0346 07/19/12 0340 07/17/12 1933  NA 132* 132* 132* 135 136 --  K 4.8 4.5 4.8 4.4 4.2 --  CL 99 99 99 101 102 --  CO2 27 24 23 24 25  --  GLUCOSE 211* 232* 178* 143* 144* --  BUN 48* 51* 47* 46* 44* --  CREATININE 1.65* 1.70* 1.68* 1.75* 1.83* --  CALCIUM 8.9 8.8 8.7 8.8 8.9 --  MG -- -- -- -- -- 1.8  PHOS -- -- -- -- -- --   Liver Function Tests:  Lab 07/17/12 1315  AST 18  ALT 21  ALKPHOS 116  BILITOT 1.1  PROT 6.4  ALBUMIN 2.7*   No results found for this basename: LIPASE:5,AMYLASE:5 in the last 168 hours No results found for this basename: AMMONIA:5 in the last 168  hours CBC:  Lab 07/24/12 0507 07/23/12 0445 07/22/12 0342 07/21/12 0340 07/20/12 0346 07/17/12 1315  WBC 14.2* 14.2* 13.5* 13.1* 15.4* --  NEUTROABS -- -- -- -- -- 17.2*  HGB 13.5 13.9 14.4 14.8 14.9 --  HCT 42.2 42.3 43.7 44.0 45.7 --  MCV 96.6 94.6 94.6 94.4 95.2 --  PLT 191 190 188 163 177 --   Cardiac Enzymes:  Lab 07/18/12 0725 07/18/12 0142 07/17/12 1933 07/17/12 1315  CKTOTAL -- -- -- --  CKMB -- -- -- --  CKMBINDEX -- -- -- --  TROPONINI <0.30 <0.30 <0.30 <0.30   BNP (last 3 results)  Basename 07/23/12 0445 07/17/12 1315  PROBNP 393.8 1312.0*   CBG:  Lab 07/24/12 1145 07/24/12 0804 07/23/12 2220 07/23/12 1721 07/23/12 1138  GLUCAP 162* 162* 173* 403* 165*    Recent Results (from the past 240 hour(s))  MRSA PCR SCREENING     Status: Normal   Collection Time   07/17/12  6:46 PM      Component Value Range  Status Comment   MRSA by PCR NEGATIVE  NEGATIVE Final   URINE CULTURE     Status: Normal   Collection Time   07/17/12 11:07 PM      Component Value Range Status Comment   Specimen Description URINE, CLEAN CATCH   Final    Special Requests NONE   Final    Culture  Setup Time 07/18/2012 09:18   Final    Colony Count 75,000 COLONIES/ML   Final    Culture ENTEROCOCCUS SPECIES   Final    Report Status 07/21/2012 FINAL   Final    Organism ID, Bacteria ENTEROCOCCUS SPECIES   Final      Studies: Dg Chest Port 1 View  07/23/2012  *RADIOLOGY REPORT*  Clinical Data: COPD  PORTABLE CHEST - 1 VIEW  Comparison: 07/17/2012  Findings: Increased bibasilar heterogeneous opacities.  Mild cardiomegaly.  No pneumothorax.  Stable aorta. Stable right perihilar patchy density.  IMPRESSION: Increased bibasilar patchy airspace disease.   Original Report Authenticated By: Jolaine Click, M.D.     Scheduled Meds:    . albuterol  2.5 mg Nebulization Q6H  . antiseptic oral rinse  15 mL Mouth Rinse BID  . budesonide  0.25 mg Nebulization Q6H  . enalapril  10 mg Oral Daily   And  .  hydrochlorothiazide  25 mg Oral Daily  . enoxaparin (LOVENOX) injection  1 mg/kg Subcutaneous BID  . fluticasone  1 spray Each Nare Daily  . guaiFENesin  1,200 mg Oral BID  . insulin aspart  0-20 Units Subcutaneous TID WC  . insulin glargine  15 Units Subcutaneous QHS  . ipratropium  0.5 mg Nebulization Q6H  . mometasone-formoterol  2 puff Inhalation BID  . piperacillin-tazobactam (ZOSYN)  IV  3.375 g Intravenous Q8H  . predniSONE  40 mg Oral Q breakfast  . sodium chloride  3 mL Intravenous Q12H  . Tamsulosin HCl  0.4 mg Oral QPC supper  . vancomycin  1,500 mg Intravenous Q24H  . warfarin  4 mg Oral ONCE-1800  . Warfarin - Pharmacist Dosing Inpatient   Does not apply q1800   Continuous Infusions:   Principal Problem:  *Acute respiratory failure Active Problems:  DVT (deep venous thrombosis)  COPD with exacerbation  Diabetes mellitus  HTN (hypertension)  Hyperlipidemia  CKD (chronic kidney disease) stage 3, GFR 30-59 ml/min  BPH (benign prostatic hypertrophy) with urinary obstruction  Calculus of kidney  PE, presumed  Bronchitis    Time spent: 35    [facility], Jaicee Michelotti  [facility] Pager [phone]. If 8PM-8AM, please contact night-coverage at [email or web address], password [facility] 07/24/2012, 12:50 PM  LOS: 7 days

## 2012-07-25 DIAGNOSIS — N179 Acute kidney failure, unspecified: Secondary | ICD-10-CM | POA: Diagnosis not present

## 2012-07-25 DIAGNOSIS — N401 Enlarged prostate with lower urinary tract symptoms: Secondary | ICD-10-CM

## 2012-07-25 DIAGNOSIS — I82409 Acute embolism and thrombosis of unspecified deep veins of unspecified lower extremity: Secondary | ICD-10-CM | POA: Diagnosis not present

## 2012-07-25 DIAGNOSIS — R339 Retention of urine, unspecified: Secondary | ICD-10-CM | POA: Diagnosis present

## 2012-07-25 DIAGNOSIS — N189 Chronic kidney disease, unspecified: Secondary | ICD-10-CM | POA: Diagnosis not present

## 2012-07-25 DIAGNOSIS — N139 Obstructive and reflux uropathy, unspecified: Secondary | ICD-10-CM

## 2012-07-25 LAB — CBC
Hemoglobin: 13.9 g/dL (ref 13.0–17.0)
MCH: 31 pg (ref 26.0–34.0)
MCHC: 32 g/dL (ref 30.0–36.0)
MCV: 97.1 fL (ref 78.0–100.0)
Platelets: 200 10*3/uL (ref 150–400)
RBC: 4.48 MIL/uL (ref 4.22–5.81)

## 2012-07-25 LAB — PROTIME-INR: Prothrombin Time: 23.5 seconds — ABNORMAL HIGH (ref 11.6–15.2)

## 2012-07-25 LAB — GLUCOSE, CAPILLARY
Glucose-Capillary: 181 mg/dL — ABNORMAL HIGH (ref 70–99)
Glucose-Capillary: 242 mg/dL — ABNORMAL HIGH (ref 70–99)
Glucose-Capillary: 250 mg/dL — ABNORMAL HIGH (ref 70–99)
Glucose-Capillary: 347 mg/dL — ABNORMAL HIGH (ref 70–99)

## 2012-07-25 MED ORDER — WARFARIN SODIUM 4 MG PO TABS
4.0000 mg | ORAL_TABLET | Freq: Once | ORAL | Status: AC
  Administered 2012-07-25: 4 mg via ORAL
  Filled 2012-07-25: qty 1

## 2012-07-25 MED ORDER — FUROSEMIDE 10 MG/ML IJ SOLN
40.0000 mg | Freq: Once | INTRAMUSCULAR | Status: AC
  Administered 2012-07-25: 40 mg via INTRAVENOUS
  Filled 2012-07-25: qty 4

## 2012-07-25 NOTE — Progress Notes (Signed)
Occupational Therapy Treatment Patient Details Name: Alexander Kirby MRN: [id number] DOB: 1934-08-02 Today's Date: 07/25/2012 Time: 4098-1191 OT Time Calculation (min): 43 min  OT Assessment / Plan / Recommendation Comments on Treatment Session Pt progressing slowly with activity.  Pt was willing to do a little more with a lot of rest breaks.  He is focuses on his sats and needs encouragement for pursed lip breathing.      Follow Up Recommendations  SNF (if agreeable due to slow progress, if not HHOT)    Barriers to Discharge       Equipment Recommendations  None recommended by OT    Recommendations for Other Services    Frequency Min 2X/week   Plan Frequency needs to be updated    Precautions / Restrictions Precautions Precautions: Fall Precaution Comments: lines, O2 Restrictions Weight Bearing Restrictions: No   Pertinent Vitals/Pain sats 85 - 93% on 4 liters,  HR 81 to 105 (after coughing spell)    ADL  Grooming: Performed;Maximal assistance;Wash/dry hands (asked wife to help him) Where Assessed - Grooming: Supported standing Toilet Transfer: Counsellor Method: Sit to Barista:  (chair, sink, chair) Transfers/Ambulation Related to ADLs: Pt continues to be anxious.  Educated on Pursed lip breathing:  02 sats dropped to 85 after coughing spell and 88% after standing at sink.  Returned to 93% on 4 liters 02.  Pt is HOH and was initially resistant to PLB but then tried a couple of times.  Pt needs extended rest breaks and must do things on his own time.   ADL Comments: Focusing on balancing activity and rest for energy conservation as pt needs encouragement to build activity tolerance.  He was willing to step towards door after resting.  Min guard for safety.  Pt asks his wife to do things--will continue to encourage him to do what he can for himself.    OT Diagnosis:    OT Problem List:   OT Treatment Interventions:     OT  Goals ADL Goals Pt Will Perform Grooming: with supervision;Standing at sink ADL Goal: Grooming - Progress: Progressing toward goals (slowly) Pt Will Transfer to Toilet: with supervision;Ambulation;3-in-1 ADL Goal: Toilet Transfer - Progress: Progressing toward goals (slowly) Miscellaneous OT Goals Miscellaneous OT Goal #2: Pt will demonstrate pursed lip breathing when dyspnea present without cues OT Goal: Miscellaneous Goal #2 - Progress: Progressing toward goals  Visit Information  Last OT Received On: 07/25/12 Assistance Needed: +1 (walking to sink with extended 02 cord)    Subjective Data      Prior Functioning       Cognition  Cognition Overall Cognitive Status: Appears within functional limits for tasks assessed/performed Area of Impairment: Safety/judgement Arousal/Alertness: Awake/alert Behavior During Session: Anxious    Mobility  Transfers Sit to Stand: 4: Min guard;With upper extremity assist;With armrests;From chair/3-in-1 Details for Transfer Assistance: vcs for hand placement    Exercises      Balance     End of Session OT - End of Session Activity Tolerance: Patient limited by fatigue Patient left: in chair;with call bell/phone within reach  GO     [facility] 07/25/2012, 4:27 PM Marica Otter, OTR/L [phone] 07/25/2012

## 2012-07-25 NOTE — Progress Notes (Signed)
Notified Dr. Benjamine Mola that patient has continued to have blood in his urine. Will continue to monitor. Erskin Burnet RN

## 2012-07-25 NOTE — Progress Notes (Signed)
Inserted foley catheter. Noted blood in urine after insertion most likely related to aggravation of his inflamed prostate insertion. Notified Dr.Vann of blood in urine. Will continue to monitor. Erskin Burnet RN

## 2012-07-25 NOTE — Progress Notes (Signed)
ANTICOAGULATION CONSULT NOTE - Follow Up  Pharmacy Consult for Lovenox and Warfarin Indication: LLE DVT and Probable PE  No Known Allergies  Patient Measurements: Height: 5\' 9"  (175.3 cm) Weight: 218 lb 11.1 oz (99.2 kg) IBW/kg (Calculated) : 70.7   Labs:  Basename 07/25/12 0420 07/24/12 0507 07/23/12 0445  HGB 13.9 13.5 --  HCT 43.5 42.2 42.3  PLT 200 191 190  APTT -- -- --  LABPROT 23.5* 20.4* 21.7*  INR 2.20* 1.82* 1.98*  HEPARINUNFRC -- -- --  CREATININE -- -- 1.65*  CKTOTAL -- -- --  CKMB -- -- --  TROPONINI -- -- --    Estimated Creatinine Clearance: 44.9 ml/min (by C-G formula based on Cr of 1.65).  Anticoag Meds:  1/30 >> IV heparin >> 2/4 1/31 >> Warfarin PO >> 2/6 >> Lovenox >>  Assessment: 78 yo M admitted on 07/17/12 with acute resp failure, started on IV heparin for LE DVT. IV heparin stopped on 2/4 after completion of 5 day overlap with warfarin and heparin. Lovenox started yesterday for subtherapeutic INR in setting of acute LLE DVT and probable PE (unable to complete CT due to renal fxn; hx CKD III).  Levaquin d/c'd 2/5. Today's INR is back in goal range 2-3 today. No bleeding reported in chart notes. H/H, plts wnl.  Goal of Therapy:  Anti-Xa level 0.6-1.2 units/ml 4hrs after LMWH dose given Monitor platelets by anticoagulation protocol: Yes INR 2-3   Plan:  1) Suggest d/c'ing Lovenox 100mg  SQ q12h now that INR >2 (MD must write order) 2) Repeat Warfarin 4mg  PO x1 at 18:00  Darrol Angel, PharmD Pager: [phone] 07/25/2012,8:34 AM

## 2012-07-25 NOTE — Progress Notes (Signed)
[facility] PROGRESS NOTE  Alexander Kirby ZOX:[id number] DOB: 16-Johanna-1936 DOA: 07/17/2012 PCP: Ailene Ravel, MD  Assessment/Plan: #1 acute respiratory failure  Likely multifactorial in nature secondary to probable acute PE, probable community-acquired pneumonia per patchy infiltrate seen on chest x-ray with symptoms of a productive cough that have been ongoing for several weeks- chest x ray shows worsening patchy infiltrate but BNP decreased- will broaden abx to vanc/zosyn and watch for improvment, in the setting of COPD. Patient looks less labored today. Patient currently 4 L nasal cannula  Sputum Gram stain and culture pending. Urine Legionella antigen negative. Urine pneumococcus antigen negative.  Continue oxygen, continue nebs. PO steroids. Chest PT. Unable to get CT angiogram secondary to chronic kidney disease. Unable to get VQ scan as patient can't lay flat. Follow. We'll also place on Ativan as needed.  Patient is tenous with high risk for intubation. Pulm signed off   #2 hypoxia  Likely multifactorial secondary to probable PE in a patient with left lower extremity DVT per Doppler ultrasound in the setting of probable community-acquired pneumonia per chest x-ray findings and symptoms. Patient was tachycardic on presentation which is improved. Sputum Gram stain and cultures pending. Urine Legionella antigen negative. Urine pneumococcus antigen negative. See above   #3 left lower extremity DVT  Patient was started on IV heparin in the ED and a such a hypercoagulable panel was unable to be obtained. Coumadin- INR (lovenox)   #4 probable PE  Patient had presented with severe hypoxia with sats in 80% requiring a face mask. Patient was tachycardic. Lower extremity Dopplers were positive for left lower extremity DVT. Unable to get CT angiogram secondary to renal function. Heparin was started in the emergency room and a such unable to obtain a hypercoagulable panel. Unable to get VQ scan as  patient could not lay flat. 2-D echo with EF of 65-70%. Right ventricle was mildly to moderately dilated. Intraventricular septum was D-shaped with evidence for right ventricular pressure/volume overload which could be consistent with a large PE.    #5 COPD exac  On presentation patient did have some wheezing. Wheezing improved but poor to fair air movement and some use of accessory muscles of respiration. PO steroids  Chest PT. Flutter valve. Patient is on mucolytics.   #6 probable community-acquired pneumoniavs bronchitis  Patient had presented with several weeks of productive cough. Chest x-ray with patchy infiltrates. Patient also did have a leukocytosis on admission. Sputum Gram stain and cultures pending. Urine Legionella antigen negative. Urine pneumococcus antigen negative. Leukocytosis is fluctuating in part also due to steriods. Broaden abx, oxygen, nebs, Robitussin as needed.   #7 acute on chronic kidney disease stage III  Her PCPs records patient's last creatinine was 1.79 on 09/28/2011. On admission creatinine was 1.99. Renal ultrasound with slight thinning of the renal cortices. No acute abnormalities. ACE inhibitor and diuretics are on hold. Renal function has improved and close to baseline today at 1.70. Follow.   #8 Hx of anemia  H&H is stable.   #9 leukocytosis  Likely reactive secondary to probable PE and probable community-acquired pneumonia in setting of steriods. Sputum Gram stain and culture pending. Urinalysis is unremarkable. Urine culture with 75,000 of enterococcus species sensitive to Levaquin. Chest x-ray with patchy infiltrates. Urine Legionella antigen negative. Urine pneumococcus antigen negative. Leukocytosis is fluctuating in part also due to steriods.   #10 diabetes mellitus  Hemoglobin A1c is 8.4. CBGs have ranged from 207-269. Increase Lantus to 10 units daily. Continue sliding scale insulin-  not compliant at home  #11 hypertension  Stable.  Follow.  Resume  home meds   #12 prophylaxis  PPI for GI prophylaxis. On full dose heparin for DVT.    #13 urinary retention -foley placed -> 1 L out so far   Code Status: Full  Family Communication: Updated patient. No family at bedtime.  Disposition Plan: Home    Consultants:  PC CM: Dr Sung Amabile 07/21/2012- signed off 2/4   Procedures:  Chest x-ray 07/17/2012  Lower extremity Dopplers 07/17/2012  2-D echo 07/18/2012  Renal ultrasound 07/18/2012   Antibiotics:  IV Levaquin 07/17/2012--->07/19/12  Broad spectrum abx   HPI/Subjective: C/o condom catheter leaking Still SOB   Objective: Filed Vitals:   07/24/12 2053 07/25/12 0212 07/25/12 0514 07/25/12 0857  BP: 118/61  124/91   Pulse: 101  84   Temp: 98.2 F (36.8 C)  98.1 F (36.7 C)   TempSrc: Oral  Oral   Resp: 19  18   Height:      Weight:      SpO2: 93% 92% 94% 97%    Intake/Output Summary (Last 24 hours) at 07/25/12 1115 Last data filed at 07/25/12 1100  Gross per 24 hour  Intake   1050 ml  Output   1900 ml  Net   -850 ml   Filed Weights   07/19/12 0400 07/20/12 0410 07/22/12 0400  Weight: 95.6 kg (210 lb 12.2 oz) 96.8 kg (213 lb 6.5 oz) 99.2 kg (218 lb 11.1 oz)    Exam:   General:  A+Ox3, NAD  Cardiovascular: rrr  Respiratory: dec BS b/l, no wheezing  Abdomen: +BS, soft, NT  Data Reviewed: Basic Metabolic Panel:  Lab 07/23/12 1610 07/22/12 0342 07/21/12 0340 07/20/12 0346 07/19/12 0340  NA 132* 132* 132* 135 136  K 4.8 4.5 4.8 4.4 4.2  CL 99 99 99 101 102  CO2 27 24 23 24 25   GLUCOSE 211* 232* 178* 143* 144*  BUN 48* 51* 47* 46* 44*  CREATININE 1.65* 1.70* 1.68* 1.75* 1.83*  CALCIUM 8.9 8.8 8.7 8.8 8.9  MG -- -- -- -- --  PHOS -- -- -- -- --   Liver Function Tests: No results found for this basename: AST:5,ALT:5,ALKPHOS:5,BILITOT:5,PROT:5,ALBUMIN:5 in the last 168 hours No results found for this basename: LIPASE:5,AMYLASE:5 in the last 168 hours No results found for this basename:  AMMONIA:5 in the last 168 hours CBC:  Lab 07/25/12 0420 07/24/12 0507 07/23/12 0445 07/22/12 0342 07/21/12 0340  WBC 13.9* 14.2* 14.2* 13.5* 13.1*  NEUTROABS -- -- -- -- --  HGB 13.9 13.5 13.9 14.4 14.8  HCT 43.5 42.2 42.3 43.7 44.0  MCV 97.1 96.6 94.6 94.6 94.4  PLT 200 191 190 188 163   Cardiac Enzymes: No results found for this basename: CKTOTAL:5,CKMB:5,CKMBINDEX:5,TROPONINI:5 in the last 168 hours BNP (last 3 results)  Basename 07/23/12 0445 07/17/12 1315  PROBNP 393.8 1312.0*   CBG:  Lab 07/25/12 0742 07/24/12 2051 07/24/12 1652 07/24/12 1145 07/24/12 0804  GLUCAP 181* 244* 334* 162* 162*    Recent Results (from the past 240 hour(s))  MRSA PCR SCREENING     Status: Normal   Collection Time   07/17/12  6:46 PM      Component Value Range Status Comment   MRSA by PCR NEGATIVE  NEGATIVE Final   URINE CULTURE     Status: Normal   Collection Time   07/17/12 11:07 PM      Component Value Range Status Comment   Specimen  Description URINE, CLEAN CATCH   Final    Special Requests NONE   Final    Culture  Setup Time 07/18/2012 09:18   Final    Colony Count 75,000 COLONIES/ML   Final    Culture ENTEROCOCCUS SPECIES   Final    Report Status 07/21/2012 FINAL   Final    Organism ID, Bacteria ENTEROCOCCUS SPECIES   Final      Studies: No results found.  Scheduled Meds:    . albuterol  2.5 mg Nebulization Q6H  . antiseptic oral rinse  15 mL Mouth Rinse BID  . budesonide  0.25 mg Nebulization Q6H  . enalapril  10 mg Oral Daily   And  . hydrochlorothiazide  25 mg Oral Daily  . enoxaparin (LOVENOX) injection  1 mg/kg Subcutaneous BID  . fluticasone  1 spray Each Nare Daily  . guaiFENesin  1,200 mg Oral BID  . insulin aspart  0-20 Units Subcutaneous TID WC  . insulin glargine  15 Units Subcutaneous QHS  . ipratropium  0.5 mg Nebulization Q6H  . mometasone-formoterol  2 puff Inhalation BID  . piperacillin-tazobactam (ZOSYN)  IV  3.375 g Intravenous Q8H  . predniSONE  40  mg Oral Q breakfast  . sodium chloride  3 mL Intravenous Q12H  . Tamsulosin HCl  0.4 mg Oral QPC supper  . vancomycin  1,500 mg Intravenous Q24H  . warfarin  4 mg Oral ONCE-1800  . Warfarin - Pharmacist Dosing Inpatient   Does not apply q1800   Continuous Infusions:   Principal Problem:  *Acute respiratory failure Active Problems:  DVT (deep venous thrombosis)  COPD with exacerbation  Diabetes mellitus  HTN (hypertension)  Hyperlipidemia  CKD (chronic kidney disease) stage 3, GFR 30-59 ml/min  BPH (benign prostatic hypertrophy) with urinary obstruction  Calculus of kidney  PE, presumed  Bronchitis  Urinary retention with incomplete bladder emptying    Time spent: 35    [facility], Shalunda Lindh  [facility] Pager [phone]. If 8PM-8AM, please contact night-coverage at [email or web address], password [facility] 07/25/2012, 11:15 AM  LOS: 8 days

## 2012-07-25 NOTE — Progress Notes (Signed)
ANTIBIOTIC CONSULT NOTE - FOLLOW UP  Pharmacy Consult for Vanco and Zosyn Indication: PNA  No Known Allergies  Patient Measurements: Height: 5\' 9"  (175.3 cm) Weight: 218 lb 11.1 oz (99.2 kg) IBW/kg (Calculated) : 70.7   Vital Signs: Temp: 98.1 F (36.7 C) (02/07 0514) Temp src: Oral (02/07 0514) BP: 124/91 mmHg (02/07 0514) Pulse Rate: 84  (02/07 0514)  Labs:  Basename 07/25/12 0420 07/24/12 0507 07/23/12 0445  WBC 13.9* 14.2* 14.2*  HGB 13.9 13.5 13.9  PLT 200 191 190  LABCREA -- -- --  CREATININE -- -- 1.65*   Estimated Creatinine Clearance: 44.9 ml/min (by C-G formula based on Cr of 1.65). No results found for this basename: VANCOTROUGH:2,VANCOPEAK:2,VANCORANDOM:2,GENTTROUGH:2,GENTPEAK:2,GENTRANDOM:2,TOBRATROUGH:2,TOBRAPEAK:2,TOBRARND:2,AMIKACINPEAK:2,AMIKACINTROU:2,AMIKACIN:2, in the last 72 hours   Microbiology: Recent Results (from the past 720 hour(s))  MRSA PCR SCREENING     Status: Normal   Collection Time   07/17/12  6:46 PM      Component Value Range Status Comment   MRSA by PCR NEGATIVE  NEGATIVE Final   URINE CULTURE     Status: Normal   Collection Time   07/17/12 11:07 PM      Component Value Range Status Comment   Specimen Description URINE, CLEAN CATCH   Final    Special Requests NONE   Final    Culture  Setup Time 07/18/2012 09:18   Final    Colony Count 75,000 COLONIES/ML   Final    Culture ENTEROCOCCUS SPECIES   Final    Report Status 07/21/2012 FINAL   Final    Organism ID, Bacteria ENTEROCOCCUS SPECIES   Final    Antiobiotics: 1/30 >> Levaquin >> 2/5 2/5 >> Zosyn >> 2/5 >> Vanc >>  Assessment: 78 yo M on Day #3 Vanco 1500mg  IV q24h and Zosyn 3.375g IV q8h EI for PNA. 1/30 urine growing 75K enterococcus, sensitive to Vanco. CrCl 35ml/min (normalized = 40). No Vanco trough yet (not at steady state - has only received 2g loading dose and 1.5g x1 so far). Zosyn dose remains appropriate.  Goal of Therapy:  Vancomycin trough level 15-20  mcg/ml  Plan:  1) No changes to Vanco or Zosyn at this time.  Darrol Angel, PharmD Pager: [phone] 07/25/2012,8:47 AM

## 2012-07-26 ENCOUNTER — Inpatient Hospital Stay (HOSPITAL_COMMUNITY): Payer: BC Managed Care – PPO

## 2012-07-26 DIAGNOSIS — N189 Chronic kidney disease, unspecified: Secondary | ICD-10-CM | POA: Diagnosis not present

## 2012-07-26 DIAGNOSIS — N179 Acute kidney failure, unspecified: Secondary | ICD-10-CM | POA: Diagnosis not present

## 2012-07-26 DIAGNOSIS — R918 Other nonspecific abnormal finding of lung field: Secondary | ICD-10-CM | POA: Diagnosis not present

## 2012-07-26 DIAGNOSIS — N401 Enlarged prostate with lower urinary tract symptoms: Secondary | ICD-10-CM | POA: Diagnosis not present

## 2012-07-26 DIAGNOSIS — I82409 Acute embolism and thrombosis of unspecified deep veins of unspecified lower extremity: Secondary | ICD-10-CM | POA: Diagnosis not present

## 2012-07-26 LAB — CBC
Hemoglobin: 14 g/dL (ref 13.0–17.0)
MCH: 31.1 pg (ref 26.0–34.0)
MCHC: 32.3 g/dL (ref 30.0–36.0)
Platelets: 213 10*3/uL (ref 150–400)

## 2012-07-26 LAB — BASIC METABOLIC PANEL
BUN: 48 mg/dL — ABNORMAL HIGH (ref 6–23)
Calcium: 8.9 mg/dL (ref 8.4–10.5)
Creatinine, Ser: 2.06 mg/dL — ABNORMAL HIGH (ref 0.50–1.35)
GFR calc Af Amer: 35 mL/min — ABNORMAL LOW (ref >90)
GFR calc non Af Amer: 30 mL/min — ABNORMAL LOW (ref >90)

## 2012-07-26 LAB — GLUCOSE, CAPILLARY
Glucose-Capillary: 254 mg/dL — ABNORMAL HIGH (ref 70–99)
Glucose-Capillary: 135 mg/dL — ABNORMAL HIGH (ref 70–99)

## 2012-07-26 LAB — PROTIME-INR
INR: 3.62 — ABNORMAL HIGH (ref 0.00–1.49)
Prothrombin Time: 34 seconds — ABNORMAL HIGH (ref 11.6–15.2)

## 2012-07-26 MED ORDER — FUROSEMIDE 10 MG/ML IJ SOLN
40.0000 mg | Freq: Two times a day (BID) | INTRAMUSCULAR | Status: DC
  Administered 2012-07-26 – 2012-07-27 (×3): 40 mg via INTRAVENOUS
  Filled 2012-07-26 (×5): qty 4

## 2012-07-26 NOTE — Progress Notes (Signed)
[facility] PROGRESS NOTE  Alexander Kirby ZOX:[id number] DOB: 30-Oct-1934 DOA: 07/17/2012 PCP: Ailene Ravel, MD  Assessment/Plan: #1 acute respiratory failure  Likely multifactorial in nature secondary to probable acute PE, probable community-acquired pneumonia per patchy infiltrate seen on chest x-ray with symptoms of a productive cough that have been ongoing for several weeks- chest x ray on 2/4 showed worsening patchy infiltrate but BNP decreased- will broaden abx to vanc/zosyn and watch for improvment, in the setting of COPD. Patient looks less labored today. Patient currently 4 L nasal cannula  Sputum Gram stain and culture pending. Urine Legionella antigen negative. Urine pneumococcus antigen negative.  Continue oxygen, continue nebs. PO steroids. Chest PT. Unable to get CT angiogram secondary to chronic kidney disease. Unable to get VQ scan as patient can't lay flat. Follow. We'll also place on Ativan as needed.  Patient is tenous with high risk for intubation. Pulm signed off   #2 hypoxia  Likely multifactorial secondary to probable PE in a patient with left lower extremity DVT per Doppler ultrasound in the setting of probable community-acquired pneumonia per chest x-ray findings and symptoms. Patient was tachycardic on presentation which is improved. Sputum Gram stain and cultures pending. Urine Legionella antigen negative. Urine pneumococcus antigen negative. See above   #3 left lower extremity DVT  Patient was started on IV heparin in the ED and a such a hypercoagulable panel was unable to be obtained. Coumadin- INR (lovenox)   #4 probable PE  Patient had presented with severe hypoxia with sats in 80% requiring a face mask. Patient was tachycardic. Lower extremity Dopplers were positive for left lower extremity DVT. Unable to get CT angiogram secondary to renal function. Heparin was started in the emergency room and a such unable to obtain a hypercoagulable panel. Unable to get VQ  scan as patient could not lay flat. 2-D echo with EF of 65-70%. Right ventricle was mildly to moderately dilated. Intraventricular septum was D-shaped with evidence for right ventricular pressure/volume overload which could be consistent with a large PE.    #5 COPD exac  On presentation patient did have some wheezing. Wheezing improved but poor to fair air movement and some use of accessory muscles of respiration. PO steroids  Chest PT. Flutter valve. Patient is on mucolytics.   #6 probable community-acquired pneumoniavs bronchitis  Patient had presented with several weeks of productive cough. Chest x-ray with patchy infiltrates. Patient also did have a leukocytosis on admission. Sputum Gram stain and cultures pending. Urine Legionella antigen negative. Urine pneumococcus antigen negative. Leukocytosis is fluctuating in part also due to steriods. Broaden abx, oxygen, nebs, Robitussin as needed.   #7 acute on chronic kidney disease stage III  Her PCPs records patient's last creatinine was 1.79 on 09/28/2011. On admission creatinine was 1.99. Renal ultrasound with slight thinning of the renal cortices. No acute abnormalities. ACE inhibitor and diuretics are on hold. Renal function has improved and close to baseline today at 1.70. Follow.   #8 Hx of anemia  H&H is stable.   #9 leukocytosis  Likely reactive secondary to probable PE and probable community-acquired pneumonia in setting of steriods. Sputum Gram stain and culture pending. Urinalysis is unremarkable. Urine culture with 75,000 of enterococcus species sensitive to Levaquin. Chest x-ray with patchy infiltrates. Urine Legionella antigen negative. Urine pneumococcus antigen negative. Leukocytosis is fluctuating in part also due to steriods.   #10 diabetes mellitus  Hemoglobin A1c is 8.4. CBGs have ranged from 207-269. Increase Lantus to 10 units daily. Continue sliding  scale insulin- not compliant at home  #11 hypertension  Stable.  Follow.   Resume home meds   #12 prophylaxis  PPI for GI prophylaxis. On full dose heparin for DVT.    #13 urinary retention -foley placed -> 1 L out so far  #14 hematuria - Dr. Isabel Caprice to see on Monday AM   Code Status: Full  Family Communication: Updated patient and wife Disposition Plan: Home vs SNF   Consultants:  PC CM: Dr Sung Amabile 07/21/2012- signed off 2/4   Procedures:  Chest x-ray 07/17/2012  Lower extremity Dopplers 07/17/2012  2-D echo 07/18/2012  Renal ultrasound 07/18/2012   Antibiotics:  IV Levaquin 07/17/2012--->07/19/12  Broad spectrum abx   HPI/Subjective: C/o blood in foley Appetite better Thinks he is eating better  Objective: Filed Vitals:   07/25/12 2109 07/26/12 0210 07/26/12 0724 07/26/12 0830  BP: 146/72  106/71   Pulse: 92  76   Temp: 98.2 F (36.8 C)  98.1 F (36.7 C)   TempSrc: Oral  Oral   Resp: 20  18   Height:      Weight:   98.8 kg (217 lb 13 oz)   SpO2: 95% 92% 96% 96%    Intake/Output Summary (Last 24 hours) at 07/26/12 1004 Last data filed at 07/26/12 0725  Gross per 24 hour  Intake    240 ml  Output   3450 ml  Net  -3210 ml   Filed Weights   07/20/12 0410 07/22/12 0400 07/26/12 0724  Weight: 96.8 kg (213 lb 6.5 oz) 99.2 kg (218 lb 11.1 oz) 98.8 kg (217 lb 13 oz)    Exam:   General:  A+Ox3, NAD  Cardiovascular: rrr  Respiratory: dec BS b/l, no wheezing  Abdomen: +BS, soft, NT  Data Reviewed: Basic Metabolic Panel:  Recent Labs Lab 07/20/12 0346 07/21/12 0340 07/22/12 0342 07/23/12 0445 07/26/12 0458  NA 135 132* 132* 132* 136  K 4.4 4.8 4.5 4.8 4.4  CL 101 99 99 99 98  CO2 24 23 24 27 29   GLUCOSE 143* 178* 232* 211* 252*  BUN 46* 47* 51* 48* 48*  CREATININE 1.75* 1.68* 1.70* 1.65* 2.06*  CALCIUM 8.8 8.7 8.8 8.9 8.9   Liver Function Tests: No results found for this basename: AST, ALT, ALKPHOS, BILITOT, PROT, ALBUMIN,  in the last 168 hours No results found for this basename: LIPASE, AMYLASE,  in the  last 168 hours No results found for this basename: AMMONIA,  in the last 168 hours CBC:  Recent Labs Lab 07/22/12 0342 07/23/12 0445 07/24/12 0507 07/25/12 0420 07/26/12 0458  WBC 13.5* 14.2* 14.2* 13.9* 16.9*  HGB 14.4 13.9 13.5 13.9 14.0  HCT 43.7 42.3 42.2 43.5 43.3  MCV 94.6 94.6 96.6 97.1 96.2  PLT 188 190 191 200 213   Cardiac Enzymes: No results found for this basename: CKTOTAL, CKMB, CKMBINDEX, TROPONINI,  in the last 168 hours BNP (last 3 results)  Recent Labs  07/17/12 1315 07/23/12 0445  PROBNP 1312.0* 393.8   CBG:  Recent Labs Lab 07/25/12 0742 07/25/12 1150 07/25/12 1633 07/25/12 2150 07/26/12 0401  GLUCAP 181* 242* 250* 347* 188*    Recent Results (from the past 240 hour(s))  MRSA PCR SCREENING     Status: None   Collection Time    07/17/12  6:46 PM      Result Value Range Status   MRSA by PCR NEGATIVE  NEGATIVE Final   Comment:  The GeneXpert MRSA Assay (FDA     approved for NASAL specimens     only), is one component of a     comprehensive MRSA colonization     surveillance program. It is not     intended to diagnose MRSA     infection nor to guide or     monitor treatment for     MRSA infections.  URINE CULTURE     Status: None   Collection Time    07/17/12 11:07 PM      Result Value Range Status   Specimen Description URINE, CLEAN CATCH   Final   Special Requests NONE   Final   Culture  Setup Time 07/18/2012 09:18   Final   Colony Count 75,000 COLONIES/ML   Final   Culture ENTEROCOCCUS SPECIES   Final   Report Status 07/21/2012 FINAL   Final   Organism ID, Bacteria ENTEROCOCCUS SPECIES   Final     Studies: No results found.  Scheduled Meds: . albuterol  2.5 mg Nebulization Q6H  . antiseptic oral rinse  15 mL Mouth Rinse BID  . budesonide  0.25 mg Nebulization Q6H  . enalapril  10 mg Oral Daily   And  . hydrochlorothiazide  25 mg Oral Daily  . fluticasone  1 spray Each Nare Daily  . furosemide  40 mg Intravenous  BID  . guaiFENesin  1,200 mg Oral BID  . insulin aspart  0-20 Units Subcutaneous TID WC  . insulin glargine  15 Units Subcutaneous QHS  . ipratropium  0.5 mg Nebulization Q6H  . mometasone-formoterol  2 puff Inhalation BID  . piperacillin-tazobactam (ZOSYN)  IV  3.375 g Intravenous Q8H  . predniSONE  40 mg Oral Q breakfast  . sodium chloride  3 mL Intravenous Q12H  . Tamsulosin HCl  0.4 mg Oral QPC supper  . vancomycin  1,500 mg Intravenous Q24H  . Warfarin - Pharmacist Dosing Inpatient   Does not apply q1800   Continuous Infusions:   Principal Problem:   Acute respiratory failure Active Problems:   DVT (deep venous thrombosis)   COPD with exacerbation   Diabetes mellitus   HTN (hypertension)   Hyperlipidemia   CKD (chronic kidney disease) stage 3, GFR 30-59 ml/min   BPH (benign prostatic hypertrophy) with urinary obstruction   Calculus of kidney   PE, presumed   Bronchitis   Urinary retention with incomplete bladder emptying    Time spent: 35    [facility], Adelin Ventrella  [facility] Pager [phone]. If 8PM-8AM, please contact night-coverage at [email or web address], password [facility] 07/26/2012, 10:04 AM  LOS: 9 days

## 2012-07-26 NOTE — Progress Notes (Signed)
ANTIBIOTIC CONSULT NOTE - Brief note  Pharmacy Consult for Vanco and Zosyn Indication: PNA  Assessment:  78 yo M on Day 4 Vanc 1500mg  IV q24h and Zosyn 3.375g IV q8h for PNA.  SCr rising.  Goal of Therapy:  Vancomycin trough level 15-20 mcg/ml  Plan:   No changes to Vanc or Zosyn at this time.  Will check Vanc trough and SCr prior to dose on 2/9.  Will hold Vanc doses pending the result.  Charolotte Eke, PharmD, pager [phone]. 07/26/2012,9:23 AM.

## 2012-07-26 NOTE — Progress Notes (Signed)
Physical Therapy Treatment Patient Details Name: Alexander Kirby MRN: [id number] DOB: 24-Sep-1934 Today's Date: 07/26/2012 Time: 1030-1106 PT Time Calculation (min): 36 min  PT Assessment / Plan / Recommendation Comments on Treatment Session  Pt. tolerated ambulation on 4 L with sats lowest 89%. Hr 11o. Pt encouraged by progress today. Pt. may be a good candidate for in pt rehab at [facility]) if pt would be agreeable.    Follow Up Recommendations  Home health PT;[facility]     Does the patient have the potential to tolerate intense rehabilitation     Barriers to Discharge        Equipment Recommendations  Rolling walker with 5" wheels    Recommendations for Other Services    Frequency Min 3X/week   Plan Discharge plan remains appropriate;Frequency remains appropriate    Precautions / Restrictions Precautions Precaution Comments: monitor sats/HR on O2   Pertinent Vitals/Pain sats lowest 89% on 4  L, dyspnea 3/4 HR 110 max.    Mobility  Transfers Transfers: Sit to Stand;Stand to Sit Sit to Stand: 5: Supervision;With upper extremity assist;With armrests Stand to Sit: 5: Supervision;With upper extremity assist Stand Pivot Transfers: With armrests Details for Transfer Assistance: pt stood for 4 minutes prior to sitting down. Ambulation/Gait Ambulation/Gait Assistance: 1: +2 Total assist Ambulation/Gait: Patient Percentage: 80% Ambulation Distance (Feet): 40 Feet Assistive device: Rolling walker Ambulation/Gait Assistance Details: pt. walks very slowly, stops fro breaks, instructed in opursed lip breathing. Gait Pattern: Step-through pattern    Exercises     PT Diagnosis:    PT Problem List:   PT Treatment Interventions:     PT Goals Acute Rehab PT Goals Pt will go Sit to Stand: with modified independence PT Goal: Sit to Stand - Progress: Progressing toward goal Pt will go Stand to Sit: with modified independence PT Goal: Stand to Sit - Progress: Progressing toward goal Pt will  Ambulate: with modified independence;1 - 15 feet PT Goal: Ambulate - Progress: Progressing toward goal  Visit Information  Last PT Received On: 07/26/12 Assistance Needed: +2    Subjective Data  Subjective: I just don't know why things aren't organized.   Cognition  Cognition Overall Cognitive Status: Appears within functional limits for tasks assessed/performed Arousal/Alertness: Awake/alert Behavior During Session: Other (comment) Cognition - Other Comments: pt appears irritable about how thingas a re going here. encouraged pt. that he is iomproving. Pt. was appreciative.    Balance  Static Sitting Balance Static Sitting - Balance Support: Bilateral upper extremity supported Static Sitting - Level of Assistance: 6: Modified independent (Device/Increase time) Static Standing Balance Static Standing - Balance Support: Bilateral upper extremity supported Static Standing - Level of Assistance: 5: Stand by assistance Static Standing - Comment/# of Minutes: at RW  End of Session PT - End of Session Equipment Utilized During Treatment: Oxygen Activity Tolerance: Patient limited by fatigue;Other (comment) (Pt. noted dyspnea.) Patient left: in chair;with family/visitor present Nurse Communication: Mobility status   GP     Rada Hay 07/26/2012, 11:35 AM

## 2012-07-26 NOTE — Progress Notes (Signed)
ANTICOAGULATION CONSULT NOTE - Follow Up  Pharmacy Consult for Warfarin Indication: LLE DVT and Probable PE  No Known Allergies  Patient Measurements: Height: 5\' 9"  (175.3 cm) Weight: 217 lb 13 oz (98.8 kg) IBW/kg (Calculated) : 70.7  Labs:  Recent Labs  07/24/12 0507 07/25/12 0420 07/26/12 0458  HGB 13.5 13.9 14.0  HCT 42.2 43.5 43.3  PLT 191 200 213  LABPROT 20.4* 23.5* 34.0*  INR 1.82* 2.20* 3.62*  CREATININE  --   --  2.06*    Estimated Creatinine Clearance: 35.9 ml/min (by C-G formula based on Cr of 2.06).  Anticoag Meds:  1/30 >> IV heparin >> 2/4 1/31 >> Warfarin PO >> 2/6 >> Lovenox >> 2/8  Assessment:  78 yo M admitted on 07/17/12 with acute resp failure, started on IV heparin for LE DVT. IV heparin stopped on 2/4 after completion of 5 day overlap with warfarin and heparin.  Lovenox started 2/6 for subtherapeutic INR in setting of acute LLE DVT and probable PE (unable to complete CT due to renal fxn; hx CKD III).  INR is supratherapeutic today, rose significantly from yesterday.   Lovenox was stopped.  CBC ok despite report of blood in urine after catheter insertion yesterday.  Tolerating diet ok.  Broad-spectrum abx can increase INR sensitivity.  Goal of Therapy:  Monitor platelets by anticoagulation protocol: Yes INR 2-3   Plan:   Hold Coumadin today.  F/u INR in am.  Charolotte Eke, PharmD, pager [phone]. 07/26/2012,9:08 AM.

## 2012-07-26 NOTE — Progress Notes (Signed)
Report received from Wilkie Aye, RN. No change from initial am assessment. Will continue to follow the plan of care.

## 2012-07-27 DIAGNOSIS — N179 Acute kidney failure, unspecified: Secondary | ICD-10-CM | POA: Diagnosis not present

## 2012-07-27 DIAGNOSIS — N401 Enlarged prostate with lower urinary tract symptoms: Secondary | ICD-10-CM | POA: Diagnosis not present

## 2012-07-27 DIAGNOSIS — I82409 Acute embolism and thrombosis of unspecified deep veins of unspecified lower extremity: Secondary | ICD-10-CM | POA: Diagnosis not present

## 2012-07-27 LAB — VANCOMYCIN, TROUGH: Vancomycin Tr: 27.5 ug/mL (ref 10.0–20.0)

## 2012-07-27 LAB — BASIC METABOLIC PANEL WITH GFR
BUN: 56 mg/dL — ABNORMAL HIGH (ref 6–23)
CO2: 31 meq/L (ref 19–32)
Calcium: 8.7 mg/dL (ref 8.4–10.5)
Chloride: 97 meq/L (ref 96–112)
Creatinine, Ser: 2.65 mg/dL — ABNORMAL HIGH (ref 0.50–1.35)
GFR calc Af Amer: 26 mL/min — ABNORMAL LOW
GFR calc non Af Amer: 22 mL/min — ABNORMAL LOW
Glucose, Bld: 216 mg/dL — ABNORMAL HIGH (ref 70–99)
Potassium: 4.1 meq/L (ref 3.5–5.1)
Sodium: 136 meq/L (ref 135–145)

## 2012-07-27 LAB — CBC
Hemoglobin: 13.6 g/dL (ref 13.0–17.0)
MCH: 31.1 pg (ref 26.0–34.0)
Platelets: 222 10*3/uL (ref 150–400)
RBC: 4.38 MIL/uL (ref 4.22–5.81)
WBC: 17.1 10*3/uL — ABNORMAL HIGH (ref 4.0–10.5)

## 2012-07-27 LAB — GLUCOSE, CAPILLARY
Glucose-Capillary: 136 mg/dL — ABNORMAL HIGH (ref 70–99)
Glucose-Capillary: 206 mg/dL — ABNORMAL HIGH (ref 70–99)

## 2012-07-27 LAB — PROTIME-INR
INR: 3.14 — ABNORMAL HIGH (ref 0.00–1.49)
Prothrombin Time: 30.6 seconds — ABNORMAL HIGH (ref 11.6–15.2)

## 2012-07-27 LAB — VANCOMYCIN, RANDOM: Vancomycin Rm: 22.5 ug/mL

## 2012-07-27 MED ORDER — FUROSEMIDE 10 MG/ML IJ SOLN
20.0000 mg | Freq: Two times a day (BID) | INTRAMUSCULAR | Status: DC
  Filled 2012-07-27 (×2): qty 2

## 2012-07-27 MED ORDER — WARFARIN SODIUM 1 MG PO TABS
1.0000 mg | ORAL_TABLET | Freq: Once | ORAL | Status: AC
  Administered 2012-07-27: 1 mg via ORAL
  Filled 2012-07-27: qty 1

## 2012-07-27 NOTE — Progress Notes (Signed)
ANTICOAGULATION CONSULT NOTE - Follow Up  Pharmacy Consult for Warfarin Indication: LLE DVT and Probable PE  No Known Allergies  Patient Measurements: Height: 5\' 9"  (175.3 cm) Weight: 217 lb 13 oz (98.8 kg) IBW/kg (Calculated) : 70.7  Labs:  Recent Labs  07/25/12 0420 07/26/12 0458 07/27/12 0430  HGB 13.9 14.0 13.6  HCT 43.5 43.3 41.9  PLT 200 213 222  LABPROT 23.5* 34.0* 30.6*  INR 2.20* 3.62* 3.14*  CREATININE  --  2.06*  --     Estimated Creatinine Clearance: 35.9 ml/min (by C-G formula based on Cr of 2.06).  Anticoag Meds:  1/30 >> IV heparin >> 2/4 1/31 >> Warfarin PO >> 2/6 >> Lovenox >> 2/8  Assessment:  78 yo M admitted on 07/17/12 with acute resp failure, started on IV heparin for LE DVT. IV heparin stopped on 2/4 after completion of 5 day overlap with warfarin and heparin.  Lovenox started 2/6 for subtherapeutic INR in setting of acute LLE DVT and probable PE (unable to complete CT due to renal fxn; hx CKD III).  INR still supratherapeutic today but coming down after holding yesterday.   CBC ok. No further hematuria documented.  Tolerating diet ok.  Broad-spectrum abx can increase INR sensitivity.  Goal of Therapy:  Monitor platelets by anticoagulation protocol: Yes INR 2-3   Plan:   Coumadin 1mg  today to hopefully keep INR from falling too quickly.  F/u INR in am.  Charolotte Eke, PharmD, pager [phone]. 07/27/2012,9:07 AM.

## 2012-07-27 NOTE — Progress Notes (Signed)
CRITICAL VALUE ALERT  Critical value received:  Vancomycin level 27.5  Date of notification:  07/27/2012  Time of notification:  0940  Critical value read back:yes  Nurse who received alert:  Murrell Redden, RN  MD notified (1st page):  Nicanor Bake D   Time of first page:  0945  MD notified (2nd page):  Time of second page:  Responding MD:  Dorethea Clan, PharmD  Time MD responded: [phone]

## 2012-07-27 NOTE — Progress Notes (Signed)
ANTIBIOTIC CONSULT NOTE - FOLLOW UP  Pharmacy Consult for Vanco and Zosyn Indication: PNA  No Known Allergies  Patient Measurements: Height: 5\' 9"  (175.3 cm) Weight: 217 lb 6 oz (98.6 kg) IBW/kg (Calculated) : 70.7  Vital Signs: Temp: 97.2 F (36.2 C) (02/09 0616) Temp src: Oral (02/09 0616) BP: 94/51 mmHg (02/09 0616) Pulse Rate: 83 (02/09 0616)  Labs:  Recent Labs  07/25/12 0420 07/26/12 0458 07/27/12 0430 07/27/12 0901  WBC 13.9* 16.9* 17.1*  --   HGB 13.9 14.0 13.6  --   PLT 200 213 222  --   CREATININE  --  2.06*  --  2.65*   Estimated Creatinine Clearance: 27.9 ml/min (by C-G formula based on Cr of 2.65).  Recent Labs  07/27/12 0902  VANCOTROUGH 27.5*     Microbiology: Recent Results (from the past 720 hour(s))  MRSA PCR SCREENING     Status: None   Collection Time    07/17/12  6:46 PM      Result Value Range Status   MRSA by PCR NEGATIVE  NEGATIVE Final   Comment:            The GeneXpert MRSA Assay (FDA     approved for NASAL specimens     only), is one component of a     comprehensive MRSA colonization     surveillance program. It is not     intended to diagnose MRSA     infection nor to guide or     monitor treatment for     MRSA infections.  URINE CULTURE     Status: None   Collection Time    07/17/12 11:07 PM      Result Value Range Status   Specimen Description URINE, CLEAN CATCH   Final   Special Requests NONE   Final   Culture  Setup Time 07/18/2012 09:18   Final   Colony Count 75,000 COLONIES/ML   Final   Culture ENTEROCOCCUS SPECIES   Final   Report Status 07/21/2012 FINAL   Final   Organism ID, Bacteria ENTEROCOCCUS SPECIES   Final   Antiobiotics: 1/30 >> Levaquin >> 2/5 2/5 >> Zosyn >> 2/5 >> Vanc >>  Assessment:  78 yo M on Day #5 Vanc and Zosyn for PNA and enterococcal UTI.   Afebrile, WBCs trending up(on prednisone).  SCr rising, CrCl ~36.  Vanc trough above target range.  Goal of Therapy:  Vancomycin trough  level 15-20 mcg/ml  Plan:   Cont to hold Vanc doses and check a random level tonight to assess clearance.  Cont Zosyn 3.375g IV Q8H infused over 4hrs.  F/u daily.  Charolotte Eke, PharmD, pager [phone]. 07/27/2012,9:53 AM.

## 2012-07-27 NOTE — Progress Notes (Signed)
[facility] PROGRESS NOTE  Alexander Kirby ZOX:[id number] DOB: 1934-08-04 DOA: 07/17/2012 PCP: Ailene Ravel, MD  Assessment/Plan: #1 acute respiratory failure  Likely multifactorial in nature secondary to probable acute PE, probable community-acquired pneumonia per patchy infiltrate seen on chest x-ray with symptoms of a productive cough that have been ongoing for several weeks- chest x ray on 2/4 showed worsening patchy infiltrate but BNP decreased- will broaden abx to vanc/zosyn and watch for improvment, in the setting of COPD. Patient looks less labored today. Patient currently 4 L nasal cannula  Sputum Gram stain and culture pending. Urine Legionella antigen negative. Urine pneumococcus antigen negative.  Continue oxygen, continue nebs. PO steroids. Chest PT. Unable to get CT angiogram secondary to chronic kidney disease. Unable to get VQ scan as patient can't lay flat. Follow. We'll also place on Ativan as needed.  Patient is tenous with high risk for intubation. Pulm signed off   #2 hypoxia  Likely multifactorial secondary to probable PE in a patient with left lower extremity DVT per Doppler ultrasound in the setting of probable community-acquired pneumonia per chest x-ray findings and symptoms. Patient was tachycardic on presentation which is improved. Sputum Gram stain and cultures pending. Urine Legionella antigen negative. Urine pneumococcus antigen negative. See above   #3 left lower extremity DVT  Patient was started on IV heparin in the ED and a such a hypercoagulable panel was unable to be obtained. Coumadin- INR (lovenox)   #4 probable PE  Patient had presented with severe hypoxia with sats in 80% requiring a face mask. Patient was tachycardic. Lower extremity Dopplers were positive for left lower extremity DVT. Unable to get CT angiogram secondary to renal function. Heparin was started in the emergency room and a such unable to obtain a hypercoagulable panel. Unable to get VQ  scan as patient could not lay flat. 2-D echo with EF of 65-70%. Right ventricle was mildly to moderately dilated. Intraventricular septum was D-shaped with evidence for right ventricular pressure/volume overload which could be consistent with a large PE.    #5 COPD exac  On presentation patient did have some wheezing. Wheezing improved but poor to fair air movement and some use of accessory muscles of respiration. PO steroids  Chest PT. Flutter valve. Patient is on mucolytics.   #6 probable community-acquired pneumoniavs bronchitis  Patient had presented with several weeks of productive cough. Chest x-ray with patchy infiltrates. Patient also did have a leukocytosis on admission. Sputum Gram stain and cultures pending. Urine Legionella antigen negative. Urine pneumococcus antigen negative. Leukocytosis is fluctuating in part also due to steriods. Broaden abx, oxygen, nebs, Robitussin as needed.   #7 acute on chronic kidney disease stage III  Her PCPs records patient's last creatinine was 1.79 on 09/28/2011. On admission creatinine was 1.99. Renal ultrasound with slight thinning of the renal cortices. No acute abnormalities. ACE inhibitor and diuretics are on hold. Renal function has worsened with lasix but breathing improved will d/c and continue to follow   #8 Hx of anemia  H&H is stable.   #9 leukocytosis  Likely reactive secondary to probable PE and probable community-acquired pneumonia in setting of steriods. Sputum Gram stain and culture pending. Urinalysis is unremarkable. Urine culture with 75,000 of enterococcus species sensitive to Levaquin. Chest x-ray with patchy infiltrates. Urine Legionella antigen negative. Urine pneumococcus antigen negative. Leukocytosis is fluctuating in part also due to steriods.   #10 diabetes mellitus  Hemoglobin A1c is 8.4. CBGs have ranged from 207-269. Increase Lantus to 10 units  daily. Continue sliding scale insulin- not compliant at home  #11  hypertension  Stable.  Follow.  Resume home meds   #12 prophylaxis  PPI for GI prophylaxis. On full dose heparin for DVT.    #13 urinary retention -foley placed   #14 hematuria - Dr. Isabel Caprice to see on Monday AM   Code Status: Full  Family Communication: Updated patient and wife Disposition Plan: Home vs SNF   Consultants:  PC CM: Dr Sung Amabile 07/21/2012- signed off 2/4   Procedures:  Chest x-ray 07/17/2012  Lower extremity Dopplers 07/17/2012  2-D echo 07/18/2012  Renal ultrasound 07/18/2012   Antibiotics:  IV Levaquin 07/17/2012--->07/19/12  Broad spectrum abx   HPI/Subjective: C/o blood in foley Appetite better Thinks he is eating better  Objective: Filed Vitals:   07/27/12 0223 07/27/12 0616 07/27/12 0818 07/27/12 0936  BP:  94/51    Pulse:  83    Temp:  97.2 F (36.2 C)    TempSrc:  Oral    Resp:  19    Height:      Weight:    98.6 kg (217 lb 6 oz)  SpO2: 95% 95% 96%     Intake/Output Summary (Last 24 hours) at 07/27/12 1131 Last data filed at 07/27/12 0620  Gross per 24 hour  Intake    619 ml  Output   3525 ml  Net  -2906 ml   Filed Weights   07/22/12 0400 07/26/12 0724 07/27/12 0936  Weight: 99.2 kg (218 lb 11.1 oz) 98.8 kg (217 lb 13 oz) 98.6 kg (217 lb 6 oz)    Exam:   General:  A+Ox3, NAD  Cardiovascular: rrr  Respiratory: dec BS b/l, no wheezing  Abdomen: +BS, soft, NT  Data Reviewed: Basic Metabolic Panel:  Recent Labs Lab 07/21/12 0340 07/22/12 0342 07/23/12 0445 07/26/12 0458 07/27/12 0901  NA 132* 132* 132* 136 136  K 4.8 4.5 4.8 4.4 4.1  CL 99 99 99 98 97  CO2 23 24 27 29 31   GLUCOSE 178* 232* 211* 252* 216*  BUN 47* 51* 48* 48* 56*  CREATININE 1.68* 1.70* 1.65* 2.06* 2.65*  CALCIUM 8.7 8.8 8.9 8.9 8.7   Liver Function Tests: No results found for this basename: AST, ALT, ALKPHOS, BILITOT, PROT, ALBUMIN,  in the last 168 hours No results found for this basename: LIPASE, AMYLASE,  in the last 168 hours No  results found for this basename: AMMONIA,  in the last 168 hours CBC:  Recent Labs Lab 07/23/12 0445 07/24/12 0507 07/25/12 0420 07/26/12 0458 07/27/12 0430  WBC 14.2* 14.2* 13.9* 16.9* 17.1*  HGB 13.9 13.5 13.9 14.0 13.6  HCT 42.3 42.2 43.5 43.3 41.9  MCV 94.6 96.6 97.1 96.2 95.7  PLT 190 191 200 213 222   Cardiac Enzymes: No results found for this basename: CKTOTAL, CKMB, CKMBINDEX, TROPONINI,  in the last 168 hours BNP (last 3 results)  Recent Labs  07/17/12 1315 07/23/12 0445  PROBNP 1312.0* 393.8   CBG:  Recent Labs Lab 07/26/12 0401 07/26/12 1217 07/26/12 1649 07/26/12 2201 07/27/12 0742  GLUCAP 188* 272* 254* 135* 136*    Recent Results (from the past 240 hour(s))  MRSA PCR SCREENING     Status: None   Collection Time    07/17/12  6:46 PM      Result Value Range Status   MRSA by PCR NEGATIVE  NEGATIVE Final   Comment:            The GeneXpert MRSA  Assay (FDA     approved for NASAL specimens     only), is one component of a     comprehensive MRSA colonization     surveillance program. It is not     intended to diagnose MRSA     infection nor to guide or     monitor treatment for     MRSA infections.  URINE CULTURE     Status: None   Collection Time    07/17/12 11:07 PM      Result Value Range Status   Specimen Description URINE, CLEAN CATCH   Final   Special Requests NONE   Final   Culture  Setup Time 07/18/2012 09:18   Final   Colony Count 75,000 COLONIES/ML   Final   Culture ENTEROCOCCUS SPECIES   Final   Report Status 07/21/2012 FINAL   Final   Organism ID, Bacteria ENTEROCOCCUS SPECIES   Final     Studies: Dg Chest Port 1 View  07/26/2012  *RADIOLOGY REPORT*  Clinical Data: Short of breath, weakness  PORTABLE CHEST - 1 VIEW  Comparison: Chest radiograph 07/23/2012  Findings: Normal cardiac silhouette.  There are bibasilar air space opacities which are similar to prior.  Upper lungs are clear.  No aggressive osseous lesions  IMPRESSION:  Bibasilar opacities concerning for bibasilar pneumonia.   Original Report Authenticated By: Genevive Bi, M.D.     Scheduled Meds: . albuterol  2.5 mg Nebulization Q6H  . antiseptic oral rinse  15 mL Mouth Rinse BID  . budesonide  0.25 mg Nebulization Q6H  . enalapril  10 mg Oral Daily  . fluticasone  1 spray Each Nare Daily  . guaiFENesin  1,200 mg Oral BID  . insulin aspart  0-20 Units Subcutaneous TID WC  . insulin glargine  15 Units Subcutaneous QHS  . ipratropium  0.5 mg Nebulization Q6H  . mometasone-formoterol  2 puff Inhalation BID  . piperacillin-tazobactam (ZOSYN)  IV  3.375 g Intravenous Q8H  . predniSONE  40 mg Oral Q breakfast  . sodium chloride  3 mL Intravenous Q12H  . Tamsulosin HCl  0.4 mg Oral QPC supper  . warfarin  1 mg Oral ONCE-1800  . Warfarin - Pharmacist Dosing Inpatient   Does not apply q1800   Continuous Infusions:   Principal Problem:   Acute respiratory failure Active Problems:   DVT (deep venous thrombosis)   COPD with exacerbation   Diabetes mellitus   HTN (hypertension)   Hyperlipidemia   CKD (chronic kidney disease) stage 3, GFR 30-59 ml/min   BPH (benign prostatic hypertrophy) with urinary obstruction   Calculus of kidney   PE, presumed   Bronchitis   Urinary retention with incomplete bladder emptying    Time spent: 35    [facility], Adrianah Prophete  [facility] Pager [phone]. If 8PM-8AM, please contact night-coverage at [email or web address], password [facility] 07/27/2012, 11:31 AM  LOS: 10 days

## 2012-07-28 DIAGNOSIS — N179 Acute kidney failure, unspecified: Secondary | ICD-10-CM | POA: Diagnosis not present

## 2012-07-28 DIAGNOSIS — I82409 Acute embolism and thrombosis of unspecified deep veins of unspecified lower extremity: Secondary | ICD-10-CM | POA: Diagnosis not present

## 2012-07-28 DIAGNOSIS — J9600 Acute respiratory failure, unspecified whether with hypoxia or hypercapnia: Secondary | ICD-10-CM | POA: Diagnosis not present

## 2012-07-28 DIAGNOSIS — J441 Chronic obstructive pulmonary disease with (acute) exacerbation: Secondary | ICD-10-CM

## 2012-07-28 LAB — CBC
HCT: 39.5 % (ref 39.0–52.0)
MCH: 30.8 pg (ref 26.0–34.0)
MCV: 95 fL (ref 78.0–100.0)
Platelets: 230 10*3/uL (ref 150–400)
RDW: 14.1 % (ref 11.5–15.5)

## 2012-07-28 LAB — BASIC METABOLIC PANEL
BUN: 59 mg/dL — ABNORMAL HIGH (ref 6–23)
CO2: 28 mEq/L (ref 19–32)
Chloride: 95 mEq/L — ABNORMAL LOW (ref 96–112)
Creatinine, Ser: 2.45 mg/dL — ABNORMAL HIGH (ref 0.50–1.35)
GFR calc Af Amer: 28 mL/min — ABNORMAL LOW (ref >90)
Potassium: 3.8 mEq/L (ref 3.5–5.1)

## 2012-07-28 LAB — GLUCOSE, CAPILLARY
Glucose-Capillary: 212 mg/dL — ABNORMAL HIGH (ref 70–99)
Glucose-Capillary: 259 mg/dL — ABNORMAL HIGH (ref 70–99)

## 2012-07-28 MED ORDER — VANCOMYCIN HCL IN DEXTROSE 1-5 GM/200ML-% IV SOLN
1000.0000 mg | INTRAVENOUS | Status: DC
  Administered 2012-07-28 – 2012-07-30 (×3): 1000 mg via INTRAVENOUS
  Filled 2012-07-28 (×4): qty 200

## 2012-07-28 MED ORDER — PREDNISONE 20 MG PO TABS
30.0000 mg | ORAL_TABLET | Freq: Every day | ORAL | Status: DC
  Administered 2012-07-29 – 2012-08-02 (×5): 30 mg via ORAL
  Filled 2012-07-28 (×6): qty 1

## 2012-07-28 MED ORDER — WARFARIN SODIUM 2 MG PO TABS
2.0000 mg | ORAL_TABLET | Freq: Once | ORAL | Status: AC
  Administered 2012-07-28: 2 mg via ORAL
  Filled 2012-07-28: qty 1

## 2012-07-28 MED ORDER — OXYCODONE HCL 5 MG PO TABS
5.0000 mg | ORAL_TABLET | Freq: Once | ORAL | Status: AC
  Administered 2012-07-28: 5 mg via ORAL

## 2012-07-28 MED ORDER — FUROSEMIDE 20 MG PO TABS
20.0000 mg | ORAL_TABLET | Freq: Every day | ORAL | Status: DC
  Administered 2012-07-28 – 2012-07-29 (×2): 20 mg via ORAL
  Filled 2012-07-28 (×2): qty 1

## 2012-07-28 NOTE — Consult Note (Signed)
Physical Medicine and Rehabilitation Consult Reason for Consult: Deconditioning/respiratory failure/DVT Referring Physician: [facility]   HPI: Alexander Kirby is a 78 y.o. right-handed male with history of non-oxygen dependent COPD former smoker, diabetes mellitus with peripheral neuropathy, chronic renal insufficiency with baseline creatinine 1.83. Patient independent up until the past couple of weeks. Admitted 07/17/2012 with progressive shortness of breath, wheezing and productive cough with generalized weakness and lower extremity edema. Patient with recent exacerbation of COPD presented to her primary care physician and was placed on doxycycline. Noted oxygen saturations 80% on room air blood pressure 84/70. Findings a white blood cell count elevated 19,400. Lower extremity Dopplers findings consistent with acute DVT posterior tibial, popliteal and femoral veins of left lower extremity. Attempts at CT angiogram of the chest not obtained as patient was unable to lay flat. Chest x-ray overall hyperinflation consistent with COPD with patchy infiltrative densities right lung base. Patient was placed on broad-spectrum antibiotics. Renal ultrasound showed no acute abnormalities. Placed on subcutaneous Lovenox Coumadin for DVT prophylaxis. Echocardiogram with ejection fraction of 70% grade 1 diastolic dysfunction. Critical care followup for acute respiratory failure/suspect community-acquired pneumonia maintained on steroid protocol. Hemoglobin A1c 8.4 and monitored on insulin therapy . Bouts of urinary retention with hematuria and history of BPH with Foley catheter tube placed and await urology consult. Latest physical therapy note 07/26/2012 patient needing 4 L of oxygen with ambulation and was total assist ambulate with a rolling walker. Requested been made for physical medicine rehabilitation consult to consider inpatient rehabilitation services  Just finished working with physical therapy. Complains of fatigue. I  spoke with the physical therapist who states the patient did not tolerate therapy very well.  Review of Systems  Respiratory: Positive for cough, sputum production and shortness of breath.   Cardiovascular: Positive for leg swelling.  Genitourinary: Positive for urgency.  Musculoskeletal: Positive for myalgias.  Neurological: Positive for weakness.  All other systems reviewed and are negative.   Past Medical History  Diagnosis Date  . COPD (chronic obstructive pulmonary disease)   . Hypertension   . Diabetes mellitus without complication   . Anemia   . Diabetes mellitus 07/17/2012  . HTN (hypertension) 07/17/2012  . Hyperlipidemia 07/17/2012  . CKD (chronic kidney disease) stage 3, GFR 30-59 ml/min 07/17/2012  . BPH (benign prostatic hypertrophy) with urinary obstruction 07/17/2012  . Calculus of kidney 07/17/2012   Past Surgical History  Procedure Laterality Date  . Cataract extraction, bilateral  Oct. 2013    bilateral eyes  . Other surgical history  33 months old    pt states "I was ruptured and had surgery."  . Eye surgery  2013    cataract bilateral extraction   History reviewed. No pertinent family history. Social History:  reports that he quit smoking about 12 years ago. He has never used smokeless tobacco. He reports that he does not drink alcohol or use illicit drugs. Allergies: No Known Allergies Medications Prior to Admission  Medication Sig Dispense Refill  . albuterol (PROVENTIL) (2.5 MG/3ML) 0.083% nebulizer solution Take 2.5 mg by nebulization every 6 (six) hours as needed. FOR SHORTNESS OF BREATH OR WHEEZING      . budesonide-formoterol (SYMBICORT) 160-4.5 MCG/ACT inhaler Inhale 2 puffs into the lungs 2 (two) times daily.      . enalapril-hydrochlorothiazide (VASERETIC) 10-25 MG per tablet Take 1 tablet by mouth every morning.      . Fluticasone-Salmeterol (ADVAIR) 500-50 MCG/DOSE AEPB Inhale 1 puff into the lungs every 12 (twelve) hours.      Marland Kitchen  glimepiride  (AMARYL) 1 MG tablet Take 1 mg by mouth daily before breakfast.      . NIFEdipine (PROCARDIA XL/ADALAT-CC) 60 MG 24 hr tablet Take 60 mg by mouth every morning.      . pirbuterol (MAXAIR) 200 MCG/INH inhaler Inhale 2 puffs into the lungs 4 (four) times daily as needed. FOR SHORTNESS OF BREATH OR WHEEZING      . Plant Sterols and Stanols (CHOLEST OFF) 450 MG TABS Take 450 mg by mouth every morning.      . Red Yeast Rice 600 MG TABS Take 1,800 mg by mouth every morning.      . Tamsulosin HCl (FLOMAX) 0.4 MG CAPS Take 0.4 mg by mouth daily after supper.        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 walks very slowly, stops fro breaks, instructed in opursed lip breathing. Gait Pattern: Step-through pattern Gait velocity: decreased General Gait Details: Pts SaO2 dropped to 77% (unclear whether or not this was acurate due to pts breathing not that labored).  Remained on 5L O2 throughout.  Stairs: No Wheelchair Mobility Wheelchair Mobility:  No  ADL: ADL Grooming: Performed;Maximal assistance;Wash/dry hands (asked wife to help him) Where Assessed - Grooming: Supported standing Toilet Transfer: Counsellor Method: Sit to Barista:  (chair, sink, chair) Transfers/Ambulation Related to ADLs: Pt continues to be anxious.  Educated on Pursed lip breathing:  02 sats dropped to 85 after coughing spell and 88% after standing at sink.  Returned to 93% on 4 liters 02.  Pt is HOH and was initially resistant to PLB but then tried a couple of times.  Pt needs extended rest breaks and must do things on his own time.   ADL Comments: Focusing on balancing activity and rest for energy conservation as pt needs encouragement to build activity tolerance.  He was willing to step towards door after resting.  Min guard for safety.  Pt asks his wife to do things--will continue to encourage him to do what he can for himself.  Cognition: Cognition Arousal/Alertness: Awake/alert Orientation Level: Oriented X4 Cognition Overall Cognitive Status: Appears within functional limits for tasks assessed/performed Area of Impairment: Safety/judgement Arousal/Alertness: Awake/alert Orientation Level: Appears intact for tasks assessed Behavior During Session: Other (comment) Following Commands: Follows one step commands inconsistently Safety/Judgement: Decreased awareness of safety precautions;Decreased safety judgement for tasks assessed Safety/Judgement - Other Comments: Pt continues to require cues for safety and hand placement.  Awareness of Deficits: pt does not appear to fully understand the ramifications of his dx until the RN explained to him that he would die if his clot moved Cognition - Other Comments: pt appears irritable about how thingas a re going here. encouraged pt. that he is iomproving. Pt. was appreciative.  Blood pressure 90/48, pulse 81, temperature 98 F (36.7 C), temperature source Oral, resp.  rate 20, height 5\' 9"  (1.753 m), weight 98.6 kg (217 lb 6 oz), SpO2 93.00%. Physical Exam  Vitals reviewed. Constitutional: He is oriented to person, place, and time.  78 year old white male in no acute distress with 4 L of oxygen in place.  HENT:  Head: Normocephalic.  Eyes:  Pupils round and reactive to light  Neck: Neck supple. No thyromegaly present.  Cardiovascular: Normal rate and regular rhythm.   Pulmonary/Chest:  Lungs with decreased breath sounds at the bases right greater than left without rhonchi  Abdominal: Soft. Bowel sounds are normal. He exhibits no distension.  Musculoskeletal:  +1 edema lower extremities  Neurological: He is alert and oriented to person, place, and time.  Follows full commands  Skin: Skin is warm and dry.  Psychiatric: He has a normal mood and affect.  Motor strength is 4/5 in bilateral deltoid, biceps, triceps, grip, hip flexors, knee extensors, ankle dorsiflexors and plantar flexors Sensation is intact to light touch in bilateral upper and lower extremities   Results for orders placed during the hospital encounter of 07/17/12 (from the past 24 hour(s))  GLUCOSE, CAPILLARY     Status: Abnormal   Collection Time    07/27/12  7:42 AM      Result Value Range   Glucose-Capillary 136 (*) 70 - 99 mg/dL  BASIC METABOLIC PANEL     Status: Abnormal   Collection Time    07/27/12  9:01 AM      Result Value Range   Sodium 136  135 - 145 mEq/L   Potassium 4.1  3.5 - 5.1 mEq/L   Chloride 97  96 - 112 mEq/L   CO2 31  19 - 32 mEq/L   Glucose, Bld 216 (*) 70 - 99 mg/dL   BUN 56 (*) 6 - 23 mg/dL   Creatinine, Ser 2.95 (*) 0.50 - 1.35 mg/dL   Calcium 8.7  8.4 - 28.4 mg/dL   GFR calc non Af Amer 22 (*) >90 mL/min   GFR calc Af Amer 26 (*) >90 mL/min  VANCOMYCIN, TROUGH     Status: Abnormal   Collection Time    07/27/12  9:02 AM      Result Value Range   Vancomycin Tr 27.5 (*) 10.0 - 20.0 ug/mL  GLUCOSE, CAPILLARY     Status: Abnormal   Collection Time     07/27/12 12:07 PM      Result Value Range   Glucose-Capillary 203 (*) 70 - 99 mg/dL  GLUCOSE, CAPILLARY     Status: Abnormal   Collection Time    07/27/12  4:49 PM      Result Value Range   Glucose-Capillary 262 (*) 70 - 99 mg/dL  VANCOMYCIN, RANDOM     Status: None   Collection Time    07/27/12  9:06 PM      Result Value Range   Vancomycin Rm 22.5    GLUCOSE, CAPILLARY     Status: Abnormal   Collection Time    07/27/12  9:42 PM      Result Value Range   Glucose-Capillary 206 (*) 70 - 99 mg/dL  CBC     Status: Abnormal   Collection Time    07/28/12  4:35 AM      Result Value Range   WBC 18.7 (*) 4.0 - 10.5 K/uL   RBC 4.16 (*) 4.22 - 5.81 MIL/uL   Hemoglobin 12.8 (*)  13.0 - 17.0 g/dL   HCT 40.9  81.1 - 91.4 %   MCV 95.0  78.0 - 100.0 fL   MCH 30.8  26.0 - 34.0 pg   MCHC 32.4  30.0 - 36.0 g/dL   RDW 78.2  95.6 - 21.3 %   Platelets 230  150 - 400 K/uL  PROTIME-INR     Status: Abnormal   Collection Time    07/28/12  4:35 AM      Result Value Range   Prothrombin Time 28.1 (*) 11.6 - 15.2 seconds   INR 2.80 (*) 0.00 - 1.49   Dg Chest Port 1 View  07/26/2012  *RADIOLOGY REPORT*  Clinical Data: Short of breath, weakness  PORTABLE CHEST - 1 VIEW  Comparison: Chest radiograph 07/23/2012  Findings: Normal cardiac silhouette.  There are bibasilar air space opacities which are similar to prior.  Upper lungs are clear.  No aggressive osseous lesions  IMPRESSION: Bibasilar opacities concerning for bibasilar pneumonia.   Original Report Authenticated By: Genevive Bi, M.D.     Assessment/Plan: Diagnosis: Deconditioning after pneumonia and exacerbation of CHF 1. Does the need for close, 24 hr/day medical supervision in concert with the patient's rehab needs make it unreasonable for this patient to be served in a less intensive setting? Potentially 2. Co-Morbidities requiring supervision/potential complications: Diabetes, hypertension, deep venous thrombosis, COPD 3. Due to bladder  management, bowel management, safety, skin/wound care, disease management, medication administration and patient education, does the patient require 24 hr/day rehab nursing? Potentially 4. Does the patient require coordinated care of a physician, rehab nurse, PT (0.5-1 hrs/day, 5 days/week) and OT (0.5-1 hrs/day, 5 days/week) to address physical and functional deficits in the context of the above medical diagnosis(es)? Potentially Addressing deficits in the following areas: balance, endurance, locomotion, strength, transferring, bowel/bladder control, bathing, dressing, feeding, grooming and toileting 5. Can the patient actively participate in an intensive therapy program of at least 3 hrs of therapy per day at least 5 days per week? No 6. The potential for patient to make measurable gains while on inpatient rehab is currently is poor  secondary to poor exercise tolerance 7. Anticipated functional outcomes upon discharge from inpatient rehab are Not applicable with PT, Not applicable with OT, Not applicable with SLP. 8. Estimated rehab length of stay to reach the above functional goals is: Not applicable 9. Does the patient have adequate social supports to accommodate these discharge functional goals? Potentially 10. Anticipated D/C setting: SNF 11. Anticipated post D/C treatments: HH therapy 12. Overall Rehab/Functional Prognosis: fair  RECOMMENDATIONS: This patient's condition is appropriate for continued rehabilitative care in the following setting: SNF Patient has agreed to participate in recommended program. Potentially Note that insurance prior authorization may be required for reimbursement for recommended care.  Comment:    07/28/2012

## 2012-07-28 NOTE — Progress Notes (Signed)
Clinical Social Work Department BRIEF PSYCHOSOCIAL ASSESSMENT 07/28/2012  Patient:  CORIE, VAVRA     Account Number:  [id number]     Admit date:  07/17/2012  Clinical Social Worker:  Hattie Perch  Date/Time:  07/28/2012 12:00 M  Referred by:  Physician  Date Referred:  07/28/2012 Referred for  SNF Placement  SNF Placement   Other Referral:   Interview type:  Patient Other interview type:    PSYCHOSOCIAL DATA Living Status:  FAMILY Admitted from facility:   Level of care:   Primary support name:  Tory Emerald Primary support relationship to patient:  SPOUSE Degree of support available:   good    CURRENT CONCERNS Current Concerns  Post-Acute Placement   Other Concerns:    SOCIAL WORK ASSESSMENT / PLAN CSW met with patient. Patient is alert and oriented X3. patient in need of snf placement. Patient agreeable to same and is agreeable to beign faxed out both in [place] and near [employer].   Assessment/plan status:   Other assessment/ plan:   Information/referral to community resources:    PATIENT'S/FAMILY'S RESPONSE TO PLAN OF CARE: agreeable to being faxed out and recieving bed offers.

## 2012-07-28 NOTE — Progress Notes (Signed)
ANTICOAGULATION CONSULT NOTE - Follow Up  Pharmacy Consult for Warfarin Indication: LLE DVT and Probable PE  No Known Allergies  Patient Measurements: Height: 5\' 9"  (175.3 cm) Weight: 217 lb 6 oz (98.6 kg) IBW/kg (Calculated) : 70.7  Labs:  Recent Labs  07/26/12 0458 07/27/12 0430 07/27/12 0901 07/28/12 0435  HGB 14.0 13.6  --  12.8*  HCT 43.3 41.9  --  39.5  PLT 213 222  --  230  LABPROT 34.0* 30.6*  --  28.1*  INR 3.62* 3.14*  --  2.80*  CREATININE 2.06*  --  2.65*  --     Estimated Creatinine Clearance: 27.9 ml/min (by C-G formula based on Cr of 2.65).  Anticoag Meds:  1/30 >> IV heparin >> 2/4 1/31 >> Warfarin PO >> 2/6 >> Lovenox >> 2/8  Assessment:  78 yo M admitted on 07/17/12 with acute resp failure, started on IV heparin for LE DVT. IV heparin stopped on 2/4 after completion of 5 day overlap with warfarin and heparin.  Lovenox started 2/6 for subtherapeutic INR in setting of acute LLE DVT and probable PE (unable to complete CT due to renal fxn; hx CKD III).  INR back in goal range today. INR has been difficult to control this admission - pt has received varying doses of 4mg , 2mg  and 1mg .  CBC ok. No further hematuria documented.  Tolerating diet ok.  Broad-spectrum abx can increase INR sensitivity.  Goal of Therapy:  Monitor platelets by anticoagulation protocol: Yes INR 2-3   Plan:   Coumadin 2mg  today   F/u INR in am.  Darrol Angel, PharmD Pager: [phone] 07/28/2012 10:21 AM

## 2012-07-28 NOTE — Progress Notes (Signed)
[facility] PROGRESS NOTE  CASSIUS CULLINANE ZOX:[id number] DOB: [birth year] DOA: 07/17/2012 PCP: Ailene Ravel, MD  Assessment/Plan: #1 acute respiratory failure  Likely multifactorial in nature secondary to probable acute PE, probable community-acquired pneumonia per patchy infiltrate seen on chest x-ray with symptoms of a productive cough that have been ongoing for several weeks- chest x ray on 2/4 showed worsening patchy infiltrate but BNP decreased- will broaden abx to vanc/zosyn and watch for improvment, in the setting of COPD. Patient looks less labored today. Patient currently 4 L nasal cannula  Sputum Gram stain and culture pending. Urine Legionella antigen negative. Urine pneumococcus antigen negative.  Continue oxygen, continue nebs. PO steroids. Chest PT. Unable to get CT angiogram secondary to chronic kidney disease. Unable to get VQ scan as patient can't lay flat. Follow. We'll also place on Ativan as needed.  Patient is tenous with high risk for intubation. Pulm signed off   #2 hypoxia  Likely multifactorial secondary to probable PE in a patient with left lower extremity DVT per Doppler ultrasound in the setting of probable community-acquired pneumonia per chest x-ray findings and symptoms. Patient was tachycardic on presentation which is improved. Sputum Gram stain and cultures pending. Urine Legionella antigen negative. Urine pneumococcus antigen negative. See above   #3 left lower extremity DVT  Patient was started on IV heparin in the ED and a such a hypercoagulable panel was unable to be obtained. Coumadin- INR (lovenox)   #4 probable PE  Patient had presented with severe hypoxia with sats in 80% requiring a face mask. Patient was tachycardic. Lower extremity Dopplers were positive for left lower extremity DVT. Unable to get CT angiogram secondary to renal function. Heparin was started in the emergency room and a such unable to obtain a hypercoagulable panel. Unable to get VQ  scan as patient could not lay flat. 2-D echo with EF of 65-70%. Right ventricle was mildly to moderately dilated. Intraventricular septum was D-shaped with evidence for right ventricular pressure/volume overload which could be consistent with a large PE.    #5 COPD exac  On presentation patient did have some wheezing. Wheezing improved but poor to fair air movement and some use of accessory muscles of respiration. PO steroids  Chest PT. Flutter valve. Patient is on mucolytics.   #6 probable community-acquired pneumoniavs bronchitis  Patient had presented with several weeks of productive cough. Chest x-ray with patchy infiltrates. Patient also did have a leukocytosis on admission. Sputum Gram stain and cultures pending. Urine Legionella antigen negative. Urine pneumococcus antigen negative. Leukocytosis is fluctuating in part also due to steriods. Broaden abx, oxygen, nebs, Robitussin as needed.   #7 acute on chronic kidney disease stage III  Her PCPs records patient's last creatinine was 1.79 on 09/28/2011. On admission creatinine was 1.99. Renal ultrasound with slight thinning of the renal cortices. No acute abnormalities. ACE inhibitor and diuretics are on hold. Renal function has worsened with lasix but breathing improved will d/c and continue to follow   #8 Hx of anemia  H&H is stable.   #9 leukocytosis  Likely reactive secondary to probable PE and probable community-acquired pneumonia in setting of steriods. Sputum Gram stain and culture pending. Urinalysis is unremarkable. Urine culture with 75,000 of enterococcus species sensitive to Levaquin. Chest x-ray with patchy infiltrates. Urine Legionella antigen negative. Urine pneumococcus antigen negative. Leukocytosis is fluctuating in part also due to steriods.   #10 diabetes mellitus  Hemoglobin A1c is 8.4. CBGs have ranged from 207-269. Increase Lantus to 10 units  daily. Continue sliding scale insulin- not compliant at home  #11  hypertension  Stable.  Follow.  Resume home meds   #12 prophylaxis  PPI for GI prophylaxis. On full dose heparin for DVT.    #13 urinary retention -foley placed   #14 hematuria - Dr. Isabel Caprice to see on Monday AM- called on call person Saturday   Code Status: Full  Family Communication: Updated patient and wife Disposition Plan: Home vs SNF   Consultants:  PC CM: Dr Sung Amabile 07/21/2012- signed off 2/4   Procedures:  Chest x-ray 07/17/2012  Lower extremity Dopplers 07/17/2012  2-D echo 07/18/2012  Renal ultrasound 07/18/2012   Antibiotics:  IV Levaquin 07/17/2012--->07/19/12  Broad spectrum abx   HPI/Subjective: Today said he finally feels as if he is feeling better Able to raise arms above head Agreeable for SNF  Objective: Filed Vitals:   07/27/12 2106 07/28/12 0116 07/28/12 0510 07/28/12 0923  BP: 128/68  90/48 109/48  Pulse: 89  81 94  Temp: 98 F (36.7 C)  98 F (36.7 C) 97.7 F (36.5 C)  TempSrc: Oral  Oral Oral  Resp: 19  20 18   Height:      Weight:   98.6 kg (217 lb 6 oz)   SpO2: 98% 95% 93% 95%    Intake/Output Summary (Last 24 hours) at 07/28/12 1209 Last data filed at 07/28/12 0802  Gross per 24 hour  Intake   1010 ml  Output   1200 ml  Net   -190 ml   Filed Weights   07/26/12 0724 07/27/12 0936 07/28/12 0510  Weight: 98.8 kg (217 lb 13 oz) 98.6 kg (217 lb 6 oz) 98.6 kg (217 lb 6 oz)    Exam:   General:  A+Ox3, NAD  Cardiovascular: rrr  Respiratory: dec BS b/l, poor effort, moving very little air  Abdomen: +BS, soft, NT  Data Reviewed: Basic Metabolic Panel:  Recent Labs Lab 07/22/12 0342 07/23/12 0445 07/26/12 0458 07/27/12 0901 07/28/12 1105  NA 132* 132* 136 136 132*  K 4.5 4.8 4.4 4.1 3.8  CL 99 99 98 97 95*  CO2 24 27 29 31 28   GLUCOSE 232* 211* 252* 216* 243*  BUN 51* 48* 48* 56* 59*  CREATININE 1.70* 1.65* 2.06* 2.65* 2.45*  CALCIUM 8.8 8.9 8.9 8.7 8.9   Liver Function Tests: No results found for this  basename: AST, ALT, ALKPHOS, BILITOT, PROT, ALBUMIN,  in the last 168 hours No results found for this basename: LIPASE, AMYLASE,  in the last 168 hours No results found for this basename: AMMONIA,  in the last 168 hours CBC:  Recent Labs Lab 07/24/12 0507 07/25/12 0420 07/26/12 0458 07/27/12 0430 07/28/12 0435  WBC 14.2* 13.9* 16.9* 17.1* 18.7*  HGB 13.5 13.9 14.0 13.6 12.8*  HCT 42.2 43.5 43.3 41.9 39.5  MCV 96.6 97.1 96.2 95.7 95.0  PLT 191 200 213 222 230   Cardiac Enzymes: No results found for this basename: CKTOTAL, CKMB, CKMBINDEX, TROPONINI,  in the last 168 hours BNP (last 3 results)  Recent Labs  07/17/12 1315 07/23/12 0445  PROBNP 1312.0* 393.8   CBG:  Recent Labs Lab 07/27/12 1207 07/27/12 1649 07/27/12 2142 07/28/12 0726 07/28/12 1152  GLUCAP 203* 262* 206* 156* 212*    No results found for this or any previous visit (from the past 240 hour(s)).   Studies: No results found.  Scheduled Meds: . albuterol  2.5 mg Nebulization Q6H  . antiseptic oral rinse  15 mL Mouth  Rinse BID  . budesonide  0.25 mg Nebulization Q6H  . fluticasone  1 spray Each Nare Daily  . furosemide  20 mg Oral Daily  . guaiFENesin  1,200 mg Oral BID  . insulin aspart  0-20 Units Subcutaneous TID WC  . insulin glargine  15 Units Subcutaneous QHS  . ipratropium  0.5 mg Nebulization Q6H  . mometasone-formoterol  2 puff Inhalation BID  . piperacillin-tazobactam (ZOSYN)  IV  3.375 g Intravenous Q8H  . [START ON 07/29/2012] predniSONE  30 mg Oral Q breakfast  . sodium chloride  3 mL Intravenous Q12H  . Tamsulosin HCl  0.4 mg Oral QPC supper  . vancomycin  1,000 mg Intravenous Q24H  . warfarin  2 mg Oral ONCE-1800  . Warfarin - Pharmacist Dosing Inpatient   Does not apply q1800   Continuous Infusions:   Principal Problem:   Acute respiratory failure Active Problems:   DVT (deep venous thrombosis)   COPD with exacerbation   Diabetes mellitus   HTN (hypertension)    Hyperlipidemia   CKD (chronic kidney disease) stage 3, GFR 30-59 ml/min   BPH (benign prostatic hypertrophy) with urinary obstruction   Calculus of kidney   PE, presumed   Bronchitis   Urinary retention with incomplete bladder emptying    Time spent: 35    [facility], Thedore Pickel  [facility] Pager [phone]. If 8PM-8AM, please contact night-coverage at [email or web address], password [facility] 07/28/2012, 12:09 PM  LOS: 11 days

## 2012-07-28 NOTE — Progress Notes (Signed)
Physical Therapy Treatment Patient Details Name: Alexander Kirby MRN: [id number] DOB: 1934-08-11 Today's Date: 07/28/2012 Time: 4098-1191 PT Time Calculation (min): 16 min  PT Assessment / Plan / Recommendation Comments on Treatment Session  Pt with short rest break upon entering room after returning to recliner from toilet with OT.  Pt then agreeable to increase activity tolerance and ambulate short distance in hallway however only able to tolerate 40 feet with rest breaks.    Follow Up Recommendations  Home health PT;CIR     Does the patient have the potential to tolerate intense rehabilitation     Barriers to Discharge        Equipment Recommendations  Rolling walker with 5" wheels    Recommendations for Other Services    Frequency     Plan Discharge plan remains appropriate;Frequency remains appropriate    Precautions / Restrictions Precautions Precautions: Fall Precaution Comments: monitor sats/HR on O2   Pertinent Vitals/Pain See below    Mobility  Bed Mobility Bed Mobility: Not assessed Transfers Transfers: Sit to Stand;Stand to Sit Sit to Stand: 4: Min guard;With upper extremity assist;From chair/3-in-1 Stand to Sit: 4: Min guard;With upper extremity assist;To chair/3-in-1 Details for Transfer Assistance: verbal cues for hand placement, able to control rise and descent well today Ambulation/Gait Ambulation/Gait Assistance: 4: Min guard Ambulation Distance (Feet): 40 Feet Assistive device: Rolling walker Ambulation/Gait Assistance Details: continues to ambulate slowly with frequent rest breaks for breathing, +2 for lines, oxygen and chair following, tends to rest with forearms on RW  Gait Pattern: Step-through pattern Gait velocity: decreased General Gait Details: SaO2 94% upon returning to room on 6L O2 , reapplied 4.5L upon sitting in recliner    Exercises     PT Diagnosis:    PT Problem List:   PT Treatment Interventions:     PT Goals Acute Rehab PT  Goals PT Goal: Sit to Stand - Progress: Progressing toward goal PT Goal: Stand to Sit - Progress: Progressing toward goal PT Goal: Ambulate - Progress: Progressing toward goal  Visit Information  Last PT Received On: 07/28/12 Assistance Needed: +2 PT/OT Co-Evaluation/Treatment: Yes    Subjective Data  Subjective: It's like chaos here.   Cognition  Cognition Overall Cognitive Status: Appears within functional limits for tasks assessed/performed Area of Impairment: Safety/judgement Arousal/Alertness: Awake/alert Orientation Level: Appears intact for tasks assessed Behavior During Session: [facility] for tasks performed Following Commands: Follows one step commands with increased time    Balance     End of Session PT - End of Session Equipment Utilized During Treatment: Oxygen Activity Tolerance: Patient limited by fatigue Patient left: in chair;with chair alarm set;Other (comment) (with student nurse) Nurse Communication: Mobility status   GP     Jerimey Burridge,KATHrine E 07/28/2012, 10:51 AM Zenovia Jarred, PT, DPT 07/28/2012 Pager: [phone]

## 2012-07-28 NOTE — Progress Notes (Signed)
Rehab admissions - Please see rehab consult done by Dr. Wynn Banker today recommending SNF.  Agree patient likely to need SNF.  Call me for questions.  #[phone]

## 2012-07-28 NOTE — Progress Notes (Signed)
Inpatient Diabetes Program Recommendations  AACE/ADA: New Consensus Statement on Inpatient Glycemic Control (2013)  Target Ranges:  Prepandial:   less than 140 mg/dL      Peak postprandial:   less than 180 mg/dL (1-2 hours)      Critically ill patients:  140 - 180 mg/dL   Reason for Visit: Hyperglycemia on Prednisone therapy  Inpatient Diabetes Program Recommendations Insulin - Meal Coverage: While on any dose of Prednisone greater than 10 mg/day, please add 3 units meal coverage tidwc.  Note: Thank you, Lenor Coffin, RN, CNS, Diabetes Coordinator [phone])

## 2012-07-28 NOTE — Progress Notes (Signed)
Occupational Therapy Treatment Patient Details Name: SYNCERE EBLE MRN: [id number] DOB: 04-Mar-1935 Today's Date: 07/28/2012 Time: 4098-1191 OT Time Calculation (min): 18 min  OT Assessment / Plan / Recommendation Comments on Treatment Session Pt continuing to progress very slowly with activity. Feel snf would be best d/c option.    Follow Up Recommendations  SNF    Barriers to Discharge       Equipment Recommendations  None recommended by OT    Recommendations for Other Services    Frequency Min 2X/week   Plan Discharge plan remains appropriate    Precautions / Restrictions Precautions Precautions: Fall Precaution Comments: monitor sats/HR on O2   Pertinent Vitals/Pain Pt denied pain    ADL  Toilet Transfer: Min guard Toilet Transfer Method: Sit to Barista: Comfort height toilet;Grab bars Toileting - Architect and Hygiene: Min guard Where Assessed - Toileting Clothing Manipulation and Hygiene: Sit to stand from 3-in-1 or toilet Transfers/Ambulation Related to ADLs: Pt required multiple extended RBs following ambulation from bathroom and ambulating in the hallway. Pt's O2 at 94L HR 111 following session on 6L of O2. Pt also does not respond well to prompting and must do things on his own time.    OT Diagnosis:    OT Problem List:   OT Treatment Interventions:     OT Goals ADL Goals ADL Goal: Toilet Transfer - Progress: Progressing toward goals Miscellaneous OT Goals OT Goal: Miscellaneous Goal #2 - Progress: Progressing toward goals OT Goal: Miscellaneous Goal #3 - Progress: Progressing toward goals  Visit Information  Last OT Received On: 07/28/12 Assistance Needed: +2 PT/OT Co-Evaluation/Treatment: Yes    Subjective Data  Subjective: Just let me rest for a minute.   Prior Functioning       Cognition  Cognition Overall Cognitive Status: Appears within functional limits for tasks assessed/performed Area of Impairment:  Safety/judgement Arousal/Alertness: Awake/alert Orientation Level: Appears intact for tasks assessed Behavior During Session: [facility] for tasks performed Following Commands: Follows one step commands with increased time    Mobility  Bed Mobility Bed Mobility: Not assessed Transfers Sit to Stand: 4: Min guard;With upper extremity assist;From chair/3-in-1 Stand to Sit: 4: Min guard;With upper extremity assist;To chair/3-in-1 Details for Transfer Assistance: verbal cues for hand placement, able to control rise and descent well today    Exercises      Balance     End of Session OT - End of Session Activity Tolerance: Patient limited by fatigue Patient left: in chair;with call bell/phone within reach  GO     Gem Conkle A OTR/L [phone] 07/28/2012, 11:18 AM

## 2012-07-28 NOTE — Consult Note (Signed)
Urology Consult  Referring physician: Marlin Canary, M.D. Reason for referral: Gross hematuria urinary retention  History of Present Illness: Alexander Kirby is 78 years of age. She's been seen in our office number of times over the years with an elevated PSA. He has had several biopsies which were negative for prostate cancer. In the past he has had very little in the way of voiding complaints. He has been managed relatively well on Flomax. The patient was recently admitted with acute respiratory failure and hypoxia. He was noted to have a DVT in probable pulmonary embolus. He has been started on anticoagulation. A Foley catheter was inserted and the patient began experiencing gross hematuria which is resolving. Hemoglobin has been stable. Currently his catheter is draining well. Urine is a light Ross collar consistent with old clots.  Past Medical History  Diagnosis Date  . COPD (chronic obstructive pulmonary disease)   . Hypertension   . Diabetes mellitus without complication   . Anemia   . Diabetes mellitus 07/17/2012  . HTN (hypertension) 07/17/2012  . Hyperlipidemia 07/17/2012  . CKD (chronic kidney disease) stage 3, GFR 30-59 ml/min 07/17/2012  . BPH (benign prostatic hypertrophy) with urinary obstruction 07/17/2012  . Calculus of kidney 07/17/2012   Past Surgical History  Procedure Laterality Date  . Cataract extraction, bilateral  Oct. 2013    bilateral eyes  . Other surgical history  60 months old    pt states "I was ruptured and had surgery."  . Eye surgery  2013    cataract bilateral extraction    Medications:  Scheduled: . albuterol  2.5 mg Nebulization Q6H  . antiseptic oral rinse  15 mL Mouth Rinse BID  . budesonide  0.25 mg Nebulization Q6H  . fluticasone  1 spray Each Nare Daily  . furosemide  20 mg Oral Daily  . guaiFENesin  1,200 mg Oral BID  . insulin aspart  0-20 Units Subcutaneous TID WC  . insulin glargine  15 Units Subcutaneous QHS  . ipratropium  0.5 mg  Nebulization Q6H  . mometasone-formoterol  2 puff Inhalation BID  . piperacillin-tazobactam (ZOSYN)  IV  3.375 g Intravenous Q8H  . [START ON 07/29/2012] predniSONE  30 mg Oral Q breakfast  . sodium chloride  3 mL Intravenous Q12H  . Tamsulosin HCl  0.4 mg Oral QPC supper  . vancomycin  1,000 mg Intravenous Q24H  . warfarin  2 mg Oral ONCE-1800  . Warfarin - Pharmacist Dosing Inpatient   Does not apply q1800    Allergies: No Known Allergies  History reviewed. No pertinent family history.  Social History:  reports that he quit smoking about 12 years ago. He has never used smokeless tobacco. He reports that he does not drink alcohol or use illicit drugs.  Positive for gross hematuria, mild penile discomfort, shortness of breath, fatigue, decreased energy/lethargy.  Physical Exam:  Vital signs in last 24 hours: Temp:  [97.7 F (36.5 C)-98 F (36.7 C)] 97.7 F (36.5 C) (02/10 0923) Pulse Rate:  [81-94] 94 (02/10 0923) Resp:  [18-20] 18 (02/10 0923) BP: (90-128)/(48-68) 109/48 mmHg (02/10 0923) SpO2:  [93 %-98 %] 95 % (02/10 0923) FiO2 (%):  [36 %] 36 % (02/10 0116) Weight:  [98.249 kg (216 lb 9.6 oz)-98.6 kg (217 lb 6 oz)] 98.249 kg (216 lb 9.6 oz) (02/10 1241)  Constitutional: Vital signs reviewed. WD WN in NAD Head: Normocephalic and atraumatic    Cardiovascular: RRR Pulmonary/Chest: Normal effort Abdominal: Soft. Non-tender, non-distended, bowel sounds are normal,  no masses, organomegaly, or guarding present.  Genitourinary: Foley catheter indwelling. Draining rust-colored urine. Otherwise no abnormalities with external genitalia appear  Neurological: Grossly non-focal.  Skin: Warm,very dry and intact. No rash, cyanosis   Laboratory Data:  Results for orders placed during the hospital encounter of 07/17/12 (from the past 72 hour(s))  GLUCOSE, CAPILLARY     Status: Abnormal   Collection Time    07/25/12  4:33 PM      Result Value Range   Glucose-Capillary 250 (*) 70 - 99  mg/dL  GLUCOSE, CAPILLARY     Status: Abnormal   Collection Time    07/25/12  9:50 PM      Result Value Range   Glucose-Capillary 347 (*) 70 - 99 mg/dL  GLUCOSE, CAPILLARY     Status: Abnormal   Collection Time    07/26/12  4:01 AM      Result Value Range   Glucose-Capillary 188 (*) 70 - 99 mg/dL  CBC     Status: Abnormal   Collection Time    07/26/12  4:58 AM      Result Value Range   WBC 16.9 (*) 4.0 - 10.5 K/uL   RBC 4.50  4.22 - 5.81 MIL/uL   Hemoglobin 14.0  13.0 - 17.0 g/dL   HCT 47.8  29.5 - 62.1 %   MCV 96.2  78.0 - 100.0 fL   MCH 31.1  26.0 - 34.0 pg   MCHC 32.3  30.0 - 36.0 g/dL   RDW 30.8  65.7 - 84.6 %   Platelets 213  150 - 400 K/uL  PROTIME-INR     Status: Abnormal   Collection Time    07/26/12  4:58 AM      Result Value Range   Prothrombin Time 34.0 (*) 11.6 - 15.2 seconds   INR 3.62 (*) 0.00 - 1.49  BASIC METABOLIC PANEL     Status: Abnormal   Collection Time    07/26/12  4:58 AM      Result Value Range   Sodium 136  135 - 145 mEq/L   Potassium 4.4  3.5 - 5.1 mEq/L   Chloride 98  96 - 112 mEq/L   CO2 29  19 - 32 mEq/L   Glucose, Bld 252 (*) 70 - 99 mg/dL   BUN 48 (*) 6 - 23 mg/dL   Creatinine, Ser 9.62 (*) 0.50 - 1.35 mg/dL   Calcium 8.9  8.4 - 95.2 mg/dL   GFR calc non Af Amer 30 (*) >90 mL/min   GFR calc Af Amer 35 (*) >90 mL/min   Comment:            The eGFR has been calculated     using the CKD EPI equation.     This calculation has not been     validated in all clinical     situations.     eGFR's persistently     <90 mL/min signify     possible Chronic Kidney Disease.  GLUCOSE, CAPILLARY     Status: Abnormal   Collection Time    07/26/12 12:17 PM      Result Value Range   Glucose-Capillary 272 (*) 70 - 99 mg/dL  GLUCOSE, CAPILLARY     Status: Abnormal   Collection Time    07/26/12  4:49 PM      Result Value Range   Glucose-Capillary 254 (*) 70 - 99 mg/dL  GLUCOSE, CAPILLARY     Status: Abnormal   Collection Time  07/26/12 10:01  PM      Result Value Range   Glucose-Capillary 135 (*) 70 - 99 mg/dL  CBC     Status: Abnormal   Collection Time    07/27/12  4:30 AM      Result Value Range   WBC 17.1 (*) 4.0 - 10.5 K/uL   RBC 4.38  4.22 - 5.81 MIL/uL   Hemoglobin 13.6  13.0 - 17.0 g/dL   HCT 16.1  09.6 - 04.5 %   MCV 95.7  78.0 - 100.0 fL   MCH 31.1  26.0 - 34.0 pg   MCHC 32.5  30.0 - 36.0 g/dL   RDW 40.9  81.1 - 91.4 %   Platelets 222  150 - 400 K/uL  PROTIME-INR     Status: Abnormal   Collection Time    07/27/12  4:30 AM      Result Value Range   Prothrombin Time 30.6 (*) 11.6 - 15.2 seconds   INR 3.14 (*) 0.00 - 1.49  GLUCOSE, CAPILLARY     Status: Abnormal   Collection Time    07/27/12  7:42 AM      Result Value Range   Glucose-Capillary 136 (*) 70 - 99 mg/dL  BASIC METABOLIC PANEL     Status: Abnormal   Collection Time    07/27/12  9:01 AM      Result Value Range   Sodium 136  135 - 145 mEq/L   Potassium 4.1  3.5 - 5.1 mEq/L   Chloride 97  96 - 112 mEq/L   CO2 31  19 - 32 mEq/L   Glucose, Bld 216 (*) 70 - 99 mg/dL   BUN 56 (*) 6 - 23 mg/dL   Creatinine, Ser 7.82 (*) 0.50 - 1.35 mg/dL   Calcium 8.7  8.4 - 95.6 mg/dL   GFR calc non Af Amer 22 (*) >90 mL/min   GFR calc Af Amer 26 (*) >90 mL/min   Comment:            The eGFR has been calculated     using the CKD EPI equation.     This calculation has not been     validated in all clinical     situations.     eGFR's persistently     <90 mL/min signify     possible Chronic Kidney Disease.  VANCOMYCIN, TROUGH     Status: Abnormal   Collection Time    07/27/12  9:02 AM      Result Value Range   Vancomycin Tr 27.5 (*) 10.0 - 20.0 ug/mL   Comment: CRITICAL RESULT CALLED TO, READ BACK BY AND VERIFIED WITH:     ERICA PELLETIER,RN [id number] @ 0940 BY J SCOTTON  GLUCOSE, CAPILLARY     Status: Abnormal   Collection Time    07/27/12 12:07 PM      Result Value Range   Glucose-Capillary 203 (*) 70 - 99 mg/dL  GLUCOSE, CAPILLARY     Status: Abnormal    Collection Time    07/27/12  4:49 PM      Result Value Range   Glucose-Capillary 262 (*) 70 - 99 mg/dL  VANCOMYCIN, RANDOM     Status: None   Collection Time    07/27/12  9:06 PM      Result Value Range   Vancomycin Rm 22.5     Comment:            Random Vancomycin therapeutic     range  is dependent on dosage and     time of specimen collection.     A peak range is 20.0-40.0 ug/mL     A trough range is 5.0-15.0 ug/mL             GLUCOSE, CAPILLARY     Status: Abnormal   Collection Time    07/27/12  9:42 PM      Result Value Range   Glucose-Capillary 206 (*) 70 - 99 mg/dL  CBC     Status: Abnormal   Collection Time    07/28/12  4:35 AM      Result Value Range   WBC 18.7 (*) 4.0 - 10.5 K/uL   RBC 4.16 (*) 4.22 - 5.81 MIL/uL   Hemoglobin 12.8 (*) 13.0 - 17.0 g/dL   HCT 16.1  09.6 - 04.5 %   MCV 95.0  78.0 - 100.0 fL   MCH 30.8  26.0 - 34.0 pg   MCHC 32.4  30.0 - 36.0 g/dL   RDW 40.9  81.1 - 91.4 %   Platelets 230  150 - 400 K/uL  PROTIME-INR     Status: Abnormal   Collection Time    07/28/12  4:35 AM      Result Value Range   Prothrombin Time 28.1 (*) 11.6 - 15.2 seconds   INR 2.80 (*) 0.00 - 1.49  GLUCOSE, CAPILLARY     Status: Abnormal   Collection Time    07/28/12  7:26 AM      Result Value Range   Glucose-Capillary 156 (*) 70 - 99 mg/dL  BASIC METABOLIC PANEL     Status: Abnormal   Collection Time    07/28/12 11:05 AM      Result Value Range   Sodium 132 (*) 135 - 145 mEq/L   Potassium 3.8  3.5 - 5.1 mEq/L   Chloride 95 (*) 96 - 112 mEq/L   CO2 28  19 - 32 mEq/L   Glucose, Bld 243 (*) 70 - 99 mg/dL   BUN 59 (*) 6 - 23 mg/dL   Creatinine, Ser 7.82 (*) 0.50 - 1.35 mg/dL   Calcium 8.9  8.4 - 95.6 mg/dL   GFR calc non Af Amer 24 (*) >90 mL/min   GFR calc Af Amer 28 (*) >90 mL/min   Comment:            The eGFR has been calculated     using the CKD EPI equation.     This calculation has not been     validated in all clinical     situations.     eGFR's  persistently     <90 mL/min signify     possible Chronic Kidney Disease.  GLUCOSE, CAPILLARY     Status: Abnormal   Collection Time    07/28/12 11:52 AM      Result Value Range   Glucose-Capillary 212 (*) 70 - 99 mg/dL   No results found for this or any previous visit (from the past 240 hour(s)). Creatinine:  Recent Labs  07/22/12 0342 07/23/12 0445 07/26/12 0458 07/27/12 0901 07/28/12 1105  CREATININE 1.70* 1.65* 2.06* 2.65* 2.45*     Impression/Assessment:  Urinary retention. This is likely due to BPH along with deconditioning. We'll leave Foley catheter indwelling currently. The patient's hematuria is probably a result of catheter trauma with anticoagulation. He will eventually need a cystoscopy with a voiding trial.  Plan:  Leave Foley catheter indwelling while patient transferred to skilled nursing facility. We'll  arrange followup in 2-3 weeks for office-based cystoscopy with voiding trial and followup.  Elohim Brune S 07/28/2012, 1:58 PM

## 2012-07-28 NOTE — Progress Notes (Signed)
ANTIBIOTIC CONSULT NOTE - FOLLOW UP  Pharmacy Consult for Vancomycin Indication: pneumonia  No Known Allergies  Patient Measurements: Height: 5\' 9"  (175.3 cm) Weight: 217 lb 6 oz (98.6 kg) IBW/kg (Calculated) : 70.7 Adjusted Body Weight:   Vital Signs: Temp: 98 F (36.7 C) (02/09 2106) Temp src: Oral (02/09 2106) BP: 128/68 mmHg (02/09 2106) Pulse Rate: 89 (02/09 2106) Intake/Output from previous day: 02/09 0701 - 02/10 0700 In: 890 [P.O.:600; I.V.:240; IV Piggyback:50] Out: 902 [Urine:900; Stool:2] Intake/Output from this shift:    Labs:  Recent Labs  07/25/12 0420 07/26/12 0458 07/27/12 0430 07/27/12 0901  WBC 13.9* 16.9* 17.1*  --   HGB 13.9 14.0 13.6  --   PLT 200 213 222  --   CREATININE  --  2.06*  --  2.65*   Estimated Creatinine Clearance: 27.9 ml/min (by C-G formula based on Cr of 2.65).  Recent Labs  07/27/12 0902 07/27/12 2106  VANCOTROUGH 27.5*  --   VANCORANDOM  --  22.5     Microbiology: Recent Results (from the past 720 hour(s))  MRSA PCR SCREENING     Status: None   Collection Time    07/17/12  6:46 PM      Result Value Range Status   MRSA by PCR NEGATIVE  NEGATIVE Final   Comment:            The GeneXpert MRSA Assay (FDA     approved for NASAL specimens     only), is one component of a     comprehensive MRSA colonization     surveillance program. It is not     intended to diagnose MRSA     infection nor to guide or     monitor treatment for     MRSA infections.  URINE CULTURE     Status: None   Collection Time    07/17/12 11:07 PM      Result Value Range Status   Specimen Description URINE, CLEAN CATCH   Final   Special Requests NONE   Final   Culture  Setup Time 07/18/2012 09:18   Final   Colony Count 75,000 COLONIES/ML   Final   Culture ENTEROCOCCUS SPECIES   Final   Report Status 07/21/2012 FINAL   Final   Organism ID, Bacteria ENTEROCOCCUS SPECIES   Final    Anti-infectives   Start     Dose/Rate Route Frequency  Ordered Stop   07/28/12 1000  vancomycin (VANCOCIN) IVPB 1000 mg/200 mL premix     1,000 mg 200 mL/hr over 60 Minutes Intravenous Every 24 hours 07/28/12 0041     07/24/12 1000  vancomycin (VANCOCIN) 1,500 mg in sodium chloride 0.9 % 500 mL IVPB     1,500 mg 250 mL/hr over 120 Minutes Intravenous Every 24 hours 07/23/12 1021 07/26/12 1158   07/23/12 1300  piperacillin-tazobactam (ZOSYN) IVPB 3.375 g     3.375 g 12.5 mL/hr over 240 Minutes Intravenous 3 times per day 07/23/12 1021     07/23/12 1100  vancomycin (VANCOCIN) 2,000 mg in sodium chloride 0.9 % 500 mL IVPB     2,000 mg 250 mL/hr over 120 Minutes Intravenous  Once 07/23/12 1021 07/23/12 1500   07/19/12 2000  levofloxacin (LEVAQUIN) tablet 750 mg  Status:  Discontinued     750 mg Oral Every 48 hours 07/19/12 1920 07/23/12 0951   07/17/12 2000  levofloxacin (LEVAQUIN) IVPB 750 mg  Status:  Discontinued     750 mg 100 mL/hr over  90 Minutes Intravenous Every 24 hours 07/17/12 1844 07/17/12 1859   07/17/12 2000  levofloxacin (LEVAQUIN) IVPB 750 mg  Status:  Discontinued     750 mg 100 mL/hr over 90 Minutes Intravenous Every 48 hours 07/17/12 1859 07/19/12 1920      Assessment: Patient with still high vancomycin level but decreasing over prior 12 hours.  Expect to be within range of goal in another 12hr.    Goal of Therapy:  Vancomycin trough level 15-20 mcg/ml  Plan:  Measure antibiotic drug levels at steady state Follow up culture results Change to vancomycin 1gm iv q24hr, next dose at 1000 2/10  Darlina Guys, [place] 07/28/2012,12:44 AM

## 2012-07-29 ENCOUNTER — Inpatient Hospital Stay (HOSPITAL_COMMUNITY): Payer: BC Managed Care – PPO

## 2012-07-29 DIAGNOSIS — I82409 Acute embolism and thrombosis of unspecified deep veins of unspecified lower extremity: Secondary | ICD-10-CM | POA: Diagnosis not present

## 2012-07-29 DIAGNOSIS — N189 Chronic kidney disease, unspecified: Secondary | ICD-10-CM | POA: Diagnosis not present

## 2012-07-29 DIAGNOSIS — N179 Acute kidney failure, unspecified: Secondary | ICD-10-CM | POA: Diagnosis not present

## 2012-07-29 DIAGNOSIS — M169 Osteoarthritis of hip, unspecified: Secondary | ICD-10-CM | POA: Diagnosis not present

## 2012-07-29 LAB — BASIC METABOLIC PANEL
Chloride: 95 mEq/L — ABNORMAL LOW (ref 96–112)
GFR calc Af Amer: 33 mL/min — ABNORMAL LOW (ref >90)
GFR calc non Af Amer: 28 mL/min — ABNORMAL LOW (ref >90)
Glucose, Bld: 194 mg/dL — ABNORMAL HIGH (ref 70–99)
Potassium: 3.8 mEq/L (ref 3.5–5.1)
Sodium: 133 mEq/L — ABNORMAL LOW (ref 135–145)

## 2012-07-29 LAB — PROTIME-INR
INR: 2.68 — ABNORMAL HIGH (ref 0.00–1.49)
Prothrombin Time: 27.2 seconds — ABNORMAL HIGH (ref 11.6–15.2)

## 2012-07-29 LAB — CBC
HCT: 38.7 % — ABNORMAL LOW (ref 39.0–52.0)
Hemoglobin: 12.7 g/dL — ABNORMAL LOW (ref 13.0–17.0)
MCHC: 32.8 g/dL (ref 30.0–36.0)
RBC: 4.11 MIL/uL — ABNORMAL LOW (ref 4.22–5.81)

## 2012-07-29 LAB — GLUCOSE, CAPILLARY
Glucose-Capillary: 163 mg/dL — ABNORMAL HIGH (ref 70–99)
Glucose-Capillary: 201 mg/dL — ABNORMAL HIGH (ref 70–99)

## 2012-07-29 MED ORDER — FUROSEMIDE 20 MG PO TABS
20.0000 mg | ORAL_TABLET | Freq: Every day | ORAL | Status: AC
  Administered 2012-07-29: 20 mg via ORAL
  Filled 2012-07-29: qty 1

## 2012-07-29 MED ORDER — INSULIN ASPART 100 UNIT/ML ~~LOC~~ SOLN
3.0000 [IU] | Freq: Three times a day (TID) | SUBCUTANEOUS | Status: DC
  Administered 2012-07-29 – 2012-08-04 (×17): 3 [IU] via SUBCUTANEOUS

## 2012-07-29 MED ORDER — FUROSEMIDE 40 MG PO TABS
40.0000 mg | ORAL_TABLET | Freq: Every day | ORAL | Status: DC
  Administered 2012-07-30 – 2012-08-04 (×6): 40 mg via ORAL
  Filled 2012-07-29 (×6): qty 1

## 2012-07-29 MED ORDER — WARFARIN SODIUM 2 MG PO TABS
2.0000 mg | ORAL_TABLET | Freq: Once | ORAL | Status: AC
  Administered 2012-07-29: 2 mg via ORAL
  Filled 2012-07-29: qty 1

## 2012-07-29 NOTE — Progress Notes (Signed)
Physical Therapy Treatment Patient Details Name: Alexander Kirby MRN: [id number] DOB: 03/02/1935 Today's Date: 07/29/2012 Time: 1191-4782 PT Time Calculation (min): 19 min  PT Assessment / Plan / Recommendation Comments on Treatment Session  Pt able to ambulate this afternoon on 2L O2 with sats remaining in 90's throughout.  RN notified and able to keep pt at 2L O2.  Declined exercises, however pt/family interested in getting HEP.     Follow Up Recommendations  Home health PT;CIR     Does the patient have the potential to tolerate intense rehabilitation     Barriers to Discharge        Equipment Recommendations  Rolling walker with 5" wheels    Recommendations for Other Services    Frequency Min 3X/week   Plan Discharge plan remains appropriate;Frequency remains appropriate    Precautions / Restrictions Precautions Precautions: Fall (Simultaneous filing. User may not have seen previous data.) Precaution Comments: monitor sats/HR on O2 (Simultaneous filing. User may not have seen previous data.) Restrictions Weight Bearing Restrictions: No (Simultaneous filing. User may not have seen previous data.)   Pertinent Vitals/Pain Pain in L hip and back.     Mobility  Bed Mobility Bed Mobility: Not assessed Transfers Transfers: Sit to Stand;Stand to Sit Sit to Stand: 4: Min guard;With upper extremity assist;From chair/3-in-1 (Simultaneous filing. User may not have seen previous data.) Stand to Sit: 4: Min guard;With upper extremity assist;To chair/3-in-1 Details for Transfer Assistance: Min/guard for safety with cues for hand placement.  Ambulation/Gait Ambulation/Gait Assistance: 4: Min guard Ambulation Distance (Feet): 82 Feet Assistive device: Rolling walker Ambulation/Gait Assistance Details: Pt requires cues for upright posture and keeping RW closer to him due to noted forward flexed posture.  Ambulated on 2L O2 with O2 sats at 92% and then increasing to 95-97% upon returning to  room.   Gait Pattern: Step-through pattern Gait velocity: decreased General Gait Details: Pt continues to require several rest breaks when ambulating and likes to bend forward on walker due to back pain and SOB.  Cues for upright posture for better breathing.  Stairs: No    Exercises     PT Diagnosis:    PT Problem List:   PT Treatment Interventions:     PT Goals Acute Rehab PT Goals PT Goal Formulation: With patient Time For Goal Achievement: 08/02/12 Potential to Achieve Goals: Fair Pt will go Sit to Stand: with modified independence PT Goal: Sit to Stand - Progress: Progressing toward goal Pt will go Stand to Sit: with modified independence PT Goal: Stand to Sit - Progress: Progressing toward goal Pt will Ambulate: with modified independence;1 - 15 feet PT Goal: Ambulate - Progress: Progressing toward goal  Visit Information  Last PT Received On: 07/29/12 Assistance Needed: +1    Subjective Data  Subjective: My hip is what is bothering me the most now.  Patient Stated Goal: home   Cognition  Cognition Overall Cognitive Status: Appears within functional limits for tasks assessed/performed (Simultaneous filing. User may not have seen previous data.) Behavior During Session: [facility] for tasks performed    Balance     End of Session PT - End of Session Equipment Utilized During Treatment: Oxygen Activity Tolerance: Patient limited by fatigue Patient left: in chair;with call bell/phone within reach;with family/visitor present Nurse Communication: Mobility status   GP     Vista Deck 07/29/2012, 4:23 PM

## 2012-07-29 NOTE — Progress Notes (Signed)
Occupational Therapy Treatment Patient Details Name: OWAIN ECKERMAN MRN: [id number] DOB: [birth year] Today's Date: 07/29/2012 Time: 9629-5284 OT Time Calculation (min): 19 min  OT Assessment / Plan / Recommendation Comments on Treatment Session Pt continues to increase activity tolerance and demonstrates pursed lip breathing.  He was able to maintain sats 92- 100% on 2 liters 02 during activity    Follow Up Recommendations  SNF (if agreeable; if not, HHOT)    Barriers to Discharge       Equipment Recommendations  None recommended by OT    Recommendations for Other Services    Frequency Min 2X/week   Plan Discharge plan remains appropriate    Precautions / Restrictions Precautions Precautions: Fall  Precaution Comments: monitor sats/HR on O2  Restrictions Weight Bearing Restrictions: No   Pertinent Vitals/Pain No pain    ADL  Toilet Transfer: Min guard Toilet Transfer Method: Sit to Barista:  (chair) Transfers/Ambulation Related to ADLs: ambulated in hall with min guard;  pt initiated rest breaks and was better about pursed lip breathing as he went on.  Cotx with PT as they came as I was starting.  Pt initially agreeable to standing at sink but wanted to walk first.  Reinforced increasing activity including standing and performing simple adls.  Pt does help with bathing:  encouraged him to continue to do this.  Decreased 02 to 2 liters and pt sats were 92 - 100%.  RN aware    OT Diagnosis:    OT Problem List:   OT Treatment Interventions:     OT Goals Acute Rehab OT Goals Time For Goal Achievement: 08/02/12 ADL Goals Pt Will Transfer to Toilet: with supervision;Ambulation;3-in-1 ADL Goal: Toilet Transfer - Progress: Progressing toward goals Miscellaneous OT Goals Miscellaneous OT Goal #2: Pt will demonstrate pursed lip breathing when dyspnea present without cues OT Goal: Miscellaneous Goal #2 - Progress: Progressing toward goals  Visit  Information  Last OT Received On: 07/29/12 Assistance Needed: +1 PT/OT Co-Evaluation/Treatment: Yes    Subjective Data      Prior Functioning       Cognition  Cognition Overall Cognitive Status: Appears within functional limits for tasks assessed/performed (Simultaneous filing. User may not have seen previous data.) Behavior During Session: [facility] for tasks performed    Mobility  Bed Mobility Bed Mobility: Not assessed Transfers Sit to Stand: 4: Min guard;With upper extremity assist;From chair/3-in-1 Stand to Sit: 4: Min guard;With upper extremity assist;To chair/3-in-1    Exercises      Balance     End of Session OT - End of Session Activity Tolerance: Patient limited by fatigue Patient left: in chair;with call bell/phone within reach  GO     Candiss Galeana 07/29/2012, 4:20 PM Marica Otter, OTR/L [phone] 07/29/2012

## 2012-07-29 NOTE — Progress Notes (Signed)
[facility] PROGRESS NOTE  Alexander Kirby ZOX:[id number] DOB: 1934/12/02 DOA: 07/17/2012 PCP: Ailene Ravel, MD  78 yo who came in with DVT and presumed PE (unable to get V/Q or CTA).  Has had a complicated course with fluid overload, acute respiratory failure, AKI with urinary retention and hematuria.  Plan is for patient to go to SNF in the next few days.   Assessment/Plan: #1 acute respiratory failure  Likely multifactorial in nature secondary to probable acute PE, probable community-acquired pneumonia per patchy infiltrate seen on chest x-ray with symptoms of a productive cough that have been ongoing for several weeks- chest x ray on 2/4 showed worsening patchy infiltrate broaden abx to vanc/zosyn and patient improved, in the setting of COPD. Patient looks less labored today. Patient currently 4 L nasal cannula  Sputum Gram stain and culture negative. Urine Legionella antigen negative. Urine pneumococcus antigen negative.  Continue oxygen, continue nebs. PO steroids. Chest PT. Unable to get CT angiogram secondary to chronic kidney disease. Unable to get VQ scan as patient can't lay flat. We'll also place on Ativan as needed.  Pulm signed off   #2 hypoxia  Likely multifactorial secondary to probable PE in a patient with left lower extremity DVT per Doppler ultrasound in the setting of probable community-acquired pneumonia per chest x-ray findings and symptoms. Patient was tachycardic on presentation which is improved. Sputum Gram stain and cultures negative. Urine Legionella antigen negative. Urine pneumococcus antigen negative. See above   #3 left lower extremity DVT  Patient was started on IV heparin in the ED and a such a hypercoagulable panel was unable to be obtained. Coumadin- INR (lovenox)   #4 probable PE  Patient had presented with severe hypoxia with sats in 80% requiring a face mask. Patient was tachycardic. Lower extremity Dopplers were positive for left lower extremity DVT.  Unable to get CT angiogram secondary to renal function. Heparin was started in the emergency room and a such unable to obtain a hypercoagulable panel. Unable to get VQ scan as patient could not lay flat. 2-D echo with EF of 65-70%. Right ventricle was mildly to moderately dilated. Intraventricular septum was D-shaped with evidence for right ventricular pressure/volume overload which could be consistent with a large PE.    #5 COPD exac  On presentation patient did have some wheezing. Wheezing improved but poor to fair air movement and some use of accessory muscles of respiration. PO steroids  Chest PT. Flutter valve. Patient is on mucolytics.   #6 probable community-acquired pneumonia vs bronchitis  Patient had presented with several weeks of productive cough. Chest x-ray with patchy infiltrates. Patient also did have a leukocytosis on admission. Sputum Gram stain and cultures pending. Urine Legionella antigen negative. Urine pneumococcus antigen negative. Leukocytosis is fluctuating in part also due to steriods. Broaden abx, oxygen, nebs, Robitussin as needed.   #7 acute on chronic kidney disease stage III  Her PCPs records patient's last creatinine was 1.79 on 09/28/2011. On admission creatinine was 1.99. Renal ultrasound with slight thinning of the renal cortices. No acute abnormalities. ACE inhibitor and diuretics are on hold. Renal function has worsened with lasix but breathing improved will d/c and continue to follow   #8 Hx of anemia  H&H is stable.   #9 leukocytosis  Likely reactive secondary to probable PE and probable community-acquired pneumonia in setting of steriods. Sputum Gram stain and culture pending. Urinalysis is unremarkable. Urine culture with 75,000 of enterococcus species sensitive to Levaquin. Chest x-ray with patchy infiltrates.  Urine Legionella antigen negative. Urine pneumococcus antigen negative. Leukocytosis is fluctuating in part also due to steriods.   #10 diabetes  mellitus  Hemoglobin A1c is 8.4. CBGs have ranged from 207-269. Increase Lantus to 10 units daily. Continue sliding scale insulin- not compliant at home - add with meal coverage  #11 hypertension  Stable.  Follow.  Resume home meds     #12 urinary retention -foley placed   #13 hematuria - Dr. Isabel Caprice saw, can be d/c'd with foley and follow up outpatient  14. Left hip pain -xray -from history appears to be chronic  Code Status: Full  Family Communication: Updated patient and wife Disposition Plan: SNF   Consultants:  PC CM: Dr Sung Amabile 07/21/2012- signed off 2/4   Procedures:  Chest x-ray 07/17/2012  Lower extremity Dopplers 07/17/2012  2-D echo 07/18/2012  Renal ultrasound 07/18/2012     HPI/Subjective: feeling better and better everyday  Objective: Filed Vitals:   07/28/12 2113 07/29/12 0128 07/29/12 0500 07/29/12 0915  BP: 114/63  97/55   Pulse: 94  91   Temp: 97.5 F (36.4 C)  97.8 F (36.6 C)   TempSrc: Oral  Oral   Resp: 18  16   Height:      Weight:   99.4 kg (219 lb 2.2 oz)   SpO2: 97% 95% 92% 100%    Intake/Output Summary (Last 24 hours) at 07/29/12 1220 Last data filed at 07/29/12 0745  Gross per 24 hour  Intake   1029 ml  Output   1552 ml  Net   -523 ml   Filed Weights   07/28/12 0510 07/28/12 1241 07/29/12 0500  Weight: 98.6 kg (217 lb 6 oz) 98.249 kg (216 lb 9.6 oz) 99.4 kg (219 lb 2.2 oz)    Exam:   General:  A+Ox3, NAD  Cardiovascular: rrr  Respiratory: dec BS b/l, poor effort, moving very little air  Abdomen: +BS, soft, NT  Data Reviewed: Basic Metabolic Panel:  Recent Labs Lab 07/23/12 0445 07/26/12 0458 07/27/12 0901 07/28/12 1105 07/29/12 0425  NA 132* 136 136 132* 133*  K 4.8 4.4 4.1 3.8 3.8  CL 99 98 97 95* 95*  CO2 27 29 31 28 28   GLUCOSE 211* 252* 216* 243* 194*  BUN 48* 48* 56* 59* 56*  CREATININE 1.65* 2.06* 2.65* 2.45* 2.16*  CALCIUM 8.9 8.9 8.7 8.9 8.9   Liver Function Tests: No results found for  this basename: AST, ALT, ALKPHOS, BILITOT, PROT, ALBUMIN,  in the last 168 hours No results found for this basename: LIPASE, AMYLASE,  in the last 168 hours No results found for this basename: AMMONIA,  in the last 168 hours CBC:  Recent Labs Lab 07/25/12 0420 07/26/12 0458 07/27/12 0430 07/28/12 0435 07/29/12 0425  WBC 13.9* 16.9* 17.1* 18.7* 19.8*  HGB 13.9 14.0 13.6 12.8* 12.7*  HCT 43.5 43.3 41.9 39.5 38.7*  MCV 97.1 96.2 95.7 95.0 94.2  PLT 200 213 222 230 226   Cardiac Enzymes: No results found for this basename: CKTOTAL, CKMB, CKMBINDEX, TROPONINI,  in the last 168 hours BNP (last 3 results)  Recent Labs  07/17/12 1315 07/23/12 0445  PROBNP 1312.0* 393.8   CBG:  Recent Labs Lab 07/28/12 0726 07/28/12 1152 07/28/12 1646 07/28/12 2109 07/29/12 0730  GLUCAP 156* 212* 118* 259* 163*    No results found for this or any previous visit (from the past 240 hour(s)).   Studies: No results found.  Scheduled Meds: . albuterol  2.5 mg Nebulization  Q6H  . antiseptic oral rinse  15 mL Mouth Rinse BID  . budesonide  0.25 mg Nebulization Q6H  . fluticasone  1 spray Each Nare Daily  . furosemide  20 mg Oral Daily  . [START ON 07/30/2012] furosemide  40 mg Oral Daily  . guaiFENesin  1,200 mg Oral BID  . insulin aspart  0-20 Units Subcutaneous TID WC  . insulin aspart  3 Units Subcutaneous TID WC  . insulin glargine  15 Units Subcutaneous QHS  . ipratropium  0.5 mg Nebulization Q6H  . mometasone-formoterol  2 puff Inhalation BID  . piperacillin-tazobactam (ZOSYN)  IV  3.375 g Intravenous Q8H  . predniSONE  30 mg Oral Q breakfast  . sodium chloride  3 mL Intravenous Q12H  . Tamsulosin HCl  0.4 mg Oral QPC supper  . vancomycin  1,000 mg Intravenous Q24H  . warfarin  2 mg Oral ONCE-1800  . Warfarin - Pharmacist Dosing Inpatient   Does not apply q1800   Continuous Infusions:   Principal Problem:   Acute respiratory failure Active Problems:   DVT (deep venous  thrombosis)   COPD with exacerbation   Diabetes mellitus   HTN (hypertension)   Hyperlipidemia   CKD (chronic kidney disease) stage 3, GFR 30-59 ml/min   BPH (benign prostatic hypertrophy) with urinary obstruction   Calculus of kidney   PE, presumed   Bronchitis   Urinary retention with incomplete bladder emptying    Time spent: 35    [facility], JESSICA  [facility] Pager [phone]. If 8PM-8AM, please contact night-coverage at [email or web address], password [facility] 07/29/2012, 12:20 PM  LOS: 12 days

## 2012-07-29 NOTE — Progress Notes (Signed)
Physical Therapy Treatment Patient Details Name: OMARE BILOTTA MRN: [id number] DOB: 03-10-1935 Today's Date: 07/29/2012 Time: 8657-8469 PT Time Calculation (min): 24 min  PT Assessment / Plan / Recommendation Comments on Treatment Session  Pt ambulated in hallway today with improved distance however still fatigues quickly but able to ambulate on 3L oxgyen.  Pt had questions about weaning from oxygen so discussed need for oxygen and inability to abruptly stop O2 [place] (as pt stated he would prefer) due to possible decline in respiratory status with PE and how it was important to slowly wean pt off oxygen and monitor SaO2/SOB/fatigue at rest and with mobility.    Follow Up Recommendations  Home health PT;CIR     Does the patient have the potential to tolerate intense rehabilitation     Barriers to Discharge        Equipment Recommendations  Rolling walker with 5" wheels    Recommendations for Other Services    Frequency     Plan Discharge plan remains appropriate;Frequency remains appropriate    Precautions / Restrictions Precautions Precautions: Fall Precaution Comments: monitor sats/HR on O2   Pertinent Vitals/Pain SaO2 93-94% on 3L O2 [place] during ambulation Pt did not report L hip pain until sitting in recliner (states xray today however no order yet upon checking end of session)    Mobility  Bed Mobility Bed Mobility: Not assessed Transfers Transfers: Sit to Stand;Stand to Sit Sit to Stand: 4: Min guard;With upper extremity assist;From chair/3-in-1 Stand to Sit: 4: Min guard;With upper extremity assist;To chair/3-in-1 Details for Transfer Assistance: verbal cues for hand placement, able to control rise and descent well today Ambulation/Gait Ambulation/Gait Assistance: 4: Min guard Ambulation Distance (Feet): 80 Feet Assistive device: Rolling walker Ambulation/Gait Assistance Details: 4 rest breaks leaning forward with forearms on RW required, ambulated on 3L O2 [place] and SaO2  93-94% on 3L Gait Pattern: Step-through pattern Gait velocity: decreased General Gait Details: pt reports still SOB but this is improving (previously couldn't talk without SOB and now able to per pt)    Exercises     PT Diagnosis:    PT Problem List:   PT Treatment Interventions:     PT Goals Acute Rehab PT Goals PT Goal: Sit to Stand - Progress: Progressing toward goal PT Goal: Stand to Sit - Progress: Progressing toward goal PT Goal: Ambulate - Progress: Progressing toward goal  Visit Information  Last PT Received On: 07/29/12 Assistance Needed: +1    Subjective Data  Subjective: I want to get off oxygen as soon as I can.   Cognition  Cognition Overall Cognitive Status: Appears within functional limits for tasks assessed/performed    Balance     End of Session PT - End of Session Equipment Utilized During Treatment: Oxygen Activity Tolerance: Patient limited by fatigue Patient left: in chair;with call bell/phone within reach;with family/visitor present   GP     Hassani Sliney,KATHrine E 07/29/2012, 12:54 PM Zenovia Jarred, PT, DPT 07/29/2012 Pager: [phone]

## 2012-07-29 NOTE — Progress Notes (Signed)
ANTICOAGULATION CONSULT NOTE - Follow Up  Pharmacy Consult for Warfarin Indication: LLE DVT and Probable PE  No Known Allergies  Patient Measurements: Height: 5\' 9"  (175.3 cm) Weight: 219 lb 2.2 oz (99.4 kg) IBW/kg (Calculated) : 70.7  Labs:  Recent Labs  07/27/12 0430 07/27/12 0901 07/28/12 0435 07/28/12 1105 07/29/12 0425  HGB 13.6  --  12.8*  --  12.7*  HCT 41.9  --  39.5  --  38.7*  PLT 222  --  230  --  226  LABPROT 30.6*  --  28.1*  --  27.2*  INR 3.14*  --  2.80*  --  2.68*  CREATININE  --  2.65*  --  2.45* 2.16*    Estimated Creatinine Clearance: 34.4 ml/min (by C-G formula based on Cr of 2.16).  Anticoag Meds:  1/30 >> IV heparin >> 2/4 1/31 >> Warfarin PO >> 2/6 >> Lovenox >> 2/8  Assessment:  78 yo M admitted on 07/17/12 with acute resp failure, started on IV heparin for LE DVT. IV heparin stopped on 2/4 after completion of 5 day overlap with warfarin and heparin.  Lovenox started 2/6 for subtherapeutic INR in setting of acute LLE DVT and probable PE (unable to complete CT due to renal fxn; hx CKD III). Stopped on 2/8.  INR remains in goal range today. INR has been difficult to control this admission - pt has received varying doses of 4mg , 2mg  and 1mg .  CBC ok. No further hematuria documented (thought to be due to foley insertion).  Tolerating diet ok.  Broad-spectrum abx can increase INR sensitivity.  Goal of Therapy:  Monitor platelets by anticoagulation protocol: Yes INR 2-3   Plan:   Coumadin 2mg  today   F/u INR in am.  Darrol Angel, PharmD Pager: [phone] 07/29/2012 8:20 AM

## 2012-07-30 ENCOUNTER — Inpatient Hospital Stay (HOSPITAL_COMMUNITY): Payer: BC Managed Care – PPO

## 2012-07-30 ENCOUNTER — Inpatient Hospital Stay: Payer: BC Managed Care – PPO

## 2012-07-30 ENCOUNTER — Inpatient Hospital Stay (HOSPITAL_COMMUNITY)
Admit: 2012-07-30 | Discharge: 2012-07-30 | Disposition: A | Discharge status: Home / Self Care | Payer: BC Managed Care – PPO | Attending: Internal Medicine | Admitting: Internal Medicine

## 2012-07-30 DIAGNOSIS — J9600 Acute respiratory failure, unspecified whether with hypoxia or hypercapnia: Secondary | ICD-10-CM | POA: Diagnosis not present

## 2012-07-30 DIAGNOSIS — R29898 Other symptoms and signs involving the musculoskeletal system: Secondary | ICD-10-CM | POA: Diagnosis not present

## 2012-07-30 DIAGNOSIS — I82409 Acute embolism and thrombosis of unspecified deep veins of unspecified lower extremity: Secondary | ICD-10-CM | POA: Diagnosis not present

## 2012-07-30 DIAGNOSIS — M47817 Spondylosis without myelopathy or radiculopathy, lumbosacral region: Secondary | ICD-10-CM | POA: Diagnosis not present

## 2012-07-30 DIAGNOSIS — M48061 Spinal stenosis, lumbar region without neurogenic claudication: Secondary | ICD-10-CM | POA: Diagnosis not present

## 2012-07-30 DIAGNOSIS — R5381 Other malaise: Secondary | ICD-10-CM | POA: Diagnosis not present

## 2012-07-30 DIAGNOSIS — N179 Acute kidney failure, unspecified: Secondary | ICD-10-CM | POA: Diagnosis not present

## 2012-07-30 LAB — BASIC METABOLIC PANEL
BUN: 48 mg/dL — ABNORMAL HIGH (ref 6–23)
Chloride: 93 mEq/L — ABNORMAL LOW (ref 96–112)
Glucose, Bld: 167 mg/dL — ABNORMAL HIGH (ref 70–99)
Potassium: 3.4 mEq/L — ABNORMAL LOW (ref 3.5–5.1)

## 2012-07-30 LAB — EXPECTORATED SPUTUM ASSESSMENT W GRAM STAIN, RFLX TO RESP C

## 2012-07-30 LAB — CBC
MCH: 31 pg (ref 26.0–34.0)
MCHC: 33.1 g/dL (ref 30.0–36.0)
Platelets: 254 10*3/uL (ref 150–400)

## 2012-07-30 LAB — PROTIME-INR
INR: 3.28 — ABNORMAL HIGH (ref 0.00–1.49)
Prothrombin Time: 31.6 seconds — ABNORMAL HIGH (ref 11.6–15.2)

## 2012-07-30 LAB — GLUCOSE, CAPILLARY: Glucose-Capillary: 215 mg/dL — ABNORMAL HIGH (ref 70–99)

## 2012-07-30 MED ORDER — LORAZEPAM 2 MG/ML IJ SOLN
2.0000 mg | Freq: Once | INTRAMUSCULAR | Status: AC
  Administered 2012-07-30: 2 mg via INTRAVENOUS
  Filled 2012-07-30: qty 1

## 2012-07-30 MED ORDER — HYDROCODONE-ACETAMINOPHEN 10-325 MG PO TABS
1.0000 | ORAL_TABLET | Freq: Once | ORAL | Status: AC
  Administered 2012-07-30: 1 via ORAL
  Filled 2012-07-30: qty 1

## 2012-07-30 NOTE — Progress Notes (Signed)
Pt unable to tolerate MRI. MD notified. New orders placed in Epic and followed out. Will continue to monitor.

## 2012-07-30 NOTE — Progress Notes (Signed)
RT notified for Pulmicort and medication was received at approximately 0925- PT is currently off 4E undergoing MRI.

## 2012-07-30 NOTE — Progress Notes (Signed)
Dr. Gonzella Lex on the floor and to come see pt. Will continue to monitor.

## 2012-07-30 NOTE — Progress Notes (Signed)
Pt had 8 beats of SVT. Pt asymptomatic. VSS. Dr. Gonzella Lex notified. Will continue to monitor.

## 2012-07-30 NOTE — Progress Notes (Signed)
Pt c/o LLE pain and numbness.pt states " It feels dead. I can't feel you touching it." Left  popliteal pluse and dorsalis pedis +1, Right popliteal pluse and dorsalis pedis +2.  Not able to palpate femoral pulse d/t pt position in chair. Pt not willing to reposition at this time d/t pain. Dr. Gonzella Lex text paged. Awaiting return call. Will continue to monitor.

## 2012-07-30 NOTE — Consult Note (Signed)
NEURO HOSPITALIST CONSULT NOTE    Reason for Consult:Left leg weakness  HPI:                                                                                                                                          Alexander Kirby is an 78 y.o. male who was initially admitted due to SOB and generalized weakness. Initial findings showed o2 saturations of 80% on room air and blood pressure 87/70 and WBC 19,400. LE dopplers were positive for DVT in posterior tibialis, popliteal and femoral veins in left LE. Patietn was placed on SQ Lovenox to bridge coumadin for DVT and probable PE (patient was unable to obtain angiogram due to renal function or VQ scan due to inability to lay flat.). Per notes intraventricular septum was D-shaped with evidence for right ventricular pressure/volume overload which could be consistent with a large PE.  In addition patient is being treated for probable CAP. Urology was also consulted for hematuria. Yesterday per PT noted patient was able to ambulate in hallway with improved distance and limited by fatigue and SOB. This AM patient complained of LLE pain with inability to move his leg except of distally.  At time of consultation patient had received 2 mg ativan for MRI which he could not tolerate. He is excessively drowsy and only responds to pain.   Past Medical History  Diagnosis Date  . COPD (chronic obstructive pulmonary disease)   . Hypertension   . Diabetes mellitus without complication   . Anemia   . Diabetes mellitus 07/17/2012  . HTN (hypertension) 07/17/2012  . Hyperlipidemia 07/17/2012  . CKD (chronic kidney disease) stage 3, GFR 30-59 ml/min 07/17/2012  . BPH (benign prostatic hypertrophy) with urinary obstruction 07/17/2012  . Calculus of kidney 07/17/2012    Past Surgical History  Procedure Laterality Date  . Cataract extraction, bilateral  Oct. 2013    bilateral eyes  . Other surgical history  24 months old    pt states "I was ruptured  and had surgery."  . Eye surgery  2013    cataract bilateral extraction    History reviewed. No pertinent family history.  Family History: Unable to obtain  Social History:  reports that he quit smoking about 12 years ago. He has never used smokeless tobacco. He reports that he does not drink alcohol or use illicit drugs.  No Known Allergies  MEDICATIONS:  Prior to Admission:  Prescriptions prior to admission  Medication Sig Dispense Refill  . albuterol (PROVENTIL) (2.5 MG/3ML) 0.083% nebulizer solution Take 2.5 mg by nebulization every 6 (six) hours as needed. FOR SHORTNESS OF BREATH OR WHEEZING      . budesonide-formoterol (SYMBICORT) 160-4.5 MCG/ACT inhaler Inhale 2 puffs into the lungs 2 (two) times daily.      . enalapril-hydrochlorothiazide (VASERETIC) 10-25 MG per tablet Take 1 tablet by mouth every morning.      . Fluticasone-Salmeterol (ADVAIR) 500-50 MCG/DOSE AEPB Inhale 1 puff into the lungs every 12 (twelve) hours.      Marland Kitchen glimepiride (AMARYL) 1 MG tablet Take 1 mg by mouth daily before breakfast.      . NIFEdipine (PROCARDIA XL/ADALAT-CC) 60 MG 24 hr tablet Take 60 mg by mouth every morning.      . pirbuterol (MAXAIR) 200 MCG/INH inhaler Inhale 2 puffs into the lungs 4 (four) times daily as needed. FOR SHORTNESS OF BREATH OR WHEEZING      . Plant Sterols and Stanols (CHOLEST OFF) 450 MG TABS Take 450 mg by mouth every morning.      . Red Yeast Rice 600 MG TABS Take 1,800 mg by mouth every morning.      . Tamsulosin HCl (FLOMAX) 0.4 MG CAPS Take 0.4 mg by mouth daily after supper.       Scheduled: . albuterol  2.5 mg Nebulization Q6H  . antiseptic oral rinse  15 mL Mouth Rinse BID  . budesonide  0.25 mg Nebulization Q6H  . fluticasone  1 spray Each Nare Daily  . furosemide  40 mg Oral Daily  . guaiFENesin  1,200 mg Oral BID  . insulin aspart  0-20 Units  Subcutaneous TID WC  . insulin aspart  3 Units Subcutaneous TID WC  . insulin glargine  15 Units Subcutaneous QHS  . ipratropium  0.5 mg Nebulization Q6H  . mometasone-formoterol  2 puff Inhalation BID  . piperacillin-tazobactam (ZOSYN)  IV  3.375 g Intravenous Q8H  . predniSONE  30 mg Oral Q breakfast  . sodium chloride  3 mL Intravenous Q12H  . Tamsulosin HCl  0.4 mg Oral QPC supper  . vancomycin  1,000 mg Intravenous Q24H  . Warfarin - Pharmacist Dosing Inpatient   Does not apply q1800     ROS:                                                                                                                                       History obtained from unobtainable from patient due to mental status  General ROS: negative for - chills, fatigue, fever, night sweats, weight gain or weight loss Psychological ROS: negative for - behavioral disorder, hallucinations, memory difficulties, mood swings or suicidal ideation Ophthalmic ROS: negative for - blurry vision, double vision, eye pain or loss of vision ENT ROS: negative for - epistaxis, nasal discharge, oral lesions, sore  throat, tinnitus or vertigo Allergy and Immunology ROS: negative for - hives or itchy/watery eyes Hematological and Lymphatic ROS: negative for - bleeding problems, bruising or swollen lymph nodes Endocrine ROS: negative for - galactorrhea, hair pattern changes, polydipsia/polyuria or temperature intolerance Respiratory ROS: shortness of breath  Cardiovascular ROS: BLE edema Gastrointestinal ROS: negative for - abdominal pain, diarrhea, hematemesis, nausea/vomiting or stool incontinence Genito-Urinary ROS: negative for - dysuria, hematuria, incontinence or urinary frequency/urgency Musculoskeletal ROS: negative for - joint swelling or muscular weakness Neurological ROS: as noted in HPI Dermatological ROS: negative for rash and skin lesion changes   Blood pressure 123/63, pulse 107, temperature 97.9 F (36.6 C),  temperature source Oral, resp. rate 22, height 5\' 9"  (1.753 m), weight 96.7 kg (213 lb 3 oz), SpO2 89.00%.   Neurologic Examination:                                                                                                      Mental Status: Excessively drowsy, only responds to pain, localizes with right hand.  Has to be constantly alerted. Cranial Nerves: II: Discs flat bilaterally; Visual fields difficulty to assess as patient will not remain awake.  He does blink to threat,  pupils equal, round, reactive to light and accommodation III,IV, VI: ptosis not present, doll's eyes intact V,VII: face symmetric, facial sensation normal bilaterally-winces to pain VIII: hearing normal bilaterally IX,X: gag reflex present XI: bilateral shoulder shrug XII: midline tongue extension Motor: Patient does not cooperate for formal testing but is able to lift all extremities off the bed.  Does not maintain.   Sensory: Responds to noxious stimuli bilaterally Deep Tendon Reflexes: 2+ and symmetric throughout UE, right KJ 2+, Left KJ -0/4 and no AJ noted bilaterally Plantars: Right: downgoing   Left: downgoing Cerebellar: Unable to assess CV: pulses palpable throughout    No results found for this basename: cbc, bmp, coags, chol, tri, ldl, hga1c    Results for orders placed during the hospital encounter of 07/17/12 (from the past 48 hour(s))  GLUCOSE, CAPILLARY     Status: Abnormal   Collection Time    07/28/12  4:46 PM      Result Value Range   Glucose-Capillary 118 (*) 70 - 99 mg/dL  GLUCOSE, CAPILLARY     Status: Abnormal   Collection Time    07/28/12  9:09 PM      Result Value Range   Glucose-Capillary 259 (*) 70 - 99 mg/dL  CBC     Status: Abnormal   Collection Time    07/29/12  4:25 AM      Result Value Range   WBC 19.8 (*) 4.0 - 10.5 K/uL   RBC 4.11 (*) 4.22 - 5.81 MIL/uL   Hemoglobin 12.7 (*) 13.0 - 17.0 g/dL   HCT 16.1 (*) 09.6 - 04.5 %   MCV 94.2  78.0 - 100.0 fL   MCH  30.9  26.0 - 34.0 pg   MCHC 32.8  30.0 - 36.0 g/dL   RDW 40.9  81.1 - 91.4 %   Platelets 226  150 -  400 K/uL  PROTIME-INR     Status: Abnormal   Collection Time    07/29/12  4:25 AM      Result Value Range   Prothrombin Time 27.2 (*) 11.6 - 15.2 seconds   INR 2.68 (*) 0.00 - 1.49  BASIC METABOLIC PANEL     Status: Abnormal   Collection Time    07/29/12  4:25 AM      Result Value Range   Sodium 133 (*) 135 - 145 mEq/L   Potassium 3.8  3.5 - 5.1 mEq/L   Chloride 95 (*) 96 - 112 mEq/L   CO2 28  19 - 32 mEq/L   Glucose, Bld 194 (*) 70 - 99 mg/dL   BUN 56 (*) 6 - 23 mg/dL   Creatinine, Ser 1.61 (*) 0.50 - 1.35 mg/dL   Calcium 8.9  8.4 - 09.6 mg/dL   GFR calc non Af Amer 28 (*) >90 mL/min   GFR calc Af Amer 33 (*) >90 mL/min   Comment:            The eGFR has been calculated     using the CKD EPI equation.     This calculation has not been     validated in all clinical     situations.     eGFR's persistently     <90 mL/min signify     possible Chronic Kidney Disease.  GLUCOSE, CAPILLARY     Status: Abnormal   Collection Time    07/29/12  7:30 AM      Result Value Range   Glucose-Capillary 163 (*) 70 - 99 mg/dL  GLUCOSE, CAPILLARY     Status: Abnormal   Collection Time    07/29/12 12:20 PM      Result Value Range   Glucose-Capillary 201 (*) 70 - 99 mg/dL  GLUCOSE, CAPILLARY     Status: Abnormal   Collection Time    07/29/12  5:02 PM      Result Value Range   Glucose-Capillary 133 (*) 70 - 99 mg/dL  GLUCOSE, CAPILLARY     Status: Abnormal   Collection Time    07/29/12  8:58 PM      Result Value Range   Glucose-Capillary 160 (*) 70 - 99 mg/dL   Comment 1 Documented in Chart     Comment 2 Notify RN    CBC     Status: Abnormal   Collection Time    07/30/12  4:43 AM      Result Value Range   WBC 19.1 (*) 4.0 - 10.5 K/uL   RBC 3.93 (*) 4.22 - 5.81 MIL/uL   Hemoglobin 12.2 (*) 13.0 - 17.0 g/dL   HCT 04.5 (*) 40.9 - 81.1 %   MCV 93.9  78.0 - 100.0 fL   MCH 31.0  26.0  - 34.0 pg   MCHC 33.1  30.0 - 36.0 g/dL   RDW 91.4  78.2 - 95.6 %   Platelets 254  150 - 400 K/uL  PROTIME-INR     Status: Abnormal   Collection Time    07/30/12  4:43 AM      Result Value Range   Prothrombin Time 31.6 (*) 11.6 - 15.2 seconds   INR 3.28 (*) 0.00 - 1.49  GLUCOSE, CAPILLARY     Status: Abnormal   Collection Time    07/30/12  7:25 AM      Result Value Range   Glucose-Capillary 129 (*) 70 - 99 mg/dL  Dg Eye Foreign Body  07/30/2012  *RADIOLOGY REPORT*  Clinical Data: Pre MRI screening  ORBITS FOR FOREIGN BODY - 2 VIEW  Comparison: None  Findings: No radiopaque foreign bodies identified within the orbits.  IMPRESSION: Negative exam for metallic foreign bodies in the orbits.   Original Report Authenticated By: Signa Kell, M.D.    Dg Hip Complete Left  07/29/2012  *RADIOLOGY REPORT*  Clinical Data: Left hip pain for 10 years.  No injury.  LEFT HIP - COMPLETE 2+ VIEW  Comparison: None.  Findings: Left hip joint degenerative changes with appearance of subchondral cysts of the left femoral head.  Radiopaque material projects over the left femoral neck region which may represent presence of loose bodies.  Mild degenerative changes lower lumbar spine.  Vascular calcifications.  Foley catheter in place.  IMPRESSION: Left hip joint degenerative changes with appearance of subchondral cysts of the left femoral head.  Radiopaque material projects over the left femoral neck region which may represent presence of loose bodies.   Original Report Authenticated By: Lacy Duverney, M.D.    Ct Lumbar Spine Wo Contrast  07/30/2012  *RADIOLOGY REPORT*  Clinical Data: Low back pain with acute two left thigh flaccidity. Unable to tolerate MRI.  CT LUMBAR SPINE WITHOUT CONTRAST  Technique:  Multidetector CT imaging of the lumbar spine was performed without intravenous contrast administration. Multiplanar CT image reconstructions were also generated.  Comparison: None.  Findings: There are five lumbar  type vertebral bodies.  The alignment is normal.  There is no evidence of fracture or pars defect. The lumbar pedicles are somewhat short on a congenital basis.  There is diffuse aorto iliac atherosclerosis.  The distal abdominal aorta demonstrates a fusiform aneurysm which is incompletely visualized, but measures up to approximately 4.0 cm anterior- posterior.  There is no evidence of retroperitoneal hematoma.  L1-L2:  Mild disc bulging with vacuum phenomenon, facet and ligamentous hypertrophy.  There is mild resulting central stenosis. The foramina are sufficiently patent.  L2-L3: There is discal vacuum phenomenon with annular bulging eccentric to the left.  No definite left L2 nerve root encroachment is seen.  Facet and ligamentous hypertrophy contributes to mild central stenosis.  L3-L4:  There is greater annular disc bulging at this level with associated vacuum phenomenon.  There is moderate facet and ligamentous hypertrophy.  These factors contribute to moderate central stenosis.  Mild inferior foraminal narrowing is present, left greater than right.  L4-L5:  Disc bulging is mildly eccentric to the right.  There is facet and ligamentous hypertrophy.  There is mild central and mild right foraminal stenosis.  L5-S1:  Disc bulging is asymmetric to the right and results in mild right foraminal stenosis.  There is mild facet and ligamentous hypertrophy.  The central canal and left foramen are patent.  IMPRESSION:  1.  Multilevel spondylosis superimposed on a congenitally small canal. There is moderate spinal stenosis at L3-L4.  Central stenosis is mild at the L1-L2, L2-L3 and L4-L5 levels. 2.  Mild foraminal and lateral recess stenosis as detailed above secondary to asymmetric disc bulging.  No high-grade foraminal stenosis or nerve root compression identified. 3.  No acute osseous findings.  4.  Distal abdominal aortic aneurysm, incompletely visualized, but measuring approximately 4 cm AP.   Original Report  Authenticated By: Carey Bullocks, M.D.    Felicie Morn PA-C [facility] [phone]  07/30/2012, 12:14 PM  Patient seen and examined.  Clinical course and management discussed.  Necessary edits performed.  I agree with  the above.  Assessment and plan of care developed and discussed below.    Assessment/Plan: 78 year old male with reported LLE weakness.  Although unable to move this morning this may very well have been related to pain.  The patient is able to move his lower extremity at this time and no focality is able to be appreciated.  Imaging of the lumbar spine has been reviewed and although there is multilevel spondylosis there is no significant cord compromise.  CT of the head has been reviewed as well and shows no evidence of hemorrhage (INR elevated).   Recommendations: 1.  No intervention recommended at this time.  Will continue to follow clinically with you and re-evaluate once more alert.        Thana Farr, MD [facility] [phone]  07/30/2012  6:09 PM

## 2012-07-30 NOTE — Progress Notes (Signed)
PT Cancellation Note  Patient Details Name: FRANCE NOYCE MRN: [id number] DOB: Feb 28, 1935   Cancelled Treatment:    Reason Eval/Treat Not Completed: Medical issues which prohibited therapy Pt just back from imaging.  RN reports he had been given Ativan before test and now confused and not safe to mobilize today.   Bellami Farrelly,KATHrine E 07/30/2012, 1:48 PM Pager: [phone]

## 2012-07-30 NOTE — Progress Notes (Signed)
[facility] PROGRESS NOTE  Alexander Kirby JXB:[id number] DOB: Oct 31, 1934 DOA: 07/17/2012 PCP: Ailene Ravel, MD   Brief narrative: 78 yo who came in with DVT and presumed PE (unable to get V/Q or CTA). Has had a complicated course with fluid overload, acute respiratory failure, AKI with urinary retention and hematuria.   Assessment/Plan:  #1 acute respiratory failure  Likely multifactorial in nature secondary to probable acute PE, probable community-acquired pneumonia per patchy infiltrate seen on chest x-ray with symptoms of a productive cough that have been ongoing for several weeks- chest x ray on 2/4 showed worsening patchy infiltrate broaden abx to vanc/zosyn and patient improved, in the setting of COPD. Patient looks less labored today. Patient currently 4 L nasal cannula.  Sputum Gram stain and culture negative. Urine Legionella antigen negative. Urine pneumococcus antigen negative. Continue oxygen, continue nebs. PO steroids. Chest PT. Unable to get CT angiogram secondary to chronic kidney disease. Unable to get VQ scan as patient can't lay flat. -ativan prn. pulm signed off   Acute left leg weakness on 2/12 Patient was walking in the hallway yesterday. C/o acute left thigh and leg numbness this am. Had weakness of left thigh . Distal pulses palpable. Stat MRI ordered but could not tolerate it. CT of lumbar spine w/o contrast shows multilevel spinal spondylosis with moderate spinal stenosis at L3-L4. Mild foraminal and lateral recess stenosis  secondary to asymmetric disc bulging. No high-grade foraminal stenosis or nerve root compression identified. Also has incidental finding of 4 cm distal abdominal aortic aneurysm.  patient knocked out with 2 mg ativan given prior to  MRI. On waking up later n the afternoon he is able to move his thigh and leg. informs numbness to be much better. Neurology consult called . awaiting recommendations. No clear explanation of acute weakness and  numbness. although there is  lateral stenosis with dis bulging at the level of L2-L4 . Symptoms much improved now. Encouraged to ambulate. Will monitor . No bowel or urinary symptoms     hypoxia  Likely multifactorial secondary to probable PE in a patient with left lower extremity DVT per Doppler ultrasound in the setting of probable community-acquired pneumonia per chest x-ray findings and symptoms. Patient was tachycardic on presentation which is improved. Sputum Gram stain and cultures negative.    left lower extremity DVT  Patient was started on IV heparin in the ED and a such a hypercoagulable panel was unable to be obtained. INR Therapeutic on coumadin   probable PE  Patient had presented with severe hypoxia with sats in 80% requiring a face mask. Patient was tachycardic. Lower extremity Dopplers were positive for left lower extremity DVT. Unable to get CT angiogram secondary to renal function. Heparin was started in the emergency room and a such unable to obtain a hypercoagulable panel. Unable to get VQ scan as patient could not lay flat. 2-D echo with EF of 65-70%. Right ventricle was mildly to moderately dilated. Intraventricular septum was D-shaped with evidence for right ventricular pressure/volume overload which could be consistent with a large PE.    COPD exac  On presentation patient did have some wheezing. Now wheezing improved but still has poor respiratory efforts. Continue nebs, po steroids     probable community-acquired pneumonia vs bronchitis  Patient had presented with several weeks of productive cough. Chest x-ray with patchy infiltrates. Patient also did have a leukocytosis on admission. Sputum Gram stain and cultures pending. Urine Legionella antigen negative. Urine pneumococcus antigen negative. Leukocytosis is  fluctuating in part also due to steriods. Continue abx, steroids and nebs   acute on chronic kidney disease stage III   On admission creatinine was 1.99. Renal  ultrasound with slight thinning of the renal cortices. No acute abnormalities. ACE inhibitor and diuretics are on hold. Renal function has worsened with lasix but breathing improved . D/c lasix and follow   Hx of anemia  H&H  stable.   leukocytosis  Likely reactive secondary to probable PE , pneumonia and being on steroids   diabetes mellitus  Hemoglobin A1c is 8.4.  Increased Lantus to 10 units daily. Continue sliding scale insulin  hypertension  Stable. Marland Kitchen Resume home meds    urinary retention  -foley placed  AAA  incidental finding on CT. 4 cm in size. Needs outpatient follow up   HPI/Subjective: C/o acute numbness over left thigh and leg with inability to move the thigh . MRI stat ordered but did not tolerate and CT of lumbar spine done. Neurology consulted.  Objective: Filed Vitals:   07/30/12 0255 07/30/12 0500 07/30/12 0834 07/30/12 1417  BP: 120/59 123/63  130/68  Pulse: 102 107  94  Temp:  97.9 F (36.6 C)  97.6 F (36.4 C)  TempSrc:  Oral  Oral  Resp:  22  20  Height:      Weight:  96.7 kg (213 lb 3 oz)    SpO2: 95% 92% 89% 95%    Intake/Output Summary (Last 24 hours) at 07/30/12 1711 Last data filed at 07/30/12 1700  Gross per 24 hour  Intake    280 ml  Output   2202 ml  Net  -1922 ml   Filed Weights   07/28/12 1241 07/29/12 0500 07/30/12 0500  Weight: 98.249 kg (216 lb 9.6 oz) 99.4 kg (219 lb 2.2 oz) 96.7 kg (213 lb 3 oz)    Exam:   General:  Elderly male in distress with LE weakness and numbness.   HEENT: using accessory muscle of respiration  Cardiovascular: NS1&S2, no murmurs  Respiratory: clear b/l, no wheezing or rhonchi  Abdomen: soft, NT, ND, BS+  EXT: warm, power 3/5 over left thigh and leg, minimal sensation voer elft leg extending from thigh to leg. DP palpable. On reevaluation later power 4+/5 over LLE. Sensation improved to baseline. Normal power and sensation over right.   CNS: AAOX3   Data Reviewed: Basic Metabolic  Panel:  Recent Labs Lab 07/26/12 0458 07/27/12 0901 07/28/12 1105 07/29/12 0425  NA 136 136 132* 133*  K 4.4 4.1 3.8 3.8  CL 98 97 95* 95*  CO2 29 31 28 28   GLUCOSE 252* 216* 243* 194*  BUN 48* 56* 59* 56*  CREATININE 2.06* 2.65* 2.45* 2.16*  CALCIUM 8.9 8.7 8.9 8.9   Liver Function Tests: No results found for this basename: AST, ALT, ALKPHOS, BILITOT, PROT, ALBUMIN,  in the last 168 hours No results found for this basename: LIPASE, AMYLASE,  in the last 168 hours No results found for this basename: AMMONIA,  in the last 168 hours CBC:  Recent Labs Lab 07/26/12 0458 07/27/12 0430 07/28/12 0435 07/29/12 0425 07/30/12 0443  WBC 16.9* 17.1* 18.7* 19.8* 19.1*  HGB 14.0 13.6 12.8* 12.7* 12.2*  HCT 43.3 41.9 39.5 38.7* 36.9*  MCV 96.2 95.7 95.0 94.2 93.9  PLT 213 222 230 226 254   Cardiac Enzymes: No results found for this basename: CKTOTAL, CKMB, CKMBINDEX, TROPONINI,  in the last 168 hours BNP (last 3 results)  Recent Labs  07/17/12  1315 07/23/12 0445  PROBNP 1312.0* 393.8   CBG:  Recent Labs Lab 07/29/12 1220 07/29/12 1702 07/29/12 2058 07/30/12 0725 07/30/12 1357  GLUCAP 201* 133* 160* 129* 215*    No results found for this or any previous visit (from the past 240 hour(s)).   Studies: Dg Eye Foreign Body  07/30/2012  *RADIOLOGY REPORT*  Clinical Data: Pre MRI screening  ORBITS FOR FOREIGN BODY - 2 VIEW  Comparison: None  Findings: No radiopaque foreign bodies identified within the orbits.  IMPRESSION: Negative exam for metallic foreign bodies in the orbits.   Original Report Authenticated By: Signa Kell, M.D.    Dg Hip Complete Left  07/29/2012  *RADIOLOGY REPORT*  Clinical Data: Left hip pain for 10 years.  No injury.  LEFT HIP - COMPLETE 2+ VIEW  Comparison: None.  Findings: Left hip joint degenerative changes with appearance of subchondral cysts of the left femoral head.  Radiopaque material projects over the left femoral neck region which may  represent presence of loose bodies.  Mild degenerative changes lower lumbar spine.  Vascular calcifications.  Foley catheter in place.  IMPRESSION: Left hip joint degenerative changes with appearance of subchondral cysts of the left femoral head.  Radiopaque material projects over the left femoral neck region which may represent presence of loose bodies.   Original Report Authenticated By: Lacy Duverney, M.D.    Ct Head Wo Contrast  07/30/2012  *RADIOLOGY REPORT*  Clinical Data: Weakness, possible stroke.  History of diabetes. History of hypertension.  History of chronic renal disease.  CT HEAD WITHOUT CONTRAST  Technique:  Contiguous axial images were obtained from the base of the skull through the vertex without contrast.  Comparison: None.  Findings: The patient had difficulty remaining motionless for the study.  Images are suboptimal.  Small or subtle lesions could be overlooked.  There is no evidence for acute infarction, intracranial hemorrhage, mass lesion, hydrocephalus, or extra-axial fluid.  Mild atrophy is present.  No visible signs of large vessel infarct.  Hypodensity of the white matter suggests chronic microvascular ischemic change. Remote left periventricular lacunar infarct.  Moderate vascular calcification in the skull base carotid and vertebral segments.  No visible fracture or worrisome osseous lesion.  Visualized sinuses and mastoids clear. Bilateral cataract extraction.  IMPRESSION: Motion degraded scan limits sensitivity.  Chronic changes as described.  No acute intracranial findings.   Original Report Authenticated By: Davonna Belling, M.D.    Ct Lumbar Spine Wo Contrast  07/30/2012  *RADIOLOGY REPORT*  Clinical Data: Low back pain with acute two left thigh flaccidity. Unable to tolerate MRI.  CT LUMBAR SPINE WITHOUT CONTRAST  Technique:  Multidetector CT imaging of the lumbar spine was performed without intravenous contrast administration. Multiplanar CT image reconstructions were also  generated.  Comparison: None.  Findings: There are five lumbar type vertebral bodies.  The alignment is normal.  There is no evidence of fracture or pars defect. The lumbar pedicles are somewhat short on a congenital basis.  There is diffuse aorto iliac atherosclerosis.  The distal abdominal aorta demonstrates a fusiform aneurysm which is incompletely visualized, but measures up to approximately 4.0 cm anterior- posterior.  There is no evidence of retroperitoneal hematoma.  L1-L2:  Mild disc bulging with vacuum phenomenon, facet and ligamentous hypertrophy.  There is mild resulting central stenosis. The foramina are sufficiently patent.  L2-L3: There is discal vacuum phenomenon with annular bulging eccentric to the left.  No definite left L2 nerve root encroachment is seen.  Facet and  ligamentous hypertrophy contributes to mild central stenosis.  L3-L4:  There is greater annular disc bulging at this level with associated vacuum phenomenon.  There is moderate facet and ligamentous hypertrophy.  These factors contribute to moderate central stenosis.  Mild inferior foraminal narrowing is present, left greater than right.  L4-L5:  Disc bulging is mildly eccentric to the right.  There is facet and ligamentous hypertrophy.  There is mild central and mild right foraminal stenosis.  L5-S1:  Disc bulging is asymmetric to the right and results in mild right foraminal stenosis.  There is mild facet and ligamentous hypertrophy.  The central canal and left foramen are patent.  IMPRESSION:  1.  Multilevel spondylosis superimposed on a congenitally small canal. There is moderate spinal stenosis at L3-L4.  Central stenosis is mild at the L1-L2, L2-L3 and L4-L5 levels. 2.  Mild foraminal and lateral recess stenosis as detailed above secondary to asymmetric disc bulging.  No high-grade foraminal stenosis or nerve root compression identified. 3.  No acute osseous findings.  4.  Distal abdominal aortic aneurysm, incompletely  visualized, but measuring approximately 4 cm AP.   Original Report Authenticated By: Carey Bullocks, M.D.     Scheduled Meds: . albuterol  2.5 mg Nebulization Q6H  . antiseptic oral rinse  15 mL Mouth Rinse BID  . budesonide  0.25 mg Nebulization Q6H  . fluticasone  1 spray Each Nare Daily  . furosemide  40 mg Oral Daily  . guaiFENesin  1,200 mg Oral BID  . insulin aspart  0-20 Units Subcutaneous TID WC  . insulin aspart  3 Units Subcutaneous TID WC  . insulin glargine  15 Units Subcutaneous QHS  . ipratropium  0.5 mg Nebulization Q6H  . mometasone-formoterol  2 puff Inhalation BID  . piperacillin-tazobactam (ZOSYN)  IV  3.375 g Intravenous Q8H  . predniSONE  30 mg Oral Q breakfast  . sodium chloride  3 mL Intravenous Q12H  . Tamsulosin HCl  0.4 mg Oral QPC supper  . vancomycin  1,000 mg Intravenous Q24H  . Warfarin - Pharmacist Dosing Inpatient   Does not apply q1800   Continuous Infusions:     Time spent: 35 minutes    Khale Nigh  [facility] Pager [phone]. If 8PM-8AM, please contact night-coverage at [email or web address], password [facility] 07/30/2012, 5:11 PM  LOS: 13 days

## 2012-07-30 NOTE — Progress Notes (Signed)
ANTICOAGULATION CONSULT NOTE - Follow Up  Pharmacy Consult for Warfarin Indication: LLE DVT and Probable PE  No Known Allergies  Patient Measurements: Height: 5\' 9"  (175.3 cm) Weight: 213 lb 3 oz (96.7 kg) IBW/kg (Calculated) : 70.7  Labs:  Recent Labs  07/28/12 0435 07/28/12 1105 07/29/12 0425 07/30/12 0443  HGB 12.8*  --  12.7* 12.2*  HCT 39.5  --  38.7* 36.9*  PLT 230  --  226 254  LABPROT 28.1*  --  27.2* 31.6*  INR 2.80*  --  2.68* 3.28*  CREATININE  --  2.45* 2.16*  --     Estimated Creatinine Clearance: 33.9 ml/min (by C-G formula based on Cr of 2.16).  Anticoag Meds:  1/30 >> IV heparin >> 2/4 1/31 >> Warfarin PO >> 2/6 >> Lovenox >> 2/8  Assessment:  78 yo M admitted on 07/17/12 with acute resp failure, started on IV heparin for LE DVT. IV heparin stopped on 2/4 after completion of 5 day overlap with warfarin and heparin.  Lovenox started 2/6 for subtherapeutic INR in setting of acute LLE DVT and probable PE (unable to complete CT due to renal fxn; hx CKD III). Stopped on 2/8.  INR bumped back up today, now supratherapeutic. INR has been difficult to control this admission - pt has received varying doses of 4mg , 2mg  and 1mg .  CBC ok. No further hematuria mentioned in chart notes (thought to be due to foley insertion).  Broad-spectrum abx can increase INR sensitivity.  Goal of Therapy:  Monitor platelets by anticoagulation protocol: Yes INR 2-3   Plan:   No warfarin tonight   F/u INR in am.  Plan to resume dosing again once Daleen Bo, PharmD Pager: [phone] 07/30/2012 10:28 AM

## 2012-07-31 DIAGNOSIS — I82409 Acute embolism and thrombosis of unspecified deep veins of unspecified lower extremity: Secondary | ICD-10-CM | POA: Diagnosis not present

## 2012-07-31 DIAGNOSIS — N179 Acute kidney failure, unspecified: Secondary | ICD-10-CM | POA: Diagnosis not present

## 2012-07-31 DIAGNOSIS — R0902 Hypoxemia: Secondary | ICD-10-CM | POA: Diagnosis not present

## 2012-07-31 DIAGNOSIS — I2699 Other pulmonary embolism without acute cor pulmonale: Secondary | ICD-10-CM | POA: Diagnosis not present

## 2012-07-31 LAB — CBC
HCT: 35.3 % — ABNORMAL LOW (ref 39.0–52.0)
Hemoglobin: 11.8 g/dL — ABNORMAL LOW (ref 13.0–17.0)
RBC: 3.77 MIL/uL — ABNORMAL LOW (ref 4.22–5.81)
WBC: 19.5 10*3/uL — ABNORMAL HIGH (ref 4.0–10.5)

## 2012-07-31 LAB — PROTIME-INR: INR: 4.12 — ABNORMAL HIGH (ref 0.00–1.49)

## 2012-07-31 MED ORDER — POTASSIUM CHLORIDE CRYS ER 20 MEQ PO TBCR
40.0000 meq | EXTENDED_RELEASE_TABLET | Freq: Once | ORAL | Status: AC
  Administered 2012-07-31: 40 meq via ORAL
  Filled 2012-07-31: qty 2

## 2012-07-31 NOTE — Clinical Social Work Note (Signed)
CSW continues to follow for SNF and reviewed chart for possible d/c. Pt appears not medically ready and has begun to receive bed offers. CSW to cont to follow for SNF.   Doreen Salvage, LCSW ICU/Stepdown Clinical Social Worker [facility] Cell [phone] Hours 8am-1200pm M-F

## 2012-07-31 NOTE — Progress Notes (Signed)
ANTIBIOTIC CONSULT NOTE - FOLLOW UP  Pharmacy Consult for Vanco and Zosyn Indication: PNA  No Known Allergies  Patient Measurements: Height: 5\' 9"  (175.3 cm) Weight: 211 lb 13.8 oz (96.1 kg) IBW/kg (Calculated) : 70.7  Vital Signs: Temp: 97.8 F (36.6 C) (02/13 0439) Temp src: Oral (02/13 0439) BP: 122/62 mmHg (02/13 0439) Pulse Rate: 89 (02/13 0439)  Labs:  Recent Labs  07/28/12 1105 07/29/12 0425 07/30/12 0443 07/30/12 1929 07/31/12 0504  WBC  --  19.8* 19.1*  --  19.5*  HGB  --  12.7* 12.2*  --  11.8*  PLT  --  226 254  --  267  CREATININE 2.45* 2.16*  --  1.86*  --    Estimated Creatinine Clearance: 39.3 ml/min (by C-G formula based on Cr of 1.86). No results found for this basename: VANCOTROUGH, Leodis Binet, VANCORANDOM, GENTTROUGH, GENTPEAK, GENTRANDOM, TOBRATROUGH, TOBRAPEAK, TOBRARND, AMIKACINPEAK, AMIKACINTROU, AMIKACIN,  in the last 72 hours   Microbiology: Recent Results (from the past 720 hour(s))  MRSA PCR SCREENING     Status: None   Collection Time    07/17/12  6:46 PM      Result Value Range Status   MRSA by PCR NEGATIVE  NEGATIVE Final   Comment:            The GeneXpert MRSA Assay (FDA     approved for NASAL specimens     only), is one component of a     comprehensive MRSA colonization     surveillance program. It is not     intended to diagnose MRSA     infection nor to guide or     monitor treatment for     MRSA infections.  URINE CULTURE     Status: None   Collection Time    07/17/12 11:07 PM      Result Value Range Status   Specimen Description URINE, CLEAN CATCH   Final   Special Requests NONE   Final   Culture  Setup Time 07/18/2012 09:18   Final   Colony Count 75,000 COLONIES/ML   Final   Culture ENTEROCOCCUS SPECIES   Final   Report Status 07/21/2012 FINAL   Final   Organism ID, Bacteria ENTEROCOCCUS SPECIES   Final  CULTURE, EXPECTORATED SPUTUM-ASSESSMENT     Status: None   Collection Time    07/30/12  9:58 PM      Result  Value Range Status   Specimen Description SPUTUM   Final   Special Requests NONE   Final   Sputum evaluation     Final   Value: THIS SPECIMEN IS ACCEPTABLE. RESPIRATORY CULTURE REPORT TO FOLLOW.   Report Status 07/30/2012 FINAL   Final   Antiobiotics: 1/30 >> Levaquin >> 2/5 2/5 >> Zosyn >> 2/5 >> Vanc >>  Assessment:  78 yo M on Day #9 Vanc and Zosyn for PNA and enterococcal UTI.   Afebrile, WBCs trending up (on prednisone).  SCr trending down (CKD-III)  Goal of Therapy:  Vancomycin trough level 15-20 mcg/ml  Plan:   No changes to current Vanco and Zosyn regimen  F/U planned duration of therapy (today is Day#9)  Will check Vanco trough if plan is to continue much longer.  Darrol Angel, PharmD Pager: [phone] 07/31/2012 9:05 AM

## 2012-07-31 NOTE — Progress Notes (Signed)
[facility] PROGRESS NOTE  Alexander Kirby UVO:[id number] DOB: 1935-04-09 DOA: 07/17/2012 PCP: Ailene Ravel, MD  Brief narrative:  78 yo who came in with DVT and presumed PE (unable to get V/Q or CTA). Has had a complicated course with fluid overload, acute respiratory failure, AKI with urinary retention and hematuria.   Assessment/Plan:  #1 acute respiratory failure  Likely multifactorial in nature secondary to probable acute PE, probable community-acquired pneumonia per patchy infiltrate seen on chest x-ray with symptoms of a productive cough that have been ongoing for several weeks- chest x ray on 2/4 showed worsening patchy infiltrate broaden abx to vanc/zosyn and patient improved, in the setting of COPD. Patient looks less labored today. Patient currently 4 L nasal cannula.  Sputum Gram stain and culture negative. Urine Legionella antigen negative. Urine pneumococcus antigen negative. Continue oxygen, continue nebs. PO steroids. Chest PT. Unable to get CT angiogram secondary to chronic kidney disease. Unable to get VQ scan as patient can't lay flat. -ativan prn.  pulm signed off   Acute left leg weakness on 2/12  . C/o acute left thigh and leg numbness this am. Had weakness of left thigh . Distal pulses palpable. Stat MRI ordered but could not tolerate it. CT of lumbar spine w/o contrast shows multilevel spinal spondylosis with moderate spinal stenosis at L3-L4. Mild foraminal and lateral recess stenosis secondary to asymmetric disc bulging. No high-grade foraminal stenosis or nerve root compression identified. Also has incidental finding of 4 cm distal abdominal aortic aneurysm.   . No bowel or urinary symptoms  Symptoms resolved slowly within few hours. Possibly related to pain and asymptomatic now.  hypoxia  Likely multifactorial secondary to probable PE in a patient with left lower extremity DVT per Doppler ultrasound in the setting of probable community-acquired pneumonia per chest  x-ray findings and symptoms. Patient was tachycardic on presentation which is improved. Sputum Gram stain and cultures negative.  sats improved and on 2L via   left lower extremity DVT  Patient was started on IV heparin in the ED and a such a hypercoagulable panel was unable to be obtained. INR supratherapeutic. coumadin dose per pharmacy  probable PE  Patient had presented with severe hypoxia with sats in 80% requiring a face mask. Patient was tachycardic. Lower extremity Dopplers were positive for left lower extremity DVT. Unable to get CT angiogram secondary to renal function. Heparin was started in the emergency room and a such unable to obtain a hypercoagulable panel. Unable to get VQ scan as patient could not lay flat. 2-D echo with EF of 65-70%. Right ventricle was mildly to moderately dilated. Intraventricular septum was D-shaped with evidence for right ventricular pressure/volume overload which could be consistent with a large PE.   COPD exac  On presentation patient did have some wheezing. Now wheezing improved but still has poor respiratory efforts. Continue nebs, po steroids   probable community-acquired pneumonia vs bronchitis  Patient had presented with several weeks of productive cough. Chest x-ray with patchy infiltrates. Patient also did have a leukocytosis on admission. Sputum Gram stain and cultures pending. Urine Legionella antigen negative. Urine pneumococcus antigen negative. Leukocytosis is fluctuating in part also due to steriods. Continue steroids and nebs  Has already received 9 days of antibiotics, will discontinue  acute on chronic kidney disease stage III  On admission creatinine was 1.99. Renal ultrasound with slight thinning of the renal cortices. No acute abnormalities. ACE inhibitor and diuretics are on hold. Renal function has worsened with lasix but  breathing improved . D/c lasix and follow BMET in am  Hx of anemia  H&H stable.   leukocytosis  Likely  reactive secondary to probable PE , pneumonia and being on steroids   diabetes mellitus  Hemoglobin A1c is 8.4. Increased Lantus to 10 units daily. Continue sliding scale insulin   hypertension  Stable. Marland Kitchen Resume home meds  urinary retention  -foley placed  AAA  incidental finding on CT. 4 cm in size. Needs outpatient follow up      HPI/Subjective: Left leg weakness and numbness resolved. breathing better  Objective: Filed Vitals:   07/31/12 0137 07/31/12 0439 07/31/12 1032 07/31/12 1419  BP:  122/62  124/66  Pulse: 75 89    Temp:  97.8 F (36.6 C)  98 F (36.7 C)  TempSrc:  Oral  Oral  Resp: 24 20  22   Height:      Weight:  96.1 kg (211 lb 13.8 oz)    SpO2: 97% 98% 97% 95%    Intake/Output Summary (Last 24 hours) at 07/31/12 1546 Last data filed at 07/31/12 1310  Gross per 24 hour  Intake    720 ml  Output   1802 ml  Net  -1082 ml   Filed Weights   07/29/12 0500 07/30/12 0500 07/31/12 0439  Weight: 99.4 kg (219 lb 2.2 oz) 96.7 kg (213 lb 3 oz) 96.1 kg (211 lb 13.8 oz)    Exam:  General: Elderly male in no acute distress HEENT: no pallor, moist mucosa Cardiovascular: NS1&S2, no murmurs  Respiratory: clear b/l, no wheezing or rhonchi  Abdomen: soft, NT, ND, BS+ foley in place EXT: warm, normal tone , power and reflexes over B/L LE. Normal sensations CNS: AAOX3    Data Reviewed: Basic Metabolic Panel:  Recent Labs Lab 07/26/12 0458 07/27/12 0901 07/28/12 1105 07/29/12 0425 07/30/12 1929  NA 136 136 132* 133* 136  K 4.4 4.1 3.8 3.8 3.4*  CL 98 97 95* 95* 93*  CO2 29 31 28 28  33*  GLUCOSE 252* 216* 243* 194* 167*  BUN 48* 56* 59* 56* 48*  CREATININE 2.06* 2.65* 2.45* 2.16* 1.86*  CALCIUM 8.9 8.7 8.9 8.9 8.9  MG  --   --   --   --  1.8   Liver Function Tests: No results found for this basename: AST, ALT, ALKPHOS, BILITOT, PROT, ALBUMIN,  in the last 168 hours No results found for this basename: LIPASE, AMYLASE,  in the last 168 hours No results  found for this basename: AMMONIA,  in the last 168 hours CBC:  Recent Labs Lab 07/27/12 0430 07/28/12 0435 07/29/12 0425 07/30/12 0443 07/31/12 0504  WBC 17.1* 18.7* 19.8* 19.1* 19.5*  HGB 13.6 12.8* 12.7* 12.2* 11.8*  HCT 41.9 39.5 38.7* 36.9* 35.3*  MCV 95.7 95.0 94.2 93.9 93.6  PLT 222 230 226 254 267   Cardiac Enzymes: No results found for this basename: CKTOTAL, CKMB, CKMBINDEX, TROPONINI,  in the last 168 hours BNP (last 3 results)  Recent Labs  07/17/12 1315 07/23/12 0445  PROBNP 1312.0* 393.8   CBG:  Recent Labs Lab 07/30/12 1357 07/30/12 1650 07/30/12 2117 07/31/12 0730 07/31/12 1119  GLUCAP 215* 178* 164* 115* 193*    Recent Results (from the past 240 hour(s))  CULTURE, EXPECTORATED SPUTUM-ASSESSMENT     Status: None   Collection Time    07/30/12  9:58 PM      Result Value Range Status   Specimen Description SPUTUM   Final   Special  Requests NONE   Final   Sputum evaluation     Final   Value: THIS SPECIMEN IS ACCEPTABLE. RESPIRATORY CULTURE REPORT TO FOLLOW.   Report Status 07/30/2012 FINAL   Final     Studies: Dg Eye Foreign Body  07/30/2012  *RADIOLOGY REPORT*  Clinical Data: Pre MRI screening  ORBITS FOR FOREIGN BODY - 2 VIEW  Comparison: None  Findings: No radiopaque foreign bodies identified within the orbits.  IMPRESSION: Negative exam for metallic foreign bodies in the orbits.   Original Report Authenticated By: Signa Kell, M.D.    Ct Head Wo Contrast  07/30/2012  *RADIOLOGY REPORT*  Clinical Data: Weakness, possible stroke.  History of diabetes. History of hypertension.  History of chronic renal disease.  CT HEAD WITHOUT CONTRAST  Technique:  Contiguous axial images were obtained from the base of the skull through the vertex without contrast.  Comparison: None.  Findings: The patient had difficulty remaining motionless for the study.  Images are suboptimal.  Small or subtle lesions could be overlooked.  There is no evidence for acute  infarction, intracranial hemorrhage, mass lesion, hydrocephalus, or extra-axial fluid.  Mild atrophy is present.  No visible signs of large vessel infarct.  Hypodensity of the white matter suggests chronic microvascular ischemic change. Remote left periventricular lacunar infarct.  Moderate vascular calcification in the skull base carotid and vertebral segments.  No visible fracture or worrisome osseous lesion.  Visualized sinuses and mastoids clear. Bilateral cataract extraction.  IMPRESSION: Motion degraded scan limits sensitivity.  Chronic changes as described.  No acute intracranial findings.   Original Report Authenticated By: Davonna Belling, M.D.    Ct Lumbar Spine Wo Contrast  07/30/2012  *RADIOLOGY REPORT*  Clinical Data: Low back pain with acute two left thigh flaccidity. Unable to tolerate MRI.  CT LUMBAR SPINE WITHOUT CONTRAST  Technique:  Multidetector CT imaging of the lumbar spine was performed without intravenous contrast administration. Multiplanar CT image reconstructions were also generated.  Comparison: None.  Findings: There are five lumbar type vertebral bodies.  The alignment is normal.  There is no evidence of fracture or pars defect. The lumbar pedicles are somewhat short on a congenital basis.  There is diffuse aorto iliac atherosclerosis.  The distal abdominal aorta demonstrates a fusiform aneurysm which is incompletely visualized, but measures up to approximately 4.0 cm anterior- posterior.  There is no evidence of retroperitoneal hematoma.  L1-L2:  Mild disc bulging with vacuum phenomenon, facet and ligamentous hypertrophy.  There is mild resulting central stenosis. The foramina are sufficiently patent.  L2-L3: There is discal vacuum phenomenon with annular bulging eccentric to the left.  No definite left L2 nerve root encroachment is seen.  Facet and ligamentous hypertrophy contributes to mild central stenosis.  L3-L4:  There is greater annular disc bulging at this level with associated  vacuum phenomenon.  There is moderate facet and ligamentous hypertrophy.  These factors contribute to moderate central stenosis.  Mild inferior foraminal narrowing is present, left greater than right.  L4-L5:  Disc bulging is mildly eccentric to the right.  There is facet and ligamentous hypertrophy.  There is mild central and mild right foraminal stenosis.  L5-S1:  Disc bulging is asymmetric to the right and results in mild right foraminal stenosis.  There is mild facet and ligamentous hypertrophy.  The central canal and left foramen are patent.  IMPRESSION:  1.  Multilevel spondylosis superimposed on a congenitally small canal. There is moderate spinal stenosis at L3-L4.  Central stenosis is mild  at the L1-L2, L2-L3 and L4-L5 levels. 2.  Mild foraminal and lateral recess stenosis as detailed above secondary to asymmetric disc bulging.  No high-grade foraminal stenosis or nerve root compression identified. 3.  No acute osseous findings.  4.  Distal abdominal aortic aneurysm, incompletely visualized, but measuring approximately 4 cm AP.   Original Report Authenticated By: Carey Bullocks, M.D.     Scheduled Meds: . albuterol  2.5 mg Nebulization Q6H  . antiseptic oral rinse  15 mL Mouth Rinse BID  . budesonide  0.25 mg Nebulization Q6H  . fluticasone  1 spray Each Nare Daily  . furosemide  40 mg Oral Daily  . guaiFENesin  1,200 mg Oral BID  . insulin aspart  0-20 Units Subcutaneous TID WC  . insulin aspart  3 Units Subcutaneous TID WC  . insulin glargine  15 Units Subcutaneous QHS  . ipratropium  0.5 mg Nebulization Q6H  . mometasone-formoterol  2 puff Inhalation BID  . predniSONE  30 mg Oral Q breakfast  . sodium chloride  3 mL Intravenous Q12H  . Tamsulosin HCl  0.4 mg Oral QPC supper  . Warfarin - Pharmacist Dosing Inpatient   Does not apply q1800   Continuous Infusions:     Time spent: 25 minutes    Katelynn Heidler  [facility] Pager [phone]. If 8PM-8AM, please contact  night-coverage at [email or web address], password [facility] 07/31/2012, 3:46 PM  LOS: 14 days

## 2012-07-31 NOTE — Progress Notes (Signed)
ANTICOAGULATION CONSULT NOTE - Follow Up  Pharmacy Consult for Warfarin Indication: LLE DVT and Probable PE  No Known Allergies  Patient Measurements: Height: 5\' 9"  (175.3 cm) Weight: 211 lb 13.8 oz (96.1 kg) IBW/kg (Calculated) : 70.7  Labs:  Recent Labs  07/28/12 1105  07/29/12 0425 07/30/12 0443 07/30/12 1929 07/31/12 0504  HGB  --   < > 12.7* 12.2*  --  11.8*  HCT  --   --  38.7* 36.9*  --  35.3*  PLT  --   --  226 254  --  267  LABPROT  --   --  27.2* 31.6*  --  37.4*  INR  --   --  2.68* 3.28*  --  4.12*  CREATININE 2.45*  --  2.16*  --  1.86*  --   < > = values in this interval not displayed.  Estimated Creatinine Clearance: 39.3 ml/min (by C-G formula based on Cr of 1.86).  Anticoag Meds:  1/30 >> IV heparin >> 2/4 1/31 >> Warfarin PO >> 2/6 >> Lovenox >> 2/8  Assessment:  78 yo M admitted on 07/17/12 with acute resp failure, started on IV heparin for LE DVT. IV heparin stopped on 2/4 after completion of 5 day overlap with warfarin and heparin.  Lovenox started 2/6 for subtherapeutic INR in setting of acute LLE DVT and probable PE (unable to complete CT due to renal fxn; hx CKD III). Stopped on 2/8.  INR continues to trend up, remains supratherapeutic. INR has been difficult to control this admission, extremely labile - pt has received varying doses of 4mg , 2mg  and 1mg .  H/H very slowly trending down. No further hematuria mentioned in chart notes (thought to be due to foley insertion).  Broad-spectrum abx can increase INR sensitivity.  Goal of Therapy:  Monitor platelets by anticoagulation protocol: Yes INR 2-3   Plan:   No warfarin tonight   F/u INR in am.  Plan to resume dosing again once Daleen Bo, PharmD Pager: [phone] 07/31/2012 8:50 AM

## 2012-08-01 DIAGNOSIS — I82409 Acute embolism and thrombosis of unspecified deep veins of unspecified lower extremity: Secondary | ICD-10-CM | POA: Diagnosis not present

## 2012-08-01 DIAGNOSIS — J441 Chronic obstructive pulmonary disease with (acute) exacerbation: Secondary | ICD-10-CM | POA: Diagnosis not present

## 2012-08-01 DIAGNOSIS — E876 Hypokalemia: Secondary | ICD-10-CM | POA: Diagnosis not present

## 2012-08-01 DIAGNOSIS — I2699 Other pulmonary embolism without acute cor pulmonale: Secondary | ICD-10-CM | POA: Diagnosis not present

## 2012-08-01 LAB — BASIC METABOLIC PANEL
BUN: 41 mg/dL — ABNORMAL HIGH (ref 6–23)
CO2: 30 mEq/L (ref 19–32)
Chloride: 94 mEq/L — ABNORMAL LOW (ref 96–112)
Creatinine, Ser: 1.7 mg/dL — ABNORMAL HIGH (ref 0.50–1.35)

## 2012-08-01 LAB — CBC
MCH: 31.1 pg (ref 26.0–34.0)
MCV: 94.1 fL (ref 78.0–100.0)
Platelets: 237 10*3/uL (ref 150–400)
RDW: 13.6 % (ref 11.5–15.5)

## 2012-08-01 LAB — GLUCOSE, CAPILLARY
Glucose-Capillary: 122 mg/dL — ABNORMAL HIGH (ref 70–99)
Glucose-Capillary: 265 mg/dL — ABNORMAL HIGH (ref 70–99)

## 2012-08-01 MED ORDER — WARFARIN SODIUM 1 MG PO TABS
1.0000 mg | ORAL_TABLET | Freq: Once | ORAL | Status: AC
  Administered 2012-08-01: 1 mg via ORAL
  Filled 2012-08-01: qty 1

## 2012-08-01 MED ORDER — POTASSIUM CHLORIDE CRYS ER 20 MEQ PO TBCR
40.0000 meq | EXTENDED_RELEASE_TABLET | Freq: Once | ORAL | Status: AC
  Administered 2012-08-01: 40 meq via ORAL
  Filled 2012-08-01: qty 2

## 2012-08-01 NOTE — Progress Notes (Signed)
Per RNCM, Pt prefers [facility].  Providence Crosby, LCSWA Clinical Social Work [phone]

## 2012-08-01 NOTE — Progress Notes (Signed)
Physical Therapy Treatment Patient Details Name: Alexander Kirby MRN: [id number] DOB: 1934-09-19 Today's Date: 08/01/2012 Time: 6213-0865 PT Time Calculation (min): 23 min  PT Assessment / Plan / Recommendation Comments on Treatment Session  Amb pt on 2 lts nasal.  Requires freq standing rest breaks.  Spouse assisted by following with recliner.    Follow Up Recommendations  Home health PT;CIR     Does the patient have the potential to tolerate intense rehabilitation     Barriers to Discharge        Equipment Recommendations  Rolling walker with 5" wheels    Recommendations for Other Services OT consult  Frequency Min 3X/week   Plan Discharge plan remains appropriate;Frequency remains appropriate    Precautions / Restrictions Precautions Precautions: Fall Precaution Comments: monitor sats/HR on O2 Restrictions Weight Bearing Restrictions: No   Pertinent Vitals/Pain C/o L hip pain which he states is arthritis.    Mobility  Bed Mobility Bed Mobility: Not assessed Details for Bed Mobility Assistance: Pt OOb in recliner Transfers Transfers: Sit to Stand;Stand to Sit Sit to Stand: 4: Min guard;With upper extremity assist;From chair/3-in-1 Stand to Sit: 4: Min guard;With upper extremity assist;To chair/3-in-1 Details for Transfer Assistance: Min/guard for safety with cues for hand placement.  Ambulation/Gait Ambulation/Gait Assistance: 4: Min guard Ambulation Distance (Feet): 123 Feet Assistive device: Rolling walker Ambulation/Gait Assistance Details: 25% VC's on purse lip breathing and upright posture.  Pt demon limited activity tolerance and requires freq rest breaks due to dyspnea.  Amb on 2 lts nasal sats ranged from 81 - 91%. Spouse assisted by following with recliner. Gait Pattern: Step-through pattern Gait velocity: decreased General Gait Details: Pt continues to require several rest breaks when ambulating and likes to bend forward on walker due to back pain and SOB.   Cues for upright posture for better breathing.        Visit Information  Last PT Received On: 08/01/12 Assistance Needed: +2 (to amb due to equipment and chair to follow)    Subjective Data      Cognition       Balance   fair  End of Session PT - End of Session Equipment Utilized During Treatment: Oxygen Activity Tolerance: Patient limited by fatigue Patient left: in chair;with call bell/phone within reach;with family/visitor present   Felecia Shelling  PTA [facility]  Acute  Rehab Pager      [phone]

## 2012-08-01 NOTE — Progress Notes (Signed)
Inpatient Diabetes Program Recommendations  AACE/ADA: New Consensus Statement on Inpatient Glycemic Control (2013)  Target Ranges:  Prepandial:   less than 140 mg/dL      Peak postprandial:   less than 180 mg/dL (1-2 hours)      Critically ill patients:  140 - 180 mg/dL    Results for Alexander Kirby, Alexander Kirby (MRN [id number]) as of 08/01/2012 13:28  Ref. Range 07/31/2012 07:30 07/31/2012 11:19 07/31/2012 17:01 07/31/2012 21:16  Glucose-Capillary Latest Range: 70-99 mg/dL 469 (H) 629 (H) 528 (H) 100 (H)   Results for Alexander Kirby, Alexander Kirby (MRN [id number]) as of 08/01/2012 13:28  Ref. Range 08/01/2012 07:30 08/01/2012 12:17  Glucose-Capillary Latest Range: 70-99 mg/dL 272 (H) 536 (H)   Patient having blood glucose elevations at lunch time and afterwards.  May want to consider increasing meal coverage slightly.  Inpatient Diabetes Program Recommendations Insulin - Meal Coverage: Please consider increasing Novolog meal coverage to 4 units tid with meals currently ordered as 3 units tid with meals).  Note: Will follow. Ambrose Finland RN, MSN, CDE Diabetes Coordinator Inpatient Diabetes Program [phone]

## 2012-08-01 NOTE — Progress Notes (Signed)
ANTICOAGULATION CONSULT NOTE - Follow Up  Pharmacy Consult for Warfarin Indication: LLE DVT and Probable PE  No Known Allergies  Patient Measurements: Height: 5\' 9"  (175.3 cm) Weight: 211 lb 6.7 oz (95.9 kg) IBW/kg (Calculated) : 70.7  Labs:  Recent Labs  07/30/12 0443 07/30/12 1929 07/31/12 0504 08/01/12 0430  HGB 12.2*  --  11.8* 11.0*  HCT 36.9*  --  35.3* 33.3*  PLT 254  --  267 237  LABPROT 31.6*  --  37.4* 27.9*  INR 3.28*  --  4.12* 2.77*  CREATININE  --  1.86*  --  1.70*    Estimated Creatinine Clearance: 42.9 ml/min (by C-G formula based on Cr of 1.7).  Anticoag Meds:  1/30 >> IV heparin >> 2/4 1/31 >> Warfarin PO >> 2/6 >> Lovenox >> 2/8  Assessment:  78 yo M admitted on 07/17/12 with acute resp failure, started on IV heparin for LE DVT. IV heparin stopped on 2/4 after completion of 5 day overlap with warfarin and heparin.  Lovenox started 2/6 for subtherapeutic INR in setting of acute LLE DVT and probable PE (unable to complete CT due to renal fxn; hx CKD III). Stopped on 2/8.  INR back in goal range today after holding 2/12 and 2/13 doses. INR has been difficult to control this admission, extremely labile - pt has received varying doses of 4mg , 2mg  and 1mg .  H/H very slowly trending down. No further hematuria mentioned in chart notes (thought to be due to foley insertion).  Goal of Therapy:  Monitor platelets by anticoagulation protocol: Yes INR 2-3   Plan:   Warfarin 1mg  PO x1 tonight  F/u INR in am.  If pt is discharged today, suggest sending patient home on warfarin 1mg  PO daily, with next INR check on Monday 2/17.  Darrol Angel, PharmD Pager: [phone] 08/01/2012 12:02 PM

## 2012-08-01 NOTE — Progress Notes (Signed)
[facility] PROGRESS NOTE  Alexander Kirby VWU:[id number] DOB: 1934/09/21 DOA: 07/17/2012 PCP: Ailene Ravel, MD  Brief narrative:  79 yo who came in with DVT and presumed PE (unable to get V/Q or CTA). Has had a complicated course with fluid overload, acute respiratory failure, AKI with urinary retention and hematuria.   Assessment/Plan:  #1 acute respiratory failure  Likely multifactorial in nature secondary to probable acute PE, probable community-acquired pneumonia per patchy infiltrate seen on chest x-ray with symptoms of a productive cough that have been ongoing for several weeks- chest x ray on 2/4 showed worsening patchy infiltrate broaden abx to vanc/zosyn and patient improved, in the setting of COPD. Patient looks less labored today. Patient currently 4 L nasal cannula.  Sputum Gram stain and culture negative. Urine Legionella antigen negative. Urine pneumococcus antigen negative. Continue oxygen, continue nebs. PO steroids. Chest PT. Unable to get CT angiogram secondary to chronic kidney disease. Unable to get VQ scan as patient can't lay flat.  pulm signed off   Acute left leg weakness on 2/12   C/o acute left thigh and leg numbness with  weakness of left thigh . Distal pulses palpable. Stat MRI ordered but could not tolerate it. CT of lumbar spine w/o contrast shows multilevel spinal spondylosis with moderate spinal stenosis at L3-L4. Mild foraminal and lateral recess stenosis secondary to asymmetric disc bulging. No high-grade foraminal stenosis or nerve root compression identified. Also has incidental finding of 4 cm distal abdominal aortic aneurysm.  . No bowel or urinary symptoms  Symptoms resolved slowly within few hours. Possibly related to pain and asymptomatic now.   hypoxia  Likely multifactorial secondary to probable PE in a patient with left lower extremity DVT per Doppler ultrasound in the setting of probable community-acquired pneumonia per chest x-ray findings and  symptoms. Patient was tachycardic on presentation which is improved. Sputum Gram stain and cultures negative.  sats improved and stable on RA but drops to 80s on ambulation.  left lower extremity DVT  Patient was started on IV heparin in the ED and a such a hypercoagulable panel was unable to be obtained. INR therapeutic. coumadin dose per pharmacy   probable PE  Patient had presented with severe hypoxia with sats in 80% requiring a face mask. Patient was tachycardic. Lower extremity Dopplers were positive for left lower extremity DVT. Unable to get CT angiogram secondary to renal function. Heparin was started in the emergency room and a such unable to obtain a hypercoagulable panel. Unable to get VQ scan as patient could not lay flat. 2-D echo with EF of 65-70%. Right ventricle was mildly to moderately dilated. Intraventricular septum was D-shaped with evidence for right ventricular pressure/volume overload which could be consistent with a large PE.   COPD exac  On presentation patient did have some wheezing. Now wheezing improved but still has poor respiratory efforts. Continue nebs, po steroids   probable community-acquired pneumonia vs bronchitis  Patient had presented with several weeks of productive cough. Chest x-ray with patchy infiltrates. Patient also did have a leukocytosis on admission. Sputum Gram stain and cultures pending. Urine Legionella antigen negative. Urine pneumococcus antigen negative. Leukocytosis is fluctuating in part also due to steriods. Continue steroids and nebs  -treated with a course of vanco and zosyn ( total 9 days)  acute on chronic kidney disease stage III  On admission creatinine was 1.99. Renal ultrasound with slight thinning of the renal cortices. No acute abnormalities. ACE inhibitor and diuretics are on hold. Renal  function worsened with lasix but breathing improved . On po lasix. creatinine better.   Hx of anemia  H&H stable.   leukocytosis  Likely  reactive secondary to probable PE , pneumonia and being on steroids   diabetes mellitus  Hemoglobin A1c is 8.4. Increased Lantus to 10 units daily. Continue sliding scale insulin   hypertension  Stable. Marland Kitchen Resume home meds   urinary retention  -foley placed   AAA  incidental finding on CT with 4 cm in size. Needs outpatient follow up     HPI/Subjective: Feels better today. No overnight issues  Objective: Filed Vitals:   08/01/12 0450 08/01/12 0700 08/01/12 0701 08/01/12 0745  BP: 138/71     Pulse: 88     Temp: 97.9 F (36.6 C)     TempSrc: Oral     Resp: 22     Height:      Weight: 95.9 kg (211 lb 6.7 oz)     SpO2: 94% 78% 95% 95%    Intake/Output Summary (Last 24 hours) at 08/01/12 1234 Last data filed at 08/01/12 0700  Gross per 24 hour  Intake    720 ml  Output   1376 ml  Net   -656 ml   Filed Weights   07/30/12 0500 07/31/12 0439 08/01/12 0450  Weight: 96.7 kg (213 lb 3 oz) 96.1 kg (211 lb 13.8 oz) 95.9 kg (211 lb 6.7 oz)    Exam:  General: Elderly male in no acute distress  HEENT: no pallor, moist mucosa  Cardiovascular: NS1&S2, no murmurs  Respiratory: clear b/l, no wheezing or rhonchi  Abdomen: soft, NT, ND, BS+ foley in place  EXT: warm, normal tone , power and reflexes over B/L LE. Normal sensations  CNS: AAOX3    Data Reviewed: Basic Metabolic Panel:  Recent Labs Lab 07/27/12 0901 07/28/12 1105 07/29/12 0425 07/30/12 1929 08/01/12 0430  NA 136 132* 133* 136 134*  K 4.1 3.8 3.8 3.4* 3.2*  CL 97 95* 95* 93* 94*  CO2 31 28 28  33* 30  GLUCOSE 216* 243* 194* 167* 107*  BUN 56* 59* 56* 48* 41*  CREATININE 2.65* 2.45* 2.16* 1.86* 1.70*  CALCIUM 8.7 8.9 8.9 8.9 8.4  MG  --   --   --  1.8  --    Liver Function Tests: No results found for this basename: AST, ALT, ALKPHOS, BILITOT, PROT, ALBUMIN,  in the last 168 hours No results found for this basename: LIPASE, AMYLASE,  in the last 168 hours No results found for this basename: AMMONIA,   in the last 168 hours CBC:  Recent Labs Lab 07/28/12 0435 07/29/12 0425 07/30/12 0443 07/31/12 0504 08/01/12 0430  WBC 18.7* 19.8* 19.1* 19.5* 18.5*  HGB 12.8* 12.7* 12.2* 11.8* 11.0*  HCT 39.5 38.7* 36.9* 35.3* 33.3*  MCV 95.0 94.2 93.9 93.6 94.1  PLT 230 226 254 267 237   Cardiac Enzymes: No results found for this basename: CKTOTAL, CKMB, CKMBINDEX, TROPONINI,  in the last 168 hours BNP (last 3 results)  Recent Labs  07/17/12 1315 07/23/12 0445  PROBNP 1312.0* 393.8   CBG:  Recent Labs Lab 07/31/12 1119 07/31/12 1701 07/31/12 2116 08/01/12 0730 08/01/12 1217  GLUCAP 193* 324* 100* 122* 244*    Recent Results (from the past 240 hour(s))  CULTURE, EXPECTORATED SPUTUM-ASSESSMENT     Status: None   Collection Time    07/30/12  9:58 PM      Result Value Range Status   Specimen Description SPUTUM  Final   Special Requests NONE   Final   Sputum evaluation     Final   Value: THIS SPECIMEN IS ACCEPTABLE. RESPIRATORY CULTURE REPORT TO FOLLOW.   Report Status 07/30/2012 FINAL   Final  CULTURE, RESPIRATORY (NON-EXPECTORATED)     Status: None   Collection Time    07/30/12  9:58 PM      Result Value Range Status   Specimen Description SPUTUM   Final   Special Requests NONE   Final   Gram Stain     Final   Value: NO WBC SEEN     NO SQUAMOUS EPITHELIAL CELLS SEEN     NO ORGANISMS SEEN   Culture     Final   Value: ABUNDANT CANDIDA ALBICANS     RARE FUNGUS (MOLD) ISOLATED, PROBABLE CONTAMINANT/COLONIZER (SAPROPHYTE). CONTACT MICROBIOLOGY IF FURTHER IDENTIFICATION REQUIRED [phone].   Report Status PENDING   Incomplete     Studies: Ct Head Wo Contrast  07/30/2012  *RADIOLOGY REPORT*  Clinical Data: Weakness, possible stroke.  History of diabetes. History of hypertension.  History of chronic renal disease.  CT HEAD WITHOUT CONTRAST  Technique:  Contiguous axial images were obtained from the base of the skull through the vertex without contrast.  Comparison: None.   Findings: The patient had difficulty remaining motionless for the study.  Images are suboptimal.  Small or subtle lesions could be overlooked.  There is no evidence for acute infarction, intracranial hemorrhage, mass lesion, hydrocephalus, or extra-axial fluid.  Mild atrophy is present.  No visible signs of large vessel infarct.  Hypodensity of the white matter suggests chronic microvascular ischemic change. Remote left periventricular lacunar infarct.  Moderate vascular calcification in the skull base carotid and vertebral segments.  No visible fracture or worrisome osseous lesion.  Visualized sinuses and mastoids clear. Bilateral cataract extraction.  IMPRESSION: Motion degraded scan limits sensitivity.  Chronic changes as described.  No acute intracranial findings.   Original Report Authenticated By: Davonna Belling, M.D.     Scheduled Meds: . albuterol  2.5 mg Nebulization Q6H  . antiseptic oral rinse  15 mL Mouth Rinse BID  . budesonide  0.25 mg Nebulization Q6H  . fluticasone  1 spray Each Nare Daily  . furosemide  40 mg Oral Daily  . guaiFENesin  1,200 mg Oral BID  . insulin aspart  0-20 Units Subcutaneous TID WC  . insulin aspart  3 Units Subcutaneous TID WC  . insulin glargine  15 Units Subcutaneous QHS  . ipratropium  0.5 mg Nebulization Q6H  . mometasone-formoterol  2 puff Inhalation BID  . potassium chloride  40 mEq Oral Once  . predniSONE  30 mg Oral Q breakfast  . sodium chloride  3 mL Intravenous Q12H  . Tamsulosin HCl  0.4 mg Oral QPC supper  . warfarin  1 mg Oral ONCE-1800  . Warfarin - Pharmacist Dosing Inpatient   Does not apply q1800   Continuous Infusions:    Time spent: 25 minutes    Beonka Amesquita  [facility] Pager [phone]. If 8PM-8AM, please contact night-coverage at [email or web address], password [facility] 08/01/2012, 12:34 PM  LOS: 15 days

## 2012-08-02 DIAGNOSIS — J9600 Acute respiratory failure, unspecified whether with hypoxia or hypercapnia: Secondary | ICD-10-CM

## 2012-08-02 LAB — CULTURE, RESPIRATORY W GRAM STAIN: Gram Stain: NONE SEEN

## 2012-08-02 LAB — BASIC METABOLIC PANEL
BUN: 35 mg/dL — ABNORMAL HIGH (ref 6–23)
Calcium: 8.3 mg/dL — ABNORMAL LOW (ref 8.4–10.5)
Creatinine, Ser: 1.53 mg/dL — ABNORMAL HIGH (ref 0.50–1.35)
GFR calc Af Amer: 50 mL/min — ABNORMAL LOW (ref >90)

## 2012-08-02 LAB — GLUCOSE, CAPILLARY
Glucose-Capillary: 192 mg/dL — ABNORMAL HIGH (ref 70–99)
Glucose-Capillary: 207 mg/dL — ABNORMAL HIGH (ref 70–99)

## 2012-08-02 LAB — PROTIME-INR
INR: 1.94 — ABNORMAL HIGH (ref 0.00–1.49)
Prothrombin Time: 21.4 seconds — ABNORMAL HIGH (ref 11.6–15.2)

## 2012-08-02 LAB — CBC
Hemoglobin: 10.6 g/dL — ABNORMAL LOW (ref 13.0–17.0)
MCH: 30.5 pg (ref 26.0–34.0)
MCV: 93.4 fL (ref 78.0–100.0)
RBC: 3.48 MIL/uL — ABNORMAL LOW (ref 4.22–5.81)
WBC: 18 10*3/uL — ABNORMAL HIGH (ref 4.0–10.5)

## 2012-08-02 MED ORDER — WARFARIN SODIUM 1 MG PO TABS
1.5000 mg | ORAL_TABLET | Freq: Once | ORAL | Status: AC
  Administered 2012-08-02: 1.5 mg via ORAL
  Filled 2012-08-02: qty 1

## 2012-08-02 MED ORDER — PREDNISONE 20 MG PO TABS
20.0000 mg | ORAL_TABLET | Freq: Every day | ORAL | Status: DC
  Administered 2012-08-03 – 2012-08-04 (×2): 20 mg via ORAL
  Filled 2012-08-02 (×3): qty 1

## 2012-08-02 MED ORDER — POTASSIUM CHLORIDE CRYS ER 20 MEQ PO TBCR
40.0000 meq | EXTENDED_RELEASE_TABLET | Freq: Once | ORAL | Status: AC
  Administered 2012-08-02: 40 meq via ORAL
  Filled 2012-08-02: qty 2

## 2012-08-02 NOTE — Progress Notes (Signed)
ANTICOAGULATION CONSULT NOTE - Follow Up  Pharmacy Consult for Warfarin Indication: LLE DVT and Probable PE  No Known Allergies  Patient Measurements: Height: 5\' 9"  (175.3 cm) Weight: 212 lb 4.9 oz (96.3 kg) IBW/kg (Calculated) : 70.7  Labs:  Recent Labs  07/30/12 1929  07/31/12 0504 08/01/12 0430 08/02/12 0520  HGB  --   < > 11.8* 11.0* 10.6*  HCT  --   --  35.3* 33.3* 32.5*  PLT  --   --  267 237 263  LABPROT  --   --  37.4* 27.9* 21.4*  INR  --   --  4.12* 2.77* 1.94*  CREATININE 1.86*  --   --  1.70* 1.53*  < > = values in this interval not displayed.  Estimated Creatinine Clearance: 47.7 ml/min (by C-G formula based on Cr of 1.53).  Anticoag Meds:  1/30 >> IV heparin >> 2/4 1/31 >> Warfarin PO >> 2/6 >> Lovenox >> 2/8  Assessment:  78 yo M admitted on 07/17/12 with acute resp failure, started on IV heparin for LE DVT. IV heparin stopped on 2/4 after completion of 5 day overlap with warfarin and heparin.  Lovenox started 2/6 for subtherapeutic INR in setting of acute LLE DVT and probable PE (unable to complete CT due to renal fxn; hx CKD III). Stopped on 2/8.  Hematuria noted 2/8, resolved 2/9.    INR 1.94 today, INR fluctuates greatly with small doses of Coumadin the past week. H/H very slowly trending down. RN noted small amount of hematuria this morning, amber color urine.  Will monitor closely.  Goal of Therapy:  Monitor platelets by anticoagulation protocol: Yes INR 2-3   Plan:   Warfarin 1.5 mg PO x1 tonight  Daily PT/INR, monitor for incr. hematuria  If pt is discharged today, suggest sending patient home on warfarin 1mg  PO daily after today's dose with close PT/INR follow up.   Geoffry Paradise, PharmD, BCPS Pager: [phone] 9:32 AM Pharmacy #: [phone]

## 2012-08-02 NOTE — Progress Notes (Addendum)
[facility] PROGRESS NOTE  HALIM SURRETTE ZOX:[id number] DOB: 04-18-1935 DOA: 07/17/2012 PCP: Ailene Ravel, MD  Brief narrative:  78 yo who came in with DVT and presumed PE (unable to get V/Q or CTA). Has had a complicated course with fluid overload, acute respiratory failure, AKI with urinary retention and hematuria.    Assessment/Plan:  #1 acute respiratory failure  Likely multifactorial in nature secondary to probable acute PE, probable community-acquired pneumonia per patchy infiltrate seen on chest x-ray with symptoms of a productive cough  ongoing for several weeks- chest x ray on 2/4 showed worsening patchy infiltrate . Cultures negative. Unable to get CT angiogram secondary to chronic kidney disease. Unable to get VQ scan as patient could not ay flat.  Patient treated with 9 days of IV vancomycin and Zosyn already. Now satting well on room air and needs to liters with nasal cannula on exertion.  Acute left leg weakness on 2/12  C/o acute left thigh and leg numbness with weakness of left thigh . Distal pulses palpable. Stat MRI ordered but could not tolerate it. CT of lumbar spine w/o contrast shows multilevel spinal spondylosis with moderate spinal stenosis at L3-L4. Mild foraminal and lateral recess stenosis secondary to asymmetric disc bulging. No high-grade foraminal stenosis or nerve root compression identified. Also has incidental finding of 4 cm distal abdominal aortic aneurysm.  . No bowel or urinary symptoms  Symptoms resolved slowly within few hours. Possibly related to pain and asymptomatic now.   hypoxia  Likely multifactorial secondary to probable PE in a patient with left lower extremity DVT and community-acquired pneumonia Currently improved.    probable PE and left lower leg DVT Patient had presented with severe hypoxia with sats in 80% requiring a face mask. Patient was tachycardic. Lower extremity Dopplers were positive for left lower extremity DVT. Unable to  get CT angiogram secondary to renal function. Heparin was started in the emergency room and a such unable to obtain a hypercoagulable panel. Unable to get VQ scan as patient could not lay flat. 2-D echo with EF of 65-70%. Right ventricle was mildly to moderately dilated. Intraventricular septum was D-shaped with evidence for right ventricular pressure/volume overload which could be consistent with a large PE.   COPD exac  Seen on presentation. Continue nebs and by mouth steroid. We'll taper slowly.   acute on chronic kidney disease stage III  On admission creatinine was 1.99. Renal ultrasound with slight thinning of the renal cortices. No acute abnormalities. ACE inhibitor held. On low dose Lasix. He had been improving.  Hx of anemia  H&H stable.   leukocytosis  Likely reactive secondary to probable PE , pneumonia and being on steroids   diabetes mellitus  Hemoglobin A1c is 8.4. Increased Lantus to 10 units daily. If this is still elevated. We'll increase dose to 15 units. Continue sliding scale insulin   hypertension  Stable. Marland Kitchen Resume home meds   urinary retention  -foley placed . Followup with urology as outpatient. (2-3 weeks after discharge with Dr. Isabel Caprice.) Continue Flomax for BPH  AAA  incidental finding on CT with 4 cm in size. Needs outpatient follow up     Consults:  Pulmonary Neurology Urology  Disposition: Initially planned for LTAC however given significant clinical improvement he should benefit from going to skilled nursing facility. Will likely be discharged on 2/17.  HPI/Subjective: Informs feeling better daily. No overnight issues  Objective: Filed Vitals:   08/02/12 0448 08/02/12 0856 08/02/12 1300 08/02/12 1545  BP:  126/56  131/64   Pulse: 77  79   Temp: 98.1 F (36.7 C)  98 F (36.7 C)   TempSrc: Oral  Oral   Resp: 18  16   Height:      Weight: 96.3 kg (212 lb 4.9 oz)     SpO2: 93% 96% 97% 94%    Intake/Output Summary (Last 24 hours) at  08/02/12 1622 Last data filed at 08/02/12 1500  Gross per 24 hour  Intake    720 ml  Output   2675 ml  Net  -1955 ml   Filed Weights   07/31/12 0439 08/01/12 0450 08/02/12 0448  Weight: 96.1 kg (211 lb 13.8 oz) 95.9 kg (211 lb 6.7 oz) 96.3 kg (212 lb 4.9 oz)    Exam:  General: Elderly male in no acute distress  HEENT: no pallor, moist mucosa  Cardiovascular: NS1&S2, no murmurs  Respiratory: clear b/l, no wheezing or rhonchi  Abdomen: soft, NT, ND, BS+ foley in place  EXT: warm, normal tone , power and reflexes over B/L LE. Normal sensations  CNS: AAOX3    Data Reviewed: Basic Metabolic Panel:  Recent Labs Lab 07/28/12 1105 07/29/12 0425 07/30/12 1929 08/01/12 0430 08/02/12 0520  NA 132* 133* 136 134* 136  K 3.8 3.8 3.4* 3.2* 3.1*  CL 95* 95* 93* 94* 98  CO2 28 28 33* 30 30  GLUCOSE 243* 194* 167* 107* 159*  BUN 59* 56* 48* 41* 35*  CREATININE 2.45* 2.16* 1.86* 1.70* 1.53*  CALCIUM 8.9 8.9 8.9 8.4 8.3*  MG  --   --  1.8  --   --    Liver Function Tests: No results found for this basename: AST, ALT, ALKPHOS, BILITOT, PROT, ALBUMIN,  in the last 168 hours No results found for this basename: LIPASE, AMYLASE,  in the last 168 hours No results found for this basename: AMMONIA,  in the last 168 hours CBC:  Recent Labs Lab 07/29/12 0425 07/30/12 0443 07/31/12 0504 08/01/12 0430 08/02/12 0520  WBC 19.8* 19.1* 19.5* 18.5* 18.0*  HGB 12.7* 12.2* 11.8* 11.0* 10.6*  HCT 38.7* 36.9* 35.3* 33.3* 32.5*  MCV 94.2 93.9 93.6 94.1 93.4  PLT 226 254 267 237 263   Cardiac Enzymes: No results found for this basename: CKTOTAL, CKMB, CKMBINDEX, TROPONINI,  in the last 168 hours BNP (last 3 results)  Recent Labs  07/17/12 1315 07/23/12 0445  PROBNP 1312.0* 393.8   CBG:  Recent Labs Lab 08/01/12 1217 08/01/12 1735 08/01/12 2124 08/02/12 0740 08/02/12 1159  GLUCAP 244* 265* 211* 133* 323*    Recent Results (from the past 240 hour(s))  CULTURE, EXPECTORATED  SPUTUM-ASSESSMENT     Status: None   Collection Time    07/30/12  9:58 PM      Result Value Range Status   Specimen Description SPUTUM   Final   Special Requests NONE   Final   Sputum evaluation     Final   Value: THIS SPECIMEN IS ACCEPTABLE. RESPIRATORY CULTURE REPORT TO FOLLOW.   Report Status 07/30/2012 FINAL   Final  CULTURE, RESPIRATORY (NON-EXPECTORATED)     Status: None   Collection Time    07/30/12  9:58 PM      Result Value Range Status   Specimen Description SPUTUM   Final   Special Requests NONE   Final   Gram Stain     Final   Value: NO WBC SEEN     NO SQUAMOUS EPITHELIAL CELLS SEEN  NO ORGANISMS SEEN   Culture     Final   Value: ABUNDANT CANDIDA ALBICANS     RARE FUNGUS (MOLD) ISOLATED, PROBABLE CONTAMINANT/COLONIZER (SAPROPHYTE). CONTACT MICROBIOLOGY IF FURTHER IDENTIFICATION REQUIRED [phone].   Report Status 08/02/2012 FINAL   Final     Studies: No results found.  Scheduled Meds: . albuterol  2.5 mg Nebulization Q6H  . antiseptic oral rinse  15 mL Mouth Rinse BID  . budesonide  0.25 mg Nebulization Q6H  . fluticasone  1 spray Each Nare Daily  . furosemide  40 mg Oral Daily  . guaiFENesin  1,200 mg Oral BID  . insulin aspart  0-20 Units Subcutaneous TID WC  . insulin aspart  3 Units Subcutaneous TID WC  . insulin glargine  15 Units Subcutaneous QHS  . ipratropium  0.5 mg Nebulization Q6H  . mometasone-formoterol  2 puff Inhalation BID  . predniSONE  30 mg Oral Q breakfast  . sodium chloride  3 mL Intravenous Q12H  . Tamsulosin HCl  0.4 mg Oral QPC supper  . warfarin  1.5 mg Oral ONCE-1800  . Warfarin - Pharmacist Dosing Inpatient   Does not apply q1800   Continuous Infusions:     Time spent: 25 minutes    Mehkai Gallo  [facility] Pager [phone] If 8PM-8AM, please contact night-coverage at [email or web address], password [facility] 08/02/2012, 4:22 PM  LOS: 16 days

## 2012-08-03 DIAGNOSIS — N179 Acute kidney failure, unspecified: Secondary | ICD-10-CM | POA: Diagnosis not present

## 2012-08-03 DIAGNOSIS — I82409 Acute embolism and thrombosis of unspecified deep veins of unspecified lower extremity: Secondary | ICD-10-CM | POA: Diagnosis not present

## 2012-08-03 DIAGNOSIS — I2699 Other pulmonary embolism without acute cor pulmonale: Secondary | ICD-10-CM | POA: Diagnosis not present

## 2012-08-03 DIAGNOSIS — R0902 Hypoxemia: Secondary | ICD-10-CM | POA: Diagnosis not present

## 2012-08-03 LAB — BASIC METABOLIC PANEL
CO2: 32 mEq/L (ref 19–32)
GFR calc non Af Amer: 41 mL/min — ABNORMAL LOW (ref >90)
Glucose, Bld: 220 mg/dL — ABNORMAL HIGH (ref 70–99)
Potassium: 3.4 mEq/L — ABNORMAL LOW (ref 3.5–5.1)
Sodium: 135 mEq/L (ref 135–145)

## 2012-08-03 LAB — GLUCOSE, CAPILLARY
Glucose-Capillary: 185 mg/dL — ABNORMAL HIGH (ref 70–99)
Glucose-Capillary: 202 mg/dL — ABNORMAL HIGH (ref 70–99)
Glucose-Capillary: 293 mg/dL — ABNORMAL HIGH (ref 70–99)

## 2012-08-03 LAB — PROTIME-INR: INR: 1.65 — ABNORMAL HIGH (ref 0.00–1.49)

## 2012-08-03 MED ORDER — WARFARIN SODIUM 2.5 MG PO TABS
2.5000 mg | ORAL_TABLET | Freq: Once | ORAL | Status: AC
  Administered 2012-08-03: 2.5 mg via ORAL
  Filled 2012-08-03: qty 1

## 2012-08-03 MED ORDER — POTASSIUM CHLORIDE CRYS ER 20 MEQ PO TBCR
40.0000 meq | EXTENDED_RELEASE_TABLET | Freq: Once | ORAL | Status: AC
  Administered 2012-08-03: 40 meq via ORAL
  Filled 2012-08-03: qty 2

## 2012-08-03 MED ORDER — INFLUENZA VIRUS VACC SPLIT PF IM SUSP
0.5000 mL | INTRAMUSCULAR | Status: DC
  Filled 2012-08-03 (×2): qty 0.5

## 2012-08-03 NOTE — Progress Notes (Signed)
Oxygen saturation at rest 91%-RA during ambulation sat lowest is 89%-RA, with some shortness of breath, placed patient back on 2Ls of oxygen. Will continue to assess patient.

## 2012-08-03 NOTE — Progress Notes (Signed)
ANTICOAGULATION CONSULT NOTE - Follow Up  Pharmacy Consult for Warfarin Indication: LLE DVT and Probable PE  No Known Allergies  Patient Measurements: Height: 5\' 9"  (175.3 cm) Weight: 212 lb 8.4 oz (96.4 kg) IBW/kg (Calculated) : 70.7  Labs:  Recent Labs  08/01/12 0430 08/02/12 0520 08/03/12 0457  HGB 11.0* 10.6*  --   HCT 33.3* 32.5*  --   PLT 237 263  --   LABPROT 27.9* 21.4* 19.0*  INR 2.77* 1.94* 1.65*  CREATININE 1.70* 1.53* 1.58*    Estimated Creatinine Clearance: 46.3 ml/min (by C-G formula based on Cr of 1.58).  Anticoag Meds:  1/30 >> IV heparin >> 2/4 1/31 >> Warfarin PO >> 2/6 >> Lovenox >> 2/8  Assessment:  78 yo M admitted on 07/17/12 with acute resp failure, started on IV heparin for LE DVT. IV heparin stopped on 2/4 after completion of 5 day overlap with warfarin and heparin.  Lovenox started 2/6 for subtherapeutic INR in setting of acute LLE DVT and probable PE (unable to complete CT due to renal fxn; hx CKD III). Stopped on 2/8.  Hematuria noted 2/8, resolved 2/9.  Returned 2/15 but very minimal per RN.    INR down to 1.65 today, INR fluctuates greatly with small doses of Coumadin the past week. H/H very slowly trending down. Minimal hematuria continues per RN.  Goal of Therapy:  Monitor platelets by anticoagulation protocol: Yes INR 2-3   Plan:   Warfarin 2.5 mg PO x1 tonight  Daily PT/INR, monitor for incr. hematuria  Geoffry Paradise, PharmD, BCPS Pager: [phone] 9:28 AM Pharmacy #: [phone]

## 2012-08-03 NOTE — Progress Notes (Signed)
[facility] PROGRESS NOTE  Alexander Kirby WJX:[id number] DOB: 1934-11-20 DOA: 07/17/2012 PCP: Ailene Ravel, MD  Brief narrative:  78 yo who came in with DVT and presumed PE (unable to get V/Q or CTA). Has had a complicated course with fluid overload, acute respiratory failure, AKI with urinary retention and hematuria.   Assessment/Plan:  #1 acute respiratory failure  Likely multifactorial in nature secondary to probable acute PE, probable community-acquired pneumonia per patchy infiltrate seen on chest x-ray with symptoms of a productive cough ongoing for several weeks- chest x ray on 2/4 showed worsening patchy infiltrate .  Cultures negative. Unable to get CT angiogram secondary to chronic kidney disease. Unable to get VQ scan as patient could not ay flat.  Patient treated with 9 days of IV vancomycin and Zosyn already. Now satting well on room air and needs to liters with nasal cannula on exertion.   Acute left leg weakness on 2/12  C/o acute left thigh and leg numbness with weakness of left thigh . Distal pulses palpable. Stat MRI ordered but could not tolerate it. CT of lumbar spine w/o contrast shows multilevel spinal spondylosis with moderate spinal stenosis at L3-L4. Mild foraminal and lateral recess stenosis secondary to asymmetric disc bulging. No high-grade foraminal stenosis or nerve root compression identified. Also has incidental finding of 4 cm distal abdominal aortic aneurysm.  . No bowel or urinary symptoms  Symptoms resolved slowly within few hours. Possibly related to pain and asymptomatic now.   hypoxia  Likely multifactorial secondary to probable PE in a patient with left lower extremity DVT and community-acquired pneumonia  Currently improved.   probable PE and left lower leg DVT  Patient had presented with severe hypoxia with sats in 80% requiring a face mask. Patient was tachycardic. Lower extremity Dopplers were positive for left lower extremity DVT. Unable to  get CT angiogram secondary to renal function. Heparin was started in the emergency room and a such unable to obtain a hypercoagulable panel. Unable to get VQ scan as patient could not lay flat. 2-D echo with EF of 65-70%. Right ventricle was mildly to moderately dilated. Intraventricular septum was D-shaped with evidence for right ventricular pressure/volume overload which could be consistent with a large PE.  patint now on coumadin. Bridged wit heparin and lovenox iniftially which were both held due to hematuria. INR subtherapeutic. Will ask pharmacy to dose appropriately.  COPD exac  Seen on presentation. Continue nebs and by mouth steroid. taper slowly.   acute on chronic kidney disease stage III  On admission creatinine was 1.99. Renal ultrasound with slight thinning of the renal cortices. No acute abnormalities. ACE inhibitor held. On low dose Lasix. Renal fn improving.    Hx of anemia  H&H stable.   leukocytosis  Likely reactive secondary to probable PE , pneumonia and being on steroids   diabetes mellitus  Hemoglobin A1c is 8.4. Increased Lantus to 10 units daily. If this is still elevated. We'll increase dose to 15 units. Continue sliding scale insulin   hypertension  Stable. Marland Kitchen Resume home meds   urinary retention with hematuria -foley placed . Followup with urology as outpatient. (2-3 weeks after discharge with Dr. Isabel Caprice.) Continue Flomax for BPH   AAA  incidental finding on CT with 4 cm in size. Needs outpatient follow up   Consults:  Pulmonary  Neurology  Urology   Disposition: Initially planned for LTAC however given significant clinical improvement he should benefit from going to skilled nursing facility. Will likely  be discharged on 2/17.  HPI/Subjective: informs breathing to be stable. No overnight issues  Objective: Filed Vitals:   08/03/12 0212 08/03/12 0500 08/03/12 0527 08/03/12 0855  BP:   117/63   Pulse:   84   Temp:   98.3 F (36.8 C)   TempSrc:    Oral   Resp:   18   Height:      Weight:  96.4 kg (212 lb 8.4 oz)    SpO2: 96%  98% 95%    Intake/Output Summary (Last 24 hours) at 08/03/12 1034 Last data filed at 08/03/12 0700  Gross per 24 hour  Intake    480 ml  Output   2200 ml  Net  -1720 ml   Filed Weights   08/01/12 0450 08/02/12 0448 08/03/12 0500  Weight: 95.9 kg (211 lb 6.7 oz) 96.3 kg (212 lb 4.9 oz) 96.4 kg (212 lb 8.4 oz)    Exam:  General: Elderly male in no acute distress  HEENT: no pallor, moist mucosa  Cardiovascular: NS1&S2, no murmurs  Respiratory: clear b/l, no wheezing or rhonchi  Abdomen: soft, NT, ND, BS+ foley in place with dark urine EXT: warm, normal tone , power and reflexes over B/L LE. Normal sensations  CNS: AAOX3    Data Reviewed: Basic Metabolic Panel:  Recent Labs Lab 07/29/12 0425 07/30/12 1929 08/01/12 0430 08/02/12 0520 08/03/12 0457  NA 133* 136 134* 136 135  K 3.8 3.4* 3.2* 3.1* 3.4*  CL 95* 93* 94* 98 96  CO2 28 33* 30 30 32  GLUCOSE 194* 167* 107* 159* 220*  BUN 56* 48* 41* 35* 35*  CREATININE 2.16* 1.86* 1.70* 1.53* 1.58*  CALCIUM 8.9 8.9 8.4 8.3* 8.1*  MG  --  1.8  --   --   --    Liver Function Tests: No results found for this basename: AST, ALT, ALKPHOS, BILITOT, PROT, ALBUMIN,  in the last 168 hours No results found for this basename: LIPASE, AMYLASE,  in the last 168 hours No results found for this basename: AMMONIA,  in the last 168 hours CBC:  Recent Labs Lab 07/29/12 0425 07/30/12 0443 07/31/12 0504 08/01/12 0430 08/02/12 0520  WBC 19.8* 19.1* 19.5* 18.5* 18.0*  HGB 12.7* 12.2* 11.8* 11.0* 10.6*  HCT 38.7* 36.9* 35.3* 33.3* 32.5*  MCV 94.2 93.9 93.6 94.1 93.4  PLT 226 254 267 237 263   Cardiac Enzymes: No results found for this basename: CKTOTAL, CKMB, CKMBINDEX, TROPONINI,  in the last 168 hours BNP (last 3 results)  Recent Labs  07/17/12 1315 07/23/12 0445  PROBNP 1312.0* 393.8   CBG:  Recent Labs Lab 08/02/12 0740 08/02/12 1159  08/02/12 1650 08/02/12 2043 08/03/12 0753  GLUCAP 133* 323* 192* 207* 185*    Recent Results (from the past 240 hour(s))  CULTURE, EXPECTORATED SPUTUM-ASSESSMENT     Status: None   Collection Time    07/30/12  9:58 PM      Result Value Range Status   Specimen Description SPUTUM   Final   Special Requests NONE   Final   Sputum evaluation     Final   Value: THIS SPECIMEN IS ACCEPTABLE. RESPIRATORY CULTURE REPORT TO FOLLOW.   Report Status 07/30/2012 FINAL   Final  CULTURE, RESPIRATORY (NON-EXPECTORATED)     Status: None   Collection Time    07/30/12  9:58 PM      Result Value Range Status   Specimen Description SPUTUM   Final   Special Requests NONE  Final   Gram Stain     Final   Value: NO WBC SEEN     NO SQUAMOUS EPITHELIAL CELLS SEEN     NO ORGANISMS SEEN   Culture     Final   Value: ABUNDANT CANDIDA ALBICANS     RARE FUNGUS (MOLD) ISOLATED, PROBABLE CONTAMINANT/COLONIZER (SAPROPHYTE). CONTACT MICROBIOLOGY IF FURTHER IDENTIFICATION REQUIRED [phone].   Report Status 08/02/2012 FINAL   Final     Studies: No results found.  Scheduled Meds: . albuterol  2.5 mg Nebulization Q6H  . antiseptic oral rinse  15 mL Mouth Rinse BID  . budesonide  0.25 mg Nebulization Q6H  . fluticasone  1 spray Each Nare Daily  . furosemide  40 mg Oral Daily  . guaiFENesin  1,200 mg Oral BID  . insulin aspart  0-20 Units Subcutaneous TID WC  . insulin aspart  3 Units Subcutaneous TID WC  . insulin glargine  15 Units Subcutaneous QHS  . ipratropium  0.5 mg Nebulization Q6H  . mometasone-formoterol  2 puff Inhalation BID  . predniSONE  20 mg Oral Q breakfast  . sodium chloride  3 mL Intravenous Q12H  . Tamsulosin HCl  0.4 mg Oral QPC supper  . warfarin  2.5 mg Oral ONCE-1800  . Warfarin - Pharmacist Dosing Inpatient   Does not apply q1800   Continuous Infusions:     Time spent: 25 minutes    Zarian Colpitts  [facility] Pager [phone] If 8PM-8AM, please contact  night-coverage at [email or web address], password [facility] 08/03/2012, 10:34 AM  LOS: 17 days

## 2012-08-04 DIAGNOSIS — I2699 Other pulmonary embolism without acute cor pulmonale: Secondary | ICD-10-CM | POA: Diagnosis not present

## 2012-08-04 DIAGNOSIS — N183 Chronic kidney disease, stage 3 unspecified: Secondary | ICD-10-CM | POA: Diagnosis present

## 2012-08-04 DIAGNOSIS — J9600 Acute respiratory failure, unspecified whether with hypoxia or hypercapnia: Secondary | ICD-10-CM | POA: Diagnosis not present

## 2012-08-04 DIAGNOSIS — J189 Pneumonia, unspecified organism: Secondary | ICD-10-CM | POA: Diagnosis present

## 2012-08-04 DIAGNOSIS — I82409 Acute embolism and thrombosis of unspecified deep veins of unspecified lower extremity: Secondary | ICD-10-CM | POA: Diagnosis not present

## 2012-08-04 DIAGNOSIS — I714 Abdominal aortic aneurysm, without rupture: Secondary | ICD-10-CM | POA: Diagnosis present

## 2012-08-04 DIAGNOSIS — M5126 Other intervertebral disc displacement, lumbar region: Secondary | ICD-10-CM | POA: Diagnosis present

## 2012-08-04 DIAGNOSIS — N179 Acute kidney failure, unspecified: Secondary | ICD-10-CM | POA: Diagnosis not present

## 2012-08-04 LAB — GLUCOSE, CAPILLARY
Glucose-Capillary: 200 mg/dL — ABNORMAL HIGH (ref 70–99)
Glucose-Capillary: 174 mg/dL — ABNORMAL HIGH (ref 70–99)

## 2012-08-04 MED ORDER — IPRATROPIUM BROMIDE 0.02 % IN SOLN: 0.5000 mg | Freq: Four times a day (QID) | RESPIRATORY_TRACT | Status: DC

## 2012-08-04 MED ORDER — ENOXAPARIN SODIUM 100 MG/ML ~~LOC~~ SOLN
100.0000 mg | SUBCUTANEOUS | Status: DC
  Administered 2012-08-04: 100 mg via SUBCUTANEOUS
  Filled 2012-08-04: qty 1

## 2012-08-04 MED ORDER — WARFARIN SODIUM 5 MG PO TABS: 5.0000 mg | ORAL_TABLET | Freq: Once | ORAL | Status: DC

## 2012-08-04 MED ORDER — GUAIFENESIN ER 600 MG PO TB12: 1200.0000 mg | ORAL_TABLET | Freq: Two times a day (BID) | ORAL | Status: DC

## 2012-08-04 MED ORDER — OXYCODONE HCL 5 MG PO TABS: 5.0000 mg | ORAL_TABLET | Freq: Four times a day (QID) | ORAL | Status: DC | PRN

## 2012-08-04 MED ORDER — PREDNISONE 20 MG PO TABS: 10.0000 mg | ORAL_TABLET | Freq: Every day | ORAL | Status: DC

## 2012-08-04 MED ORDER — ENOXAPARIN SODIUM 100 MG/ML ~~LOC~~ SOLN: 100.0000 mg | SUBCUTANEOUS | Status: DC

## 2012-08-04 MED ORDER — FUROSEMIDE 40 MG PO TABS: 40.0000 mg | ORAL_TABLET | Freq: Every day | ORAL | Status: DC

## 2012-08-04 MED ORDER — WARFARIN SODIUM 5 MG PO TABS
5.0000 mg | ORAL_TABLET | Freq: Once | ORAL | Status: DC
  Filled 2012-08-04: qty 1

## 2012-08-04 MED ORDER — INSULIN GLARGINE 100 UNIT/ML ~~LOC~~ SOLN: 15.0000 [IU] | Freq: Every day | SUBCUTANEOUS | Status: DC

## 2012-08-04 MED ORDER — BISACODYL 5 MG PO TBEC: 5.0000 mg | DELAYED_RELEASE_TABLET | Freq: Every day | ORAL | Status: DC | PRN

## 2012-08-04 NOTE — Plan of Care (Signed)
Problem: Discharge Progression Outcomes Goal: Activity appropriate for discharge plan Outcome: Progressing F/U in SNF for rehab.

## 2012-08-04 NOTE — Plan of Care (Signed)
Problem: Discharge Progression Outcomes Goal: Discharge plan in place and appropriate Outcome: Completed/Met Date Met:  08/04/12 Discharge packet prepared by CSW for the nursing home.

## 2012-08-04 NOTE — Progress Notes (Signed)
Patient ID: Alexander Kirby, male   DOB: 01/03/1935, 78 y.o.   MRN: [id number]   Subjective: Patient reports no new urologic issues or concerns. His Foley catheters remained indwelling and he has tolerated this well. He is urine is grossly clear although somewhat cloudy. No evidence of ongoing hematuria.  Objective: Vital signs in last 24 hours: Temp:  [97.7 F (36.5 C)-98.4 F (36.9 C)] 98.4 F (36.9 C) (02/17 0644) Pulse Rate:  [84-97] 84 (02/17 0644) Resp:  [18-20] 18 (02/17 0644) BP: (121-141)/(67-72) 141/71 mmHg (02/17 0644) SpO2:  [91 %-96 %] 93 % (02/17 0915) Weight:  [95.5 kg (210 lb 8.6 oz)] 95.5 kg (210 lb 8.6 oz) (02/17 0644)  Intake/Output from previous day: 02/16 0701 - 02/17 0700 In: 240 [P.O.:240] Out: 2000 [Urine:2000] Intake/Output this shift: Total I/O In: 360 [P.O.:360] Out: 300 [Urine:300]  Physical Exam:  Constitutional: Vital signs reviewed. WD WN in NAD   Eyes: PERRL, No scleral icterus.   Cardiovascular: RRR Pulmonary/Chest: Normal effort Abdominal: Soft. Non-tender, non-distended, bowel sounds are normal, no masses, organomegaly, or guarding present.  Genitourinary:indwelling Foley catheter. Extremities: No cyanosis or edema   Lab Results:  Recent Labs  08/02/12 0520  HGB 10.6*  HCT 32.5*   BMET  Recent Labs  08/02/12 0520 08/03/12 0457  NA 136 135  K 3.1* 3.4*  CL 98 96  CO2 30 32  GLUCOSE 159* 220*  BUN 35* 35*  CREATININE 1.53* 1.58*  CALCIUM 8.3* 8.1*    Recent Labs  08/02/12 0520 08/03/12 0457 08/04/12 0452  INR 1.94* 1.65* 1.52*   No results found for this basename: LABURIN,  in the last 72 hours Results for orders placed during the hospital encounter of 07/17/12  MRSA PCR SCREENING     Status: None   Collection Time    07/17/12  6:46 PM      Result Value Range Status   MRSA by PCR NEGATIVE  NEGATIVE Final   Comment:            The GeneXpert MRSA Assay (FDA     approved for NASAL specimens     only), is one component  of a     comprehensive MRSA colonization     surveillance program. It is not     intended to diagnose MRSA     infection nor to guide or     monitor treatment for     MRSA infections.  URINE CULTURE     Status: None   Collection Time    07/17/12 11:07 PM      Result Value Range Status   Specimen Description URINE, CLEAN CATCH   Final   Special Requests NONE   Final   Culture  Setup Time 07/18/2012 09:18   Final   Colony Count 75,000 COLONIES/ML   Final   Culture ENTEROCOCCUS SPECIES   Final   Report Status 07/21/2012 FINAL   Final   Organism ID, Bacteria ENTEROCOCCUS SPECIES   Final  CULTURE, EXPECTORATED SPUTUM-ASSESSMENT     Status: None   Collection Time    07/30/12  9:58 PM      Result Value Range Status   Specimen Description SPUTUM   Final   Special Requests NONE   Final   Sputum evaluation     Final   Value: THIS SPECIMEN IS ACCEPTABLE. RESPIRATORY CULTURE REPORT TO FOLLOW.   Report Status 07/30/2012 FINAL   Final  CULTURE, RESPIRATORY (NON-EXPECTORATED)     Status: None  Collection Time    07/30/12  9:58 PM      Result Value Range Status   Specimen Description SPUTUM   Final   Special Requests NONE   Final   Gram Stain     Final   Value: NO WBC SEEN     NO SQUAMOUS EPITHELIAL CELLS SEEN     NO ORGANISMS SEEN   Culture     Final   Value: ABUNDANT CANDIDA ALBICANS     RARE FUNGUS (MOLD) ISOLATED, PROBABLE CONTAMINANT/COLONIZER (SAPROPHYTE). CONTACT MICROBIOLOGY IF FURTHER IDENTIFICATION REQUIRED [phone].   Report Status 08/02/2012 FINAL   Final    Studies/Results: No results found.  Assessment/Plan:   Patient will hopefully be leaving for rehabilitation facility in the near future. We will leave a Foley catheter indwelling for now and plan on a followup in our office for cystoscopy and voiding trial in about 2 weeks. If he continues to be unable to void and we will press ahead with video urodynamics to better determine how much of this is an obstructive  process versus poor bladder contractility. He should remain on alpha-blocker therapy.   LOS: 18 days   Melek Pownall S 08/04/2012, 12:28 PM

## 2012-08-04 NOTE — Progress Notes (Signed)
PAtient to [facility] nursing facility for rehab via ambulance. Alert and oriented X3 denies any pain/distress. Skin intact with BUE discoloration and weeping wrapped with gauze. Patient D/C'd with foley inplace F/U with urologist outpatient. Wife at the bedside during transfer.

## 2012-08-04 NOTE — Progress Notes (Signed)
Physical Therapy Treatment Patient Details Name: Alexander Kirby MRN: [id number] DOB: 1934-08-14 Today's Date: 08/04/2012 Time: 3086-5784 PT Time Calculation (min): 8 min  PT Assessment / Plan / Recommendation Comments on Treatment Session  Pt plans to D/C to [facility] for Rehab today. Progressing well.    Follow Up Recommendations  SNF     Does the patient have the potential to tolerate intense rehabilitation     Barriers to Discharge        Equipment Recommendations  Rolling walker with 5" wheels    Recommendations for Other Services    Frequency Min 3X/week   Plan Discharge plan remains appropriate;Frequency remains appropriate    Precautions / Restrictions Precautions Precautions: Fall Precaution Comments: monitor sats/HR on O2 Restrictions Weight Bearing Restrictions: No   Pertinent Vitals/Pain C/o "some left hip pain, nothing new"    Mobility  Bed Mobility Bed Mobility: Not assessed Details for Bed Mobility Assistance: Pt OOb in recliner Transfers Transfers: Sit to Stand;Stand to Sit Sit to Stand: 4: Min guard;With upper extremity assist;From chair/3-in-1 Stand to Sit: 4: Min guard;With upper extremity assist;To chair/3-in-1 Details for Transfer Assistance: Min/guard for safety with cues for hand placement.  Ambulation/Gait Ambulation/Gait Assistance: 4: Min guard Ambulation Distance (Feet): 156 Feet Assistive device: Rolling walker Gait Pattern: Step-through pattern Gait velocity: decreased     PT Goals                                         progressing    Visit Information  Last PT Received On: 08/04/12 Assistance Needed: +2    Subjective Data  Subjective: I'm going to [facility] today   Cognition    good   Balance   good  End of Session PT - End of Session Equipment Utilized During Treatment: Oxygen Activity Tolerance: Patient limited by fatigue Patient left: in chair;with call bell/phone within reach;with family/visitor present Nurse Communication:  Mobility status   Felecia Shelling  PTA [facility]  Acute  Rehab Pager      [phone]

## 2012-08-04 NOTE — Progress Notes (Signed)
Clinical Social Work Department CLINICAL SOCIAL WORK PLACEMENT NOTE 08/04/2012  Patient:  Alexander Kirby, Alexander Kirby  Account Number:  [id number] Admit date:  07/17/2012  Clinical Social Worker:  Becky Sax, LCSW  Date/time:  08/04/2012 12:00 M  Clinical Social Work is seeking post-discharge placement for this patient at the following level of care:   SKILLED NURSING   (*CSW will update this form in Epic as items are completed)   08/04/2012  Patient/family provided with [facility] Department of Clinical Social Work's list of facilities offering this level of care within the geographic area requested by the patient (or if unable, by the patient's family).  08/04/2012  Patient/family informed of their freedom to choose among providers that offer the needed level of care, that participate in Medicare, Medicaid or managed care program needed by the patient, have an available bed and are willing to accept the patient.  08/04/2012  Patient/family informed of [facility]' ownership interest in [facility], as well as of the fact that they are under no obligation to receive care at this facility.  PASARR submitted to EDS on 08/04/2012 PASARR number received from EDS on 08/04/2012  FL2 transmitted to all facilities in geographic area requested by pt/family on  08/04/2012 FL2 transmitted to all facilities within larger geographic area on 08/04/2012  Patient informed that his/her managed care company has contracts with or will negotiate with  certain facilities, including the following:     Patient/family informed of bed offers received:  08/04/2012 Patient chooses bed at [facility], [place] Physician recommends and patient chooses bed at  [facility], [place]  Patient to be transferred to [facility], [place] on  08/04/2012 Patient to be transferred to facility by ptar  The following physician request were entered in  Epic:   Additional Comments:

## 2012-08-04 NOTE — Progress Notes (Addendum)
ANTICOAGULATION CONSULT NOTE - Follow Up  Pharmacy Consult for Warfarin Indication: LLE DVT and Probable PE  No Known Allergies  Patient Measurements: Height: 5\' 9"  (175.3 cm) Weight: 210 lb 8.6 oz (95.5 kg) IBW/kg (Calculated) : 70.7  Labs:  Recent Labs  08/02/12 0520 08/03/12 0457 08/04/12 0452  HGB 10.6*  --   --   HCT 32.5*  --   --   PLT 263  --   --   LABPROT 21.4* 19.0* 17.9*  INR 1.94* 1.65* 1.52*  CREATININE 1.53* 1.58*  --     Estimated Creatinine Clearance: 46.1 ml/min (by C-G formula based on Cr of 1.58).  Anticoag Meds:  1/30 >> IV heparin >> 2/4 1/31 >> Warfarin PO >> 2/6 >> Lovenox >> 2/8  Assessment:  78 yo M admitted on 07/17/12 with acute resp failure, started on IV heparin for LE DVT. IV heparin stopped on 2/4 after completion of 5 day overlap with warfarin and heparin.  Lovenox started 2/6 for subtherapeutic INR in setting of acute LLE DVT and probable PE (unable to complete CT due to renal fxn; hx CKD III). Stopped on 2/8.  Hematuria noted 2/8, resolved 2/9.  Returned 2/15 but very minimal per RN.    INR down to 1.52 today, INR fluctuates greatly with small doses of Coumadin the past week. H/H very slowly trending down. Minimal hematuria continues per RN.  Goal of Therapy:  Monitor platelets by anticoagulation protocol: Yes INR 2-3   Plan:   Increase Warfarin 5 mg PO x1 tonight  Daily PT/INR, monitor for incr. hematuria  Loralee Pacas, PharmD, BCPS Pager: [phone] 08/04/2012 8:12 AM  Addendum: Spoke with Dr. Gonzella Lex, would like to bridge with Lovenox until INR therapeutic. Because of elevated SCr and slight hematuria, requesting once-daily dosing.  Begin Lovenox 1mg /kg (100mg ) sq q24h  Would check INR daily at SNF to determine d/c of lovenox  Loralee Pacas, PharmD, BCPS 08/04/2012 9:26 AM

## 2012-08-04 NOTE — Progress Notes (Signed)
Patient will be ready for discharge today and is agreeable to discharge to [facility].  Alexander Kirby C. Areatha Kalata MSW, LCSW [phone]

## 2012-08-04 NOTE — Discharge Summary (Addendum)
Physician Discharge Summary  Alexander Kirby QMV:[id number] DOB: [birth year] DOA: 07/17/2012  PCP: Ailene Ravel, MD  Admit date: 07/17/2012 Discharge date: 08/04/2012  Time spent: 45 minutes  Recommendations for Outpatient Follow-up:  1. Discharged to skilled nursing facility 2. Continue Lovenox to bridge for Coumadin until INR therapeutic 3. Needs follow up evaluation of his abdominal aortic aneurysm seen incidentally on CAT scan.  Discharge Diagnoses:  Principal Problem:   Acute respiratory failure  Active Problems:   DVT (deep venous thrombosis)   PE, presumed   COPD with exacerbation   Community acquired pneumonia   Diabetes mellitus   HTN (hypertension)   Hyperlipidemia   Acute on CKD (chronic kidney disease) stage 3, GFR 30-59 ml/min   BPH (benign prostatic hypertrophy) with urinary obstruction   Calculus of kidney   Urinary retention with incomplete bladder emptying   Transient weakness of left leg   Hypokalemia   AAA (abdominal aortic aneurysm) without rupture   Discharge Condition: Fair  Diet recommendation: Diabetic  Filed Weights   08/02/12 0448 08/03/12 0500 08/04/12 0644  Weight: 96.3 kg (212 lb 4.9 oz) 96.4 kg (212 lb 8.4 oz) 95.5 kg (210 lb 8.6 oz)    History of present illness:  Please refer to admission H&P for details but in brief 78 yo who came in with DVT and presumed PE (unable to get V/Q or CTA). Has had a complicated course with fluid overload, acute respiratory failure, AKI with urinary retention and hematuria.   Hospital Course:    #1 acute respiratory failure  Likely multifactorial in nature secondary to probable acute PE, probable community-acquired pneumonia per patchy infiltrate seen on chest x-ray with symptoms of a productive cough ongoing for several weeks- chest x ray on 2/4 showed worsening patchy infiltrate .  Cultures negative. Unable to get CT angiogram secondary to chronic kidney disease. Unable to get VQ scan as patient could not  ay flat.  Patient treated with 9 days of IV vancomycin and Zosyn already. Now satting well on room air and 2 liters with nasal cannula on exertion.   Acute left leg weakness on 2/12  C/o acute left thigh and leg numbness with weakness of left thigh . Distal pulses palpable. Stat MRI ordered but could not tolerate it. CT of lumbar spine w/o contrast shows multilevel spinal spondylosis with moderate spinal stenosis at L3-L4. Mild foraminal and lateral recess stenosis secondary to asymmetric disc bulging. No high-grade foraminal stenosis or nerve root compression identified. Also has incidental finding of 4 cm distal abdominal aortic aneurysm.  . No bowel or urinary symptoms  Symptoms resolved slowly within few hours. Possibly related to pain and asymptomatic now.   hypoxia  Likely multifactorial secondary to probable PE in a patient with left lower extremity DVT and community-acquired pneumonia  Currently improved.   probable PE and left lower leg DVT  Patient  presented with severe hypoxia with sats in 80% requiring a face mask. Patient was tachycardic. Lower extremity Dopplers were positive for left lower extremity DVT. Unable to get CT angiogram secondary to renal function. Heparin was started in the emergency room and a such unable to obtain a hypercoagulable panel. Unable to get VQ scan as patient could not lay flat. 2-D echo with EF of 65-70%. Right ventricle was mildly to moderately dilated. Intraventricular septum was D-shaped with evidence for right ventricular pressure/volume overload which could be consistent with a large PE.  patint now on coumadin. Bridged wit heparin and lovenox initially which were  both held due to hematuria. INR subtherapeutic on current dose of Coumadin. We'll discharge patient on bridging with Lovenox as renal function better. -Recommend to treat for 6 months with anticoagulation   COPD exac  Seen on presentation. Continue nebs and Tapering by mouth  prednisone  acute on chronic kidney disease stage III  On admission creatinine was 1.99. Renal ultrasound with slight thinning of the renal cortices. No acute abnormalities. ACE inhibitor held. On low dose Lasix. Renal fn improving.    Hx of anemia  H&H stable.   leukocytosis  Likely reactive secondary to probable PE , pneumonia and being on steroids . All of labs and exudates  diabetes mellitus  Hemoglobin A1c is 8.4. Increased Lantus to 15 units daily. Continue sliding scale insulin   hypertension  Stable. . didn't current medications  urinary retention with hematuria  -foley placed . Followup with urology as outpatient. (3-4 weeks after discharge with Dr. Isabel Caprice.) Continue Flomax for BPH   AAA  incidental finding on CT with 4 cm in size. Needs outpatient follow up    Consults:  Pulmonary  Neurology  Urology    Procedures:  None     Discharge Exam: Filed Vitals:   08/03/12 2141 08/04/12 0118 08/04/12 0644 08/04/12 0915  BP: 141/72  141/71   Pulse: 88  84   Temp: 98 F (36.7 C)  98.4 F (36.9 C)   TempSrc: Oral  Oral   Resp: 20  18   Height:      Weight:   95.5 kg (210 lb 8.6 oz)   SpO2: 95% 95% 95% 93%   General: Elderly male in no acute distress  HEENT: no pallor, moist mucosa  Cardiovascular: NS1&S2, no murmurs  Respiratory: clear b/l, no wheezing or rhonchi  Abdomen: soft, NT, ND, BS+ foley in place with clear urine EXT: warm, normal tone , power and reflexes over B/L LE. Normal sensations  CNS: AAOX3    Discharge Instructions   Future Appointments Provider Department Dept Phone   08/07/2012 3:00 PM Kalman Shan, MD [facility] [phone]       Medication List    STOP taking these medications       CHOLEST OFF 450 MG Tabs  Generic drug:  Plant Sterols and Stanols     enalapril-hydrochlorothiazide 10-25 MG per tablet  Commonly known as:  VASERETIC     Red Yeast Rice 600 MG Tabs      TAKE these medications        albuterol (2.5 MG/3ML) 0.083% nebulizer solution  Commonly known as:  PROVENTIL  Take 2.5 mg by nebulization every 6 (six) hours as needed. FOR SHORTNESS OF BREATH OR WHEEZING     bisacodyl 5 MG EC tablet  Commonly known as:  DULCOLAX  Take 1 tablet (5 mg total) by mouth daily as needed.     budesonide-formoterol 160-4.5 MCG/ACT inhaler  Commonly known as:  SYMBICORT  Inhale 2 puffs into the lungs 2 (two) times daily.     enoxaparin 100 MG/ML injection  Commonly known as:  LOVENOX  Inject 1 mL (100 mg total) into the skin daily.     Fluticasone-Salmeterol 500-50 MCG/DOSE Aepb  Commonly known as:  ADVAIR  Inhale 1 puff into the lungs every 12 (twelve) hours.     furosemide 40 MG tablet  Commonly known as:  LASIX  Take 1 tablet (40 mg total) by mouth daily.     glimepiride 1 MG tablet  Commonly known as:  AMARYL  Take 1 mg by mouth daily before breakfast.     guaiFENesin 600 MG 12 hr tablet  Commonly known as:  MUCINEX  Take 2 tablets (1,200 mg total) by mouth 2 (two) times daily.     insulin glargine 100 UNIT/ML injection  Commonly known as:  LANTUS  Inject 15 Units into the skin at bedtime.     ipratropium 0.02 % nebulizer solution  Commonly known as:  ATROVENT  Take 2.5 mLs (0.5 mg total) by nebulization every 6 (six) hours.     NIFEdipine 60 MG 24 hr tablet  Commonly known as:  PROCARDIA XL/ADALAT-CC  Take 60 mg by mouth every morning.     oxyCODONE 5 MG immediate release tablet  Commonly known as:  Oxy IR/ROXICODONE  Take 1 tablet (5 mg total) by mouth every 6 (six) hours as needed.     pirbuterol 200 MCG/INH inhaler  Commonly known as:  MAXAIR  Inhale 2 puffs into the lungs 4 (four) times daily as needed. FOR SHORTNESS OF BREATH OR WHEEZING     predniSONE 20 MG tablet  Commonly known as:  DELTASONE  Take 0.5 tablets (10 mg total) by mouth daily with breakfast.     Tamsulosin HCl 0.4 MG Caps  Commonly known as:  FLOMAX  Take 0.4 mg by mouth daily after  supper.     warfarin 5 MG tablet  Commonly known as:  COUMADIN  Take 1 tablet (5 mg total) by mouth one time only at 6 PM.           Follow-up Information   Follow up with [facility], MD In 1 week. (after discharge from SNF. patient needs outpatient follow up regarding his Abdominal aortic aneurysm.)    Contact information:   Dr. Burnell Blanks [address] [place] [address] [phone]        The results of significant diagnostics from this hospitalization (including imaging, microbiology, ancillary and laboratory) are listed below for reference.    Significant Diagnostic Studies: Dg Eye Foreign Body  07/30/2012  *RADIOLOGY REPORT*  Clinical Data: Pre MRI screening  ORBITS FOR FOREIGN BODY - 2 VIEW  Comparison: None  Findings: No radiopaque foreign bodies identified within the orbits.  IMPRESSION: Negative exam for metallic foreign bodies in the orbits.   Original Report Authenticated By: Signa Kell, M.D.    Dg Chest 2 View  07/17/2012  *RADIOLOGY REPORT*  Clinical Data: History of dyspnea and COPD.  CHEST - 2 VIEW  Comparison: None.  Findings: There is mild enlargement cardiac silhouette.  There is rounded prominence of the main pulmonary artery area. Ectasia and nonaneurysmal calcification of the thoracic aorta are seen.  Patchy infiltrative densities and atelectasis are seen in the right lung base. Haziness and increased markings are seen in the left lung base.  This may be associated with epicardial fat pad.  There is flattening of the low-lying diaphragm on lateral image consistent with overall hyperinflation configuration and COPD.  There is osteopenic appearance of bones.  Changes of degenerative disc disease and degenerative spondylosis are present.  IMPRESSION: Overall hyperinflation configuration consistent with COPD. Patchy infiltrative densities and atelectasis are seen in the right lung base. Haziness and increased markings are seen in the left lung  base.  This may be associated with epicardial fat pad. Chronic findings described above.   Original Report Authenticated By: Onalee Hua Call    Dg Hip Complete Left  07/29/2012  *RADIOLOGY REPORT*  Clinical Data: Left hip  pain for 10 years.  No injury.  LEFT HIP - COMPLETE 2+ VIEW  Comparison: None.  Findings: Left hip joint degenerative changes with appearance of subchondral cysts of the left femoral head.  Radiopaque material projects over the left femoral neck region which may represent presence of loose bodies.  Mild degenerative changes lower lumbar spine.  Vascular calcifications.  Foley catheter in place.  IMPRESSION: Left hip joint degenerative changes with appearance of subchondral cysts of the left femoral head.  Radiopaque material projects over the left femoral neck region which may represent presence of loose bodies.   Original Report Authenticated By: Lacy Duverney, M.D.    Ct Head Wo Contrast  07/30/2012  *RADIOLOGY REPORT*  Clinical Data: Weakness, possible stroke.  History of diabetes. History of hypertension.  History of chronic renal disease.  CT HEAD WITHOUT CONTRAST  Technique:  Contiguous axial images were obtained from the base of the skull through the vertex without contrast.  Comparison: None.  Findings: The patient had difficulty remaining motionless for the study.  Images are suboptimal.  Small or subtle lesions could be overlooked.  There is no evidence for acute infarction, intracranial hemorrhage, mass lesion, hydrocephalus, or extra-axial fluid.  Mild atrophy is present.  No visible signs of large vessel infarct.  Hypodensity of the white matter suggests chronic microvascular ischemic change. Remote left periventricular lacunar infarct.  Moderate vascular calcification in the skull base carotid and vertebral segments.  No visible fracture or worrisome osseous lesion.  Visualized sinuses and mastoids clear. Bilateral cataract extraction.  IMPRESSION: Motion degraded scan limits  sensitivity.  Chronic changes as described.  No acute intracranial findings.   Original Report Authenticated By: Davonna Belling, M.D.    Ct Lumbar Spine Wo Contrast  07/30/2012  *RADIOLOGY REPORT*  Clinical Data: Low back pain with acute two left thigh flaccidity. Unable to tolerate MRI.  CT LUMBAR SPINE WITHOUT CONTRAST  Technique:  Multidetector CT imaging of the lumbar spine was performed without intravenous contrast administration. Multiplanar CT image reconstructions were also generated.  Comparison: None.  Findings: There are five lumbar type vertebral bodies.  The alignment is normal.  There is no evidence of fracture or pars defect. The lumbar pedicles are somewhat short on a congenital basis.  There is diffuse aorto iliac atherosclerosis.  The distal abdominal aorta demonstrates a fusiform aneurysm which is incompletely visualized, but measures up to approximately 4.0 cm anterior- posterior.  There is no evidence of retroperitoneal hematoma.  L1-L2:  Mild disc bulging with vacuum phenomenon, facet and ligamentous hypertrophy.  There is mild resulting central stenosis. The foramina are sufficiently patent.  L2-L3: There is discal vacuum phenomenon with annular bulging eccentric to the left.  No definite left L2 nerve root encroachment is seen.  Facet and ligamentous hypertrophy contributes to mild central stenosis.  L3-L4:  There is greater annular disc bulging at this level with associated vacuum phenomenon.  There is moderate facet and ligamentous hypertrophy.  These factors contribute to moderate central stenosis.  Mild inferior foraminal narrowing is present, left greater than right.  L4-L5:  Disc bulging is mildly eccentric to the right.  There is facet and ligamentous hypertrophy.  There is mild central and mild right foraminal stenosis.  L5-S1:  Disc bulging is asymmetric to the right and results in mild right foraminal stenosis.  There is mild facet and ligamentous hypertrophy.  The central canal and  left foramen are patent.  IMPRESSION:  1.  Multilevel spondylosis superimposed on a congenitally small  canal. There is moderate spinal stenosis at L3-L4.  Central stenosis is mild at the L1-L2, L2-L3 and L4-L5 levels. 2.  Mild foraminal and lateral recess stenosis as detailed above secondary to asymmetric disc bulging.  No high-grade foraminal stenosis or nerve root compression identified. 3.  No acute osseous findings.  4.  Distal abdominal aortic aneurysm, incompletely visualized, but measuring approximately 4 cm AP.   Original Report Authenticated By: Carey Bullocks, M.D.    US Renal  07/18/2012  *RADIOLOGY REPORT*  Clinical Data: Acute renal failure.  RENAL/URINARY TRACT ULTRASOUND COMPLETE  Comparison:  None.  Findings:  Right Kidney:  10.2 cm in length.  Diffuse thinning of the renal cortex.  No hydronephrosis.  Left Kidney:  10.4 cm in length.  Diffuse slight thinning of the renal cortex.  1.3 cm simple appearing cyst in the inferior medial aspect of the kidney.  No hydronephrosis.  Bladder:  Empty.  Foley catheter in place.  IMPRESSION: No acute abnormalities.  Slight thinning of the renal cortices.   Original Report Authenticated By: Francene Boyers, M.D.    Dg Chest Port 1 View  07/26/2012  *RADIOLOGY REPORT*  Clinical Data: Short of breath, weakness  PORTABLE CHEST - 1 VIEW  Comparison: Chest radiograph 07/23/2012  Findings: Normal cardiac silhouette.  There are bibasilar air space opacities which are similar to prior.  Upper lungs are clear.  No aggressive osseous lesions  IMPRESSION: Bibasilar opacities concerning for bibasilar pneumonia.   Original Report Authenticated By: Genevive Bi, M.D.    Dg Chest Port 1 View  07/23/2012  *RADIOLOGY REPORT*  Clinical Data: COPD  PORTABLE CHEST - 1 VIEW  Comparison: 07/17/2012  Findings: Increased bibasilar heterogeneous opacities.  Mild cardiomegaly.  No pneumothorax.  Stable aorta. Stable right perihilar patchy density.  IMPRESSION: Increased bibasilar  patchy airspace disease.   Original Report Authenticated By: Jolaine Click, M.D.     Microbiology: Recent Results (from the past 240 hour(s))  CULTURE, EXPECTORATED SPUTUM-ASSESSMENT     Status: None   Collection Time    07/30/12  9:58 PM      Result Value Range Status   Specimen Description SPUTUM   Final   Special Requests NONE   Final   Sputum evaluation     Final   Value: THIS SPECIMEN IS ACCEPTABLE. RESPIRATORY CULTURE REPORT TO FOLLOW.   Report Status 07/30/2012 FINAL   Final  CULTURE, RESPIRATORY (NON-EXPECTORATED)     Status: None   Collection Time    07/30/12  9:58 PM      Result Value Range Status   Specimen Description SPUTUM   Final   Special Requests NONE   Final   Gram Stain     Final   Value: NO WBC SEEN     NO SQUAMOUS EPITHELIAL CELLS SEEN     NO ORGANISMS SEEN   Culture     Final   Value: ABUNDANT CANDIDA ALBICANS     RARE FUNGUS (MOLD) ISOLATED, PROBABLE CONTAMINANT/COLONIZER (SAPROPHYTE). CONTACT MICROBIOLOGY IF FURTHER IDENTIFICATION REQUIRED [phone].   Report Status 08/02/2012 FINAL   Final     Labs: Basic Metabolic Panel:  Recent Labs Lab 07/29/12 0425 07/30/12 1929 08/01/12 0430 08/02/12 0520 08/03/12 0457  NA 133* 136 134* 136 135  K 3.8 3.4* 3.2* 3.1* 3.4*  CL 95* 93* 94* 98 96  CO2 28 33* 30 30 32  GLUCOSE 194* 167* 107* 159* 220*  BUN 56* 48* 41* 35* 35*  CREATININE 2.16* 1.86* 1.70* 1.53* 1.58*  CALCIUM 8.9 8.9 8.4 8.3* 8.1*  MG  --  1.8  --   --   --    Liver Function Tests: No results found for this basename: AST, ALT, ALKPHOS, BILITOT, PROT, ALBUMIN,  in the last 168 hours No results found for this basename: LIPASE, AMYLASE,  in the last 168 hours No results found for this basename: AMMONIA,  in the last 168 hours CBC:  Recent Labs Lab 07/29/12 0425 07/30/12 0443 07/31/12 0504 08/01/12 0430 08/02/12 0520  WBC 19.8* 19.1* 19.5* 18.5* 18.0*  HGB 12.7* 12.2* 11.8* 11.0* 10.6*  HCT 38.7* 36.9* 35.3* 33.3* 32.5*  MCV 94.2  93.9 93.6 94.1 93.4  PLT 226 254 267 237 263   Cardiac Enzymes: No results found for this basename: CKTOTAL, CKMB, CKMBINDEX, TROPONINI,  in the last 168 hours BNP: BNP (last 3 results)  Recent Labs  07/17/12 1315 07/23/12 0445  PROBNP 1312.0* 393.8   CBG:  Recent Labs Lab 08/03/12 1158 08/03/12 1718 08/03/12 2158 08/04/12 0734 08/04/12 1126  GLUCAP 202* 293* 190* 200* 174*       Signed:  Analysia Dungee  [facility] 08/04/2012, 1:34 PM

## 2012-08-05 DIAGNOSIS — R5381 Other malaise: Secondary | ICD-10-CM | POA: Diagnosis not present

## 2012-08-05 DIAGNOSIS — R069 Unspecified abnormalities of breathing: Secondary | ICD-10-CM | POA: Diagnosis not present

## 2012-08-05 DIAGNOSIS — F411 Generalized anxiety disorder: Secondary | ICD-10-CM | POA: Diagnosis not present

## 2012-08-07 ENCOUNTER — Institutional Professional Consult (permissible substitution): Payer: Medicare Other | Admitting: Internal Medicine

## 2012-08-10 DIAGNOSIS — F411 Generalized anxiety disorder: Secondary | ICD-10-CM | POA: Diagnosis not present

## 2012-08-10 DIAGNOSIS — R069 Unspecified abnormalities of breathing: Secondary | ICD-10-CM | POA: Diagnosis not present

## 2012-08-10 DIAGNOSIS — R5381 Other malaise: Secondary | ICD-10-CM | POA: Diagnosis not present

## 2012-08-17 DIAGNOSIS — R509 Fever, unspecified: Secondary | ICD-10-CM | POA: Diagnosis not present

## 2012-08-17 DIAGNOSIS — R069 Unspecified abnormalities of breathing: Secondary | ICD-10-CM | POA: Diagnosis not present

## 2012-08-26 DIAGNOSIS — R310 Gross hematuria: Secondary | ICD-10-CM | POA: Diagnosis not present

## 2012-08-26 DIAGNOSIS — N401 Enlarged prostate with lower urinary tract symptoms: Secondary | ICD-10-CM | POA: Diagnosis not present

## 2012-09-01 DIAGNOSIS — J441 Chronic obstructive pulmonary disease with (acute) exacerbation: Secondary | ICD-10-CM | POA: Diagnosis not present

## 2012-09-01 DIAGNOSIS — I129 Hypertensive chronic kidney disease with stage 1 through stage 4 chronic kidney disease, or unspecified chronic kidney disease: Secondary | ICD-10-CM | POA: Diagnosis not present

## 2012-09-01 DIAGNOSIS — Z794 Long term (current) use of insulin: Secondary | ICD-10-CM | POA: Diagnosis not present

## 2012-09-01 DIAGNOSIS — E119 Type 2 diabetes mellitus without complications: Secondary | ICD-10-CM | POA: Diagnosis not present

## 2012-09-01 DIAGNOSIS — N189 Chronic kidney disease, unspecified: Secondary | ICD-10-CM | POA: Diagnosis not present

## 2012-09-04 DIAGNOSIS — E119 Type 2 diabetes mellitus without complications: Secondary | ICD-10-CM | POA: Diagnosis not present

## 2012-09-04 DIAGNOSIS — N189 Chronic kidney disease, unspecified: Secondary | ICD-10-CM | POA: Diagnosis not present

## 2012-09-04 DIAGNOSIS — J441 Chronic obstructive pulmonary disease with (acute) exacerbation: Secondary | ICD-10-CM | POA: Diagnosis not present

## 2012-09-04 DIAGNOSIS — Z794 Long term (current) use of insulin: Secondary | ICD-10-CM | POA: Diagnosis not present

## 2012-09-04 DIAGNOSIS — I129 Hypertensive chronic kidney disease with stage 1 through stage 4 chronic kidney disease, or unspecified chronic kidney disease: Secondary | ICD-10-CM | POA: Diagnosis not present

## 2012-09-05 DIAGNOSIS — J441 Chronic obstructive pulmonary disease with (acute) exacerbation: Secondary | ICD-10-CM | POA: Diagnosis not present

## 2012-09-05 DIAGNOSIS — Z794 Long term (current) use of insulin: Secondary | ICD-10-CM | POA: Diagnosis not present

## 2012-09-05 DIAGNOSIS — I129 Hypertensive chronic kidney disease with stage 1 through stage 4 chronic kidney disease, or unspecified chronic kidney disease: Secondary | ICD-10-CM | POA: Diagnosis not present

## 2012-09-05 DIAGNOSIS — N189 Chronic kidney disease, unspecified: Secondary | ICD-10-CM | POA: Diagnosis not present

## 2012-09-05 DIAGNOSIS — E119 Type 2 diabetes mellitus without complications: Secondary | ICD-10-CM | POA: Diagnosis not present

## 2012-09-08 DIAGNOSIS — N189 Chronic kidney disease, unspecified: Secondary | ICD-10-CM | POA: Diagnosis not present

## 2012-09-08 DIAGNOSIS — J441 Chronic obstructive pulmonary disease with (acute) exacerbation: Secondary | ICD-10-CM | POA: Diagnosis not present

## 2012-09-08 DIAGNOSIS — E119 Type 2 diabetes mellitus without complications: Secondary | ICD-10-CM | POA: Diagnosis not present

## 2012-09-08 DIAGNOSIS — I129 Hypertensive chronic kidney disease with stage 1 through stage 4 chronic kidney disease, or unspecified chronic kidney disease: Secondary | ICD-10-CM | POA: Diagnosis not present

## 2012-09-08 DIAGNOSIS — Z794 Long term (current) use of insulin: Secondary | ICD-10-CM | POA: Diagnosis not present

## 2012-09-09 DIAGNOSIS — I129 Hypertensive chronic kidney disease with stage 1 through stage 4 chronic kidney disease, or unspecified chronic kidney disease: Secondary | ICD-10-CM | POA: Diagnosis not present

## 2012-09-09 DIAGNOSIS — N189 Chronic kidney disease, unspecified: Secondary | ICD-10-CM | POA: Diagnosis not present

## 2012-09-09 DIAGNOSIS — E119 Type 2 diabetes mellitus without complications: Secondary | ICD-10-CM | POA: Diagnosis not present

## 2012-09-09 DIAGNOSIS — Z794 Long term (current) use of insulin: Secondary | ICD-10-CM | POA: Diagnosis not present

## 2012-09-09 DIAGNOSIS — J441 Chronic obstructive pulmonary disease with (acute) exacerbation: Secondary | ICD-10-CM | POA: Diagnosis not present

## 2012-09-10 DIAGNOSIS — J449 Chronic obstructive pulmonary disease, unspecified: Secondary | ICD-10-CM | POA: Diagnosis not present

## 2012-09-10 DIAGNOSIS — I2699 Other pulmonary embolism without acute cor pulmonale: Secondary | ICD-10-CM | POA: Diagnosis not present

## 2012-09-10 DIAGNOSIS — R791 Abnormal coagulation profile: Secondary | ICD-10-CM | POA: Diagnosis not present

## 2012-09-10 DIAGNOSIS — Z79899 Other long term (current) drug therapy: Secondary | ICD-10-CM | POA: Diagnosis not present

## 2012-09-11 DIAGNOSIS — N401 Enlarged prostate with lower urinary tract symptoms: Secondary | ICD-10-CM | POA: Diagnosis not present

## 2012-09-11 DIAGNOSIS — J441 Chronic obstructive pulmonary disease with (acute) exacerbation: Secondary | ICD-10-CM | POA: Diagnosis not present

## 2012-09-11 DIAGNOSIS — N189 Chronic kidney disease, unspecified: Secondary | ICD-10-CM | POA: Diagnosis not present

## 2012-09-11 DIAGNOSIS — R339 Retention of urine, unspecified: Secondary | ICD-10-CM | POA: Diagnosis not present

## 2012-09-11 DIAGNOSIS — N1330 Unspecified hydronephrosis: Secondary | ICD-10-CM | POA: Diagnosis not present

## 2012-09-11 DIAGNOSIS — E119 Type 2 diabetes mellitus without complications: Secondary | ICD-10-CM | POA: Diagnosis not present

## 2012-09-11 DIAGNOSIS — R82998 Other abnormal findings in urine: Secondary | ICD-10-CM | POA: Diagnosis not present

## 2012-09-11 DIAGNOSIS — Z794 Long term (current) use of insulin: Secondary | ICD-10-CM | POA: Diagnosis not present

## 2012-09-11 DIAGNOSIS — I129 Hypertensive chronic kidney disease with stage 1 through stage 4 chronic kidney disease, or unspecified chronic kidney disease: Secondary | ICD-10-CM | POA: Diagnosis not present

## 2012-09-12 DIAGNOSIS — Z794 Long term (current) use of insulin: Secondary | ICD-10-CM | POA: Diagnosis not present

## 2012-09-12 DIAGNOSIS — E119 Type 2 diabetes mellitus without complications: Secondary | ICD-10-CM | POA: Diagnosis not present

## 2012-09-12 DIAGNOSIS — N189 Chronic kidney disease, unspecified: Secondary | ICD-10-CM | POA: Diagnosis not present

## 2012-09-12 DIAGNOSIS — J441 Chronic obstructive pulmonary disease with (acute) exacerbation: Secondary | ICD-10-CM | POA: Diagnosis not present

## 2012-09-12 DIAGNOSIS — I129 Hypertensive chronic kidney disease with stage 1 through stage 4 chronic kidney disease, or unspecified chronic kidney disease: Secondary | ICD-10-CM | POA: Diagnosis not present

## 2012-09-15 DIAGNOSIS — Z794 Long term (current) use of insulin: Secondary | ICD-10-CM | POA: Diagnosis not present

## 2012-09-15 DIAGNOSIS — N189 Chronic kidney disease, unspecified: Secondary | ICD-10-CM | POA: Diagnosis not present

## 2012-09-15 DIAGNOSIS — J441 Chronic obstructive pulmonary disease with (acute) exacerbation: Secondary | ICD-10-CM | POA: Diagnosis not present

## 2012-09-15 DIAGNOSIS — I129 Hypertensive chronic kidney disease with stage 1 through stage 4 chronic kidney disease, or unspecified chronic kidney disease: Secondary | ICD-10-CM | POA: Diagnosis not present

## 2012-09-15 DIAGNOSIS — E119 Type 2 diabetes mellitus without complications: Secondary | ICD-10-CM | POA: Diagnosis not present

## 2012-09-17 DIAGNOSIS — J441 Chronic obstructive pulmonary disease with (acute) exacerbation: Secondary | ICD-10-CM | POA: Diagnosis not present

## 2012-09-17 DIAGNOSIS — I129 Hypertensive chronic kidney disease with stage 1 through stage 4 chronic kidney disease, or unspecified chronic kidney disease: Secondary | ICD-10-CM | POA: Diagnosis not present

## 2012-09-17 DIAGNOSIS — Z794 Long term (current) use of insulin: Secondary | ICD-10-CM | POA: Diagnosis not present

## 2012-09-17 DIAGNOSIS — N189 Chronic kidney disease, unspecified: Secondary | ICD-10-CM | POA: Diagnosis not present

## 2012-09-17 DIAGNOSIS — E119 Type 2 diabetes mellitus without complications: Secondary | ICD-10-CM | POA: Diagnosis not present

## 2012-09-18 DIAGNOSIS — N189 Chronic kidney disease, unspecified: Secondary | ICD-10-CM | POA: Diagnosis not present

## 2012-09-18 DIAGNOSIS — J441 Chronic obstructive pulmonary disease with (acute) exacerbation: Secondary | ICD-10-CM | POA: Diagnosis not present

## 2012-09-18 DIAGNOSIS — Z794 Long term (current) use of insulin: Secondary | ICD-10-CM | POA: Diagnosis not present

## 2012-09-18 DIAGNOSIS — E119 Type 2 diabetes mellitus without complications: Secondary | ICD-10-CM | POA: Diagnosis not present

## 2012-09-18 DIAGNOSIS — I129 Hypertensive chronic kidney disease with stage 1 through stage 4 chronic kidney disease, or unspecified chronic kidney disease: Secondary | ICD-10-CM | POA: Diagnosis not present

## 2012-09-23 DIAGNOSIS — J441 Chronic obstructive pulmonary disease with (acute) exacerbation: Secondary | ICD-10-CM | POA: Diagnosis not present

## 2012-09-23 DIAGNOSIS — E119 Type 2 diabetes mellitus without complications: Secondary | ICD-10-CM | POA: Diagnosis not present

## 2012-09-23 DIAGNOSIS — I129 Hypertensive chronic kidney disease with stage 1 through stage 4 chronic kidney disease, or unspecified chronic kidney disease: Secondary | ICD-10-CM | POA: Diagnosis not present

## 2012-09-23 DIAGNOSIS — Z794 Long term (current) use of insulin: Secondary | ICD-10-CM | POA: Diagnosis not present

## 2012-09-23 DIAGNOSIS — N189 Chronic kidney disease, unspecified: Secondary | ICD-10-CM | POA: Diagnosis not present

## 2012-09-24 DIAGNOSIS — J441 Chronic obstructive pulmonary disease with (acute) exacerbation: Secondary | ICD-10-CM | POA: Diagnosis not present

## 2012-09-24 DIAGNOSIS — Z794 Long term (current) use of insulin: Secondary | ICD-10-CM | POA: Diagnosis not present

## 2012-09-24 DIAGNOSIS — I129 Hypertensive chronic kidney disease with stage 1 through stage 4 chronic kidney disease, or unspecified chronic kidney disease: Secondary | ICD-10-CM | POA: Diagnosis not present

## 2012-09-24 DIAGNOSIS — N189 Chronic kidney disease, unspecified: Secondary | ICD-10-CM | POA: Diagnosis not present

## 2012-09-24 DIAGNOSIS — E119 Type 2 diabetes mellitus without complications: Secondary | ICD-10-CM | POA: Diagnosis not present

## 2012-09-26 DIAGNOSIS — J441 Chronic obstructive pulmonary disease with (acute) exacerbation: Secondary | ICD-10-CM | POA: Diagnosis not present

## 2012-09-26 DIAGNOSIS — E119 Type 2 diabetes mellitus without complications: Secondary | ICD-10-CM | POA: Diagnosis not present

## 2012-09-26 DIAGNOSIS — I129 Hypertensive chronic kidney disease with stage 1 through stage 4 chronic kidney disease, or unspecified chronic kidney disease: Secondary | ICD-10-CM | POA: Diagnosis not present

## 2012-09-26 DIAGNOSIS — Z794 Long term (current) use of insulin: Secondary | ICD-10-CM | POA: Diagnosis not present

## 2012-09-26 DIAGNOSIS — N189 Chronic kidney disease, unspecified: Secondary | ICD-10-CM | POA: Diagnosis not present

## 2012-09-29 DIAGNOSIS — I129 Hypertensive chronic kidney disease with stage 1 through stage 4 chronic kidney disease, or unspecified chronic kidney disease: Secondary | ICD-10-CM | POA: Diagnosis not present

## 2012-09-29 DIAGNOSIS — Z794 Long term (current) use of insulin: Secondary | ICD-10-CM | POA: Diagnosis not present

## 2012-09-29 DIAGNOSIS — N189 Chronic kidney disease, unspecified: Secondary | ICD-10-CM | POA: Diagnosis not present

## 2012-09-29 DIAGNOSIS — E119 Type 2 diabetes mellitus without complications: Secondary | ICD-10-CM | POA: Diagnosis not present

## 2012-09-29 DIAGNOSIS — J441 Chronic obstructive pulmonary disease with (acute) exacerbation: Secondary | ICD-10-CM | POA: Diagnosis not present

## 2012-10-02 DIAGNOSIS — N401 Enlarged prostate with lower urinary tract symptoms: Secondary | ICD-10-CM | POA: Diagnosis not present

## 2012-10-02 DIAGNOSIS — N189 Chronic kidney disease, unspecified: Secondary | ICD-10-CM | POA: Diagnosis not present

## 2012-10-02 DIAGNOSIS — R339 Retention of urine, unspecified: Secondary | ICD-10-CM | POA: Diagnosis not present

## 2012-10-02 DIAGNOSIS — J441 Chronic obstructive pulmonary disease with (acute) exacerbation: Secondary | ICD-10-CM | POA: Diagnosis not present

## 2012-10-02 DIAGNOSIS — Z794 Long term (current) use of insulin: Secondary | ICD-10-CM | POA: Diagnosis not present

## 2012-10-02 DIAGNOSIS — E119 Type 2 diabetes mellitus without complications: Secondary | ICD-10-CM | POA: Diagnosis not present

## 2012-10-02 DIAGNOSIS — I129 Hypertensive chronic kidney disease with stage 1 through stage 4 chronic kidney disease, or unspecified chronic kidney disease: Secondary | ICD-10-CM | POA: Diagnosis not present

## 2012-10-03 DIAGNOSIS — N189 Chronic kidney disease, unspecified: Secondary | ICD-10-CM | POA: Diagnosis not present

## 2012-10-03 DIAGNOSIS — I129 Hypertensive chronic kidney disease with stage 1 through stage 4 chronic kidney disease, or unspecified chronic kidney disease: Secondary | ICD-10-CM | POA: Diagnosis not present

## 2012-10-03 DIAGNOSIS — Z794 Long term (current) use of insulin: Secondary | ICD-10-CM | POA: Diagnosis not present

## 2012-10-03 DIAGNOSIS — J441 Chronic obstructive pulmonary disease with (acute) exacerbation: Secondary | ICD-10-CM | POA: Diagnosis not present

## 2012-10-03 DIAGNOSIS — E119 Type 2 diabetes mellitus without complications: Secondary | ICD-10-CM | POA: Diagnosis not present

## 2012-10-07 DIAGNOSIS — N189 Chronic kidney disease, unspecified: Secondary | ICD-10-CM | POA: Diagnosis not present

## 2012-10-07 DIAGNOSIS — Z794 Long term (current) use of insulin: Secondary | ICD-10-CM | POA: Diagnosis not present

## 2012-10-07 DIAGNOSIS — J441 Chronic obstructive pulmonary disease with (acute) exacerbation: Secondary | ICD-10-CM | POA: Diagnosis not present

## 2012-10-07 DIAGNOSIS — I129 Hypertensive chronic kidney disease with stage 1 through stage 4 chronic kidney disease, or unspecified chronic kidney disease: Secondary | ICD-10-CM | POA: Diagnosis not present

## 2012-10-07 DIAGNOSIS — E119 Type 2 diabetes mellitus without complications: Secondary | ICD-10-CM | POA: Diagnosis not present

## 2012-10-09 DIAGNOSIS — J441 Chronic obstructive pulmonary disease with (acute) exacerbation: Secondary | ICD-10-CM | POA: Diagnosis not present

## 2012-10-09 DIAGNOSIS — N189 Chronic kidney disease, unspecified: Secondary | ICD-10-CM | POA: Diagnosis not present

## 2012-10-09 DIAGNOSIS — E119 Type 2 diabetes mellitus without complications: Secondary | ICD-10-CM | POA: Diagnosis not present

## 2012-10-09 DIAGNOSIS — I129 Hypertensive chronic kidney disease with stage 1 through stage 4 chronic kidney disease, or unspecified chronic kidney disease: Secondary | ICD-10-CM | POA: Diagnosis not present

## 2012-10-09 DIAGNOSIS — Z794 Long term (current) use of insulin: Secondary | ICD-10-CM | POA: Diagnosis not present

## 2012-10-11 DIAGNOSIS — N189 Chronic kidney disease, unspecified: Secondary | ICD-10-CM | POA: Diagnosis not present

## 2012-10-11 DIAGNOSIS — E119 Type 2 diabetes mellitus without complications: Secondary | ICD-10-CM | POA: Diagnosis not present

## 2012-10-11 DIAGNOSIS — J441 Chronic obstructive pulmonary disease with (acute) exacerbation: Secondary | ICD-10-CM | POA: Diagnosis not present

## 2012-10-11 DIAGNOSIS — I129 Hypertensive chronic kidney disease with stage 1 through stage 4 chronic kidney disease, or unspecified chronic kidney disease: Secondary | ICD-10-CM | POA: Diagnosis not present

## 2012-10-11 DIAGNOSIS — Z794 Long term (current) use of insulin: Secondary | ICD-10-CM | POA: Diagnosis not present

## 2012-10-14 DIAGNOSIS — J441 Chronic obstructive pulmonary disease with (acute) exacerbation: Secondary | ICD-10-CM | POA: Diagnosis not present

## 2012-10-14 DIAGNOSIS — Z794 Long term (current) use of insulin: Secondary | ICD-10-CM | POA: Diagnosis not present

## 2012-10-14 DIAGNOSIS — E119 Type 2 diabetes mellitus without complications: Secondary | ICD-10-CM | POA: Diagnosis not present

## 2012-10-14 DIAGNOSIS — I129 Hypertensive chronic kidney disease with stage 1 through stage 4 chronic kidney disease, or unspecified chronic kidney disease: Secondary | ICD-10-CM | POA: Diagnosis not present

## 2012-10-14 DIAGNOSIS — N189 Chronic kidney disease, unspecified: Secondary | ICD-10-CM | POA: Diagnosis not present

## 2012-10-15 DIAGNOSIS — N189 Chronic kidney disease, unspecified: Secondary | ICD-10-CM | POA: Diagnosis not present

## 2012-10-15 DIAGNOSIS — Z794 Long term (current) use of insulin: Secondary | ICD-10-CM | POA: Diagnosis not present

## 2012-10-15 DIAGNOSIS — J441 Chronic obstructive pulmonary disease with (acute) exacerbation: Secondary | ICD-10-CM | POA: Diagnosis not present

## 2012-10-15 DIAGNOSIS — E119 Type 2 diabetes mellitus without complications: Secondary | ICD-10-CM | POA: Diagnosis not present

## 2012-10-15 DIAGNOSIS — I129 Hypertensive chronic kidney disease with stage 1 through stage 4 chronic kidney disease, or unspecified chronic kidney disease: Secondary | ICD-10-CM | POA: Diagnosis not present

## 2012-10-22 DIAGNOSIS — E119 Type 2 diabetes mellitus without complications: Secondary | ICD-10-CM | POA: Diagnosis not present

## 2012-10-22 DIAGNOSIS — J441 Chronic obstructive pulmonary disease with (acute) exacerbation: Secondary | ICD-10-CM | POA: Diagnosis not present

## 2012-10-22 DIAGNOSIS — Z794 Long term (current) use of insulin: Secondary | ICD-10-CM | POA: Diagnosis not present

## 2012-10-22 DIAGNOSIS — I129 Hypertensive chronic kidney disease with stage 1 through stage 4 chronic kidney disease, or unspecified chronic kidney disease: Secondary | ICD-10-CM | POA: Diagnosis not present

## 2012-10-22 DIAGNOSIS — N189 Chronic kidney disease, unspecified: Secondary | ICD-10-CM | POA: Diagnosis not present

## 2012-10-29 DIAGNOSIS — E119 Type 2 diabetes mellitus without complications: Secondary | ICD-10-CM | POA: Diagnosis not present

## 2012-10-29 DIAGNOSIS — Z794 Long term (current) use of insulin: Secondary | ICD-10-CM | POA: Diagnosis not present

## 2012-10-29 DIAGNOSIS — J441 Chronic obstructive pulmonary disease with (acute) exacerbation: Secondary | ICD-10-CM | POA: Diagnosis not present

## 2012-10-29 DIAGNOSIS — N189 Chronic kidney disease, unspecified: Secondary | ICD-10-CM | POA: Diagnosis not present

## 2012-10-29 DIAGNOSIS — I129 Hypertensive chronic kidney disease with stage 1 through stage 4 chronic kidney disease, or unspecified chronic kidney disease: Secondary | ICD-10-CM | POA: Diagnosis not present

## 2013-06-01 DIAGNOSIS — Z961 Presence of intraocular lens: Secondary | ICD-10-CM | POA: Diagnosis not present

## 2013-06-01 DIAGNOSIS — E119 Type 2 diabetes mellitus without complications: Secondary | ICD-10-CM | POA: Diagnosis not present

## 2013-06-01 DIAGNOSIS — Z794 Long term (current) use of insulin: Secondary | ICD-10-CM | POA: Diagnosis not present

## 2013-09-17 DIAGNOSIS — IMO0001 Reserved for inherently not codable concepts without codable children: Secondary | ICD-10-CM | POA: Diagnosis not present

## 2013-09-17 DIAGNOSIS — E7800 Pure hypercholesterolemia, unspecified: Secondary | ICD-10-CM | POA: Diagnosis not present

## 2013-09-22 DIAGNOSIS — N183 Chronic kidney disease, stage 3 unspecified: Secondary | ICD-10-CM | POA: Diagnosis not present

## 2013-09-22 DIAGNOSIS — I10 Essential (primary) hypertension: Secondary | ICD-10-CM | POA: Diagnosis not present

## 2013-09-22 DIAGNOSIS — Z1331 Encounter for screening for depression: Secondary | ICD-10-CM | POA: Diagnosis not present

## 2013-09-22 DIAGNOSIS — IMO0001 Reserved for inherently not codable concepts without codable children: Secondary | ICD-10-CM | POA: Diagnosis not present

## 2013-09-22 DIAGNOSIS — Z9181 History of falling: Secondary | ICD-10-CM | POA: Diagnosis not present

## 2013-09-22 DIAGNOSIS — J449 Chronic obstructive pulmonary disease, unspecified: Secondary | ICD-10-CM | POA: Diagnosis not present

## 2013-12-22 IMAGING — CR DG CHEST 1V PORT
1 series · 1 of 1 positions shown · non-contrast
Comparison: Chest radiograph 07/23/2012

CLINICAL DATA: Short of breath, weakness

PORTABLE CHEST - 1 VIEW

[AP]
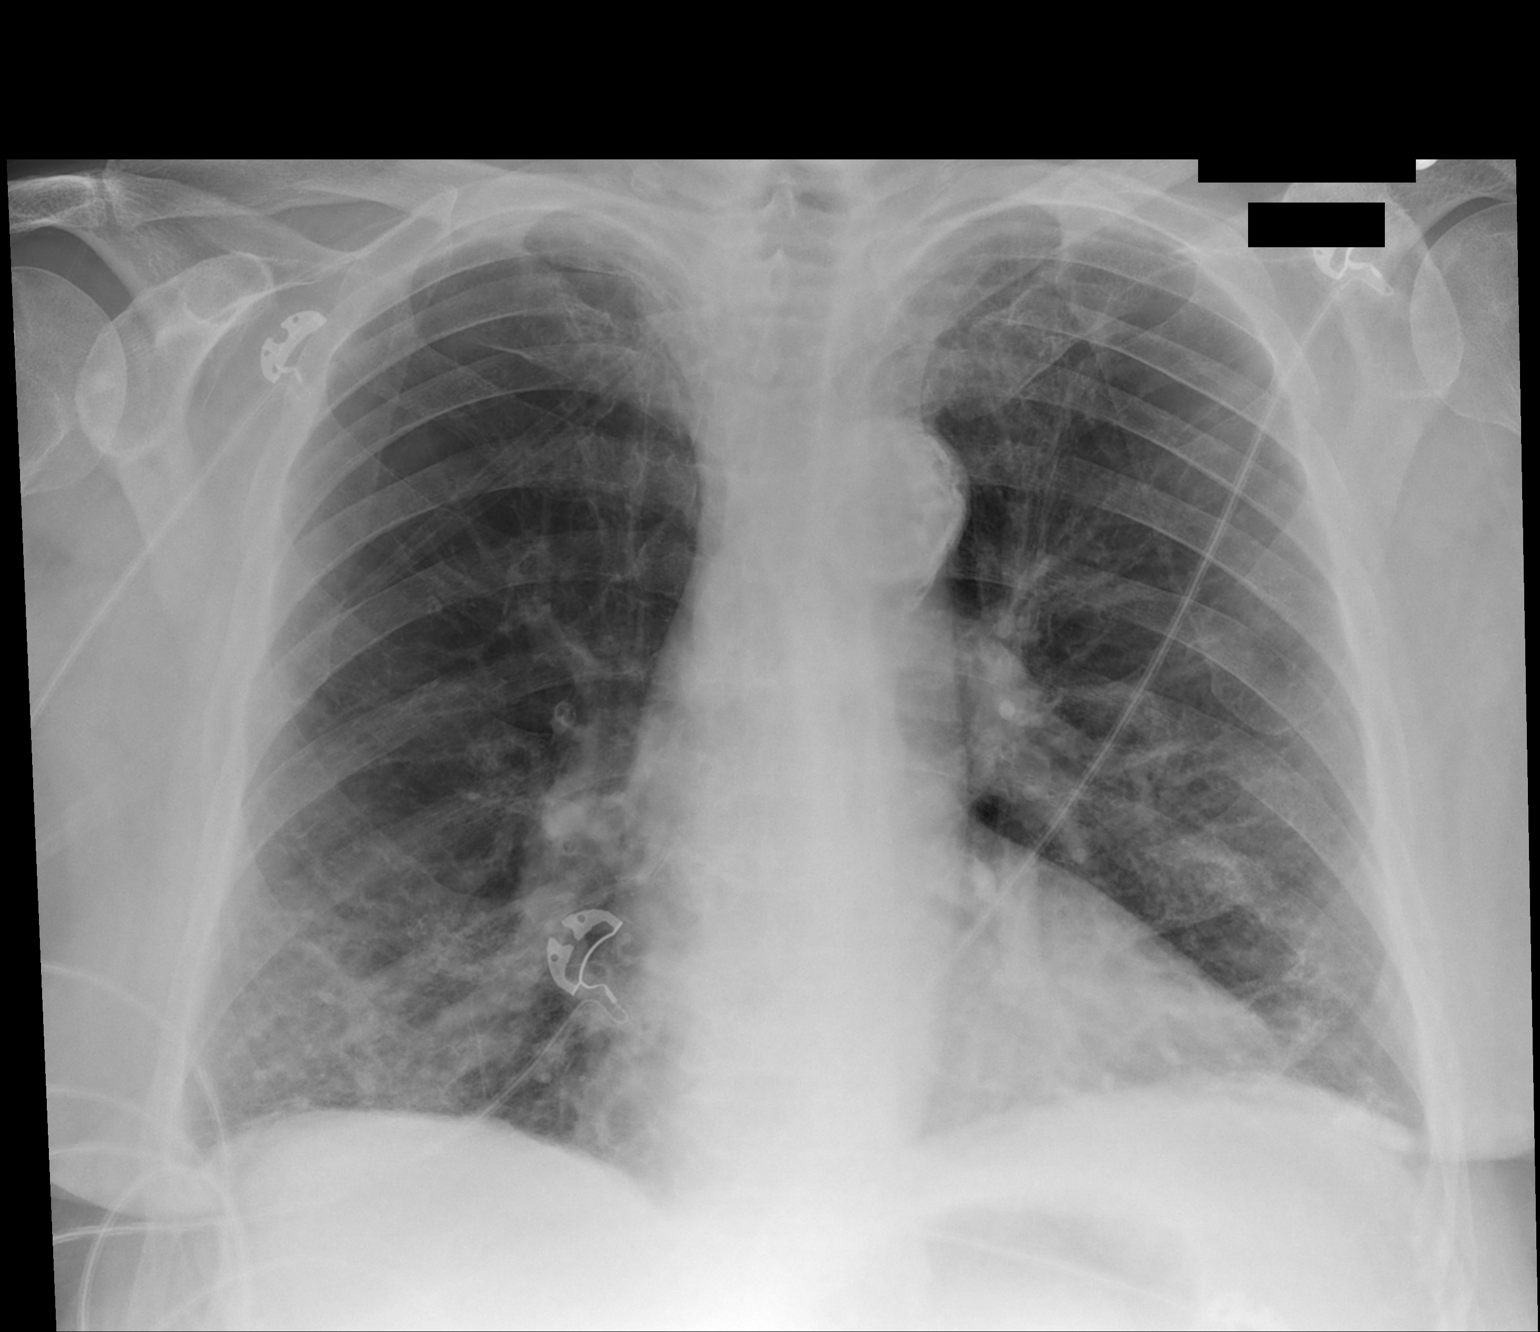

[1 of 1 positions shown; findings below may reference images not displayed]

FINDINGS: Normal cardiac silhouette.  There are bibasilar air space
opacities which are similar to prior.  Upper lungs are clear.  No
aggressive osseous lesions
IMPRESSION: Bibasilar opacities concerning for bibasilar pneumonia.

## 2013-12-23 DIAGNOSIS — IMO0001 Reserved for inherently not codable concepts without codable children: Secondary | ICD-10-CM | POA: Diagnosis not present

## 2013-12-23 DIAGNOSIS — E7800 Pure hypercholesterolemia, unspecified: Secondary | ICD-10-CM | POA: Diagnosis not present

## 2013-12-23 DIAGNOSIS — N183 Chronic kidney disease, stage 3 unspecified: Secondary | ICD-10-CM | POA: Diagnosis not present

## 2013-12-25 DIAGNOSIS — N183 Chronic kidney disease, stage 3 unspecified: Secondary | ICD-10-CM | POA: Diagnosis not present

## 2013-12-25 DIAGNOSIS — IMO0001 Reserved for inherently not codable concepts without codable children: Secondary | ICD-10-CM | POA: Diagnosis not present

## 2013-12-25 DIAGNOSIS — Z23 Encounter for immunization: Secondary | ICD-10-CM | POA: Diagnosis not present

## 2013-12-25 DIAGNOSIS — J449 Chronic obstructive pulmonary disease, unspecified: Secondary | ICD-10-CM | POA: Diagnosis not present

## 2013-12-25 DIAGNOSIS — E7800 Pure hypercholesterolemia, unspecified: Secondary | ICD-10-CM | POA: Diagnosis not present

## 2013-12-25 DIAGNOSIS — I10 Essential (primary) hypertension: Secondary | ICD-10-CM | POA: Diagnosis not present

## 2013-12-25 IMAGING — CR DG HIP (WITH OR WITHOUT PELVIS) 2-3V*L*
3 series · 3 of 3 positions shown · non-contrast
Comparison: None.

CLINICAL DATA: Left hip pain for 10 years.  No injury.

LEFT HIP - COMPLETE 2+ VIEW

[t pelvis a.p.]
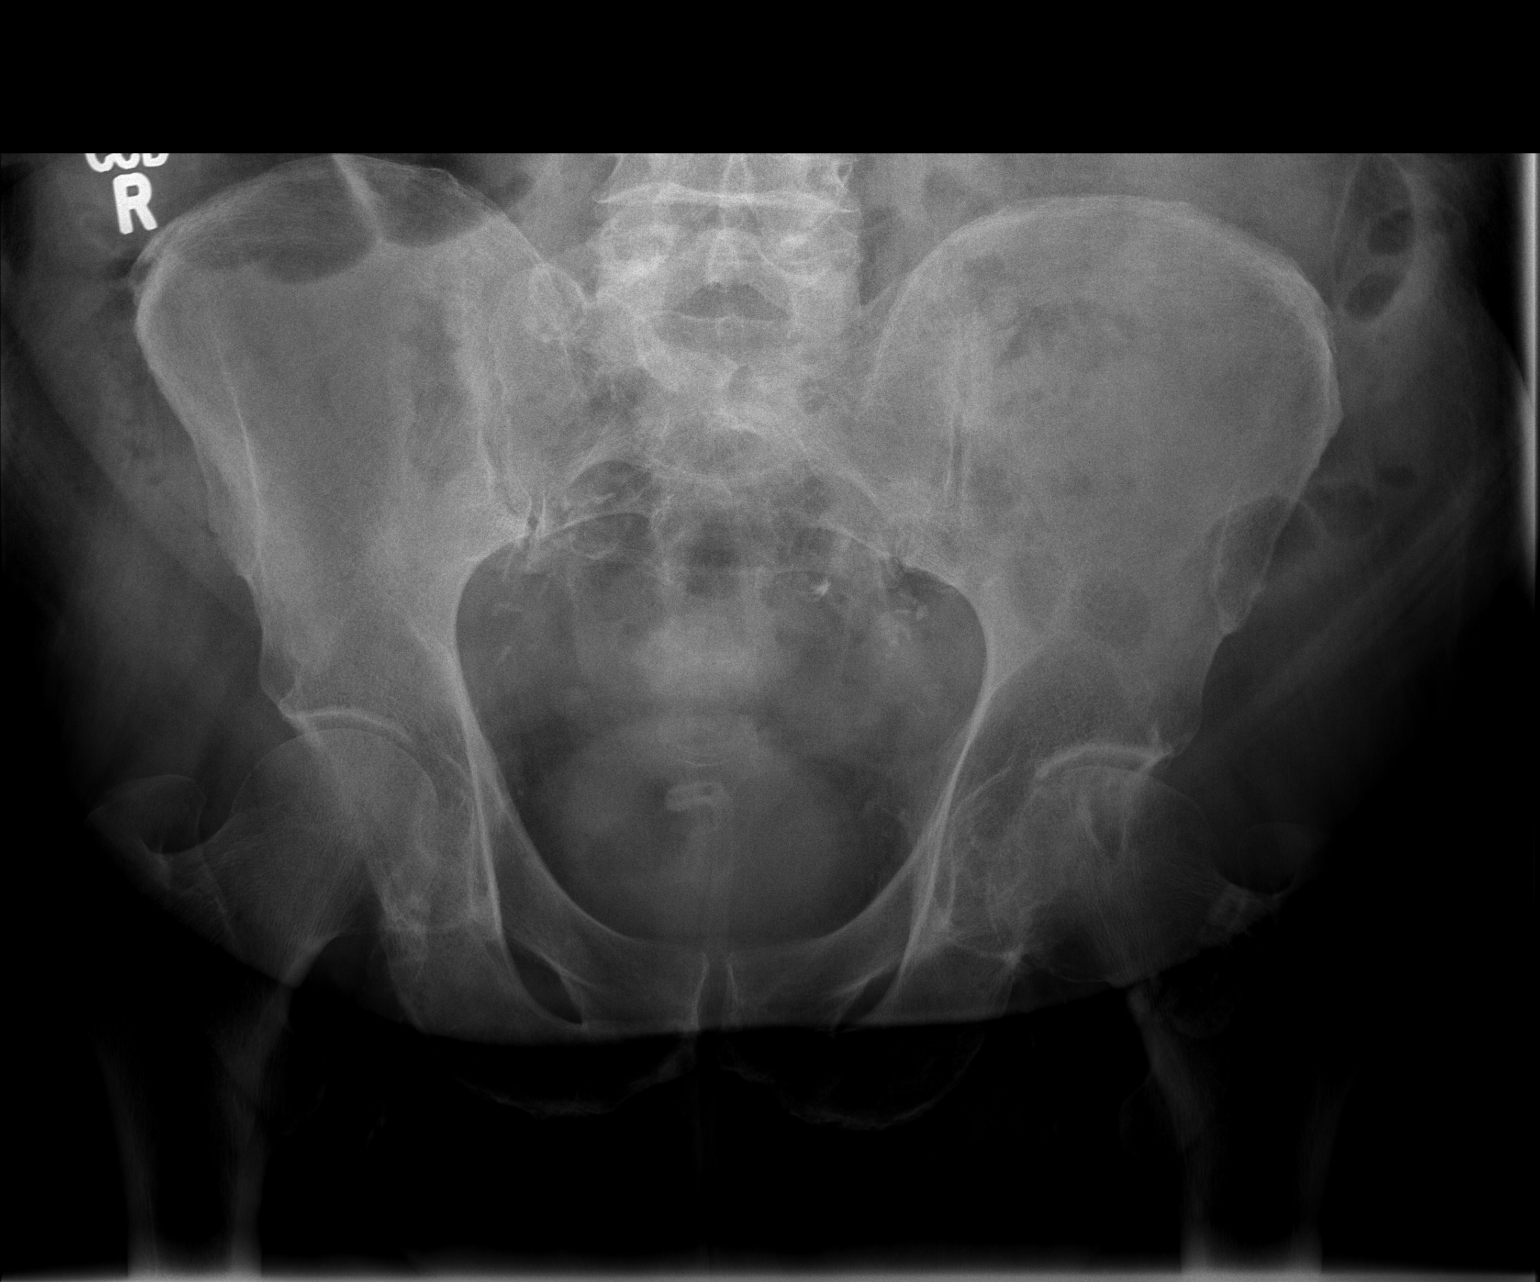

[t hip ap left]
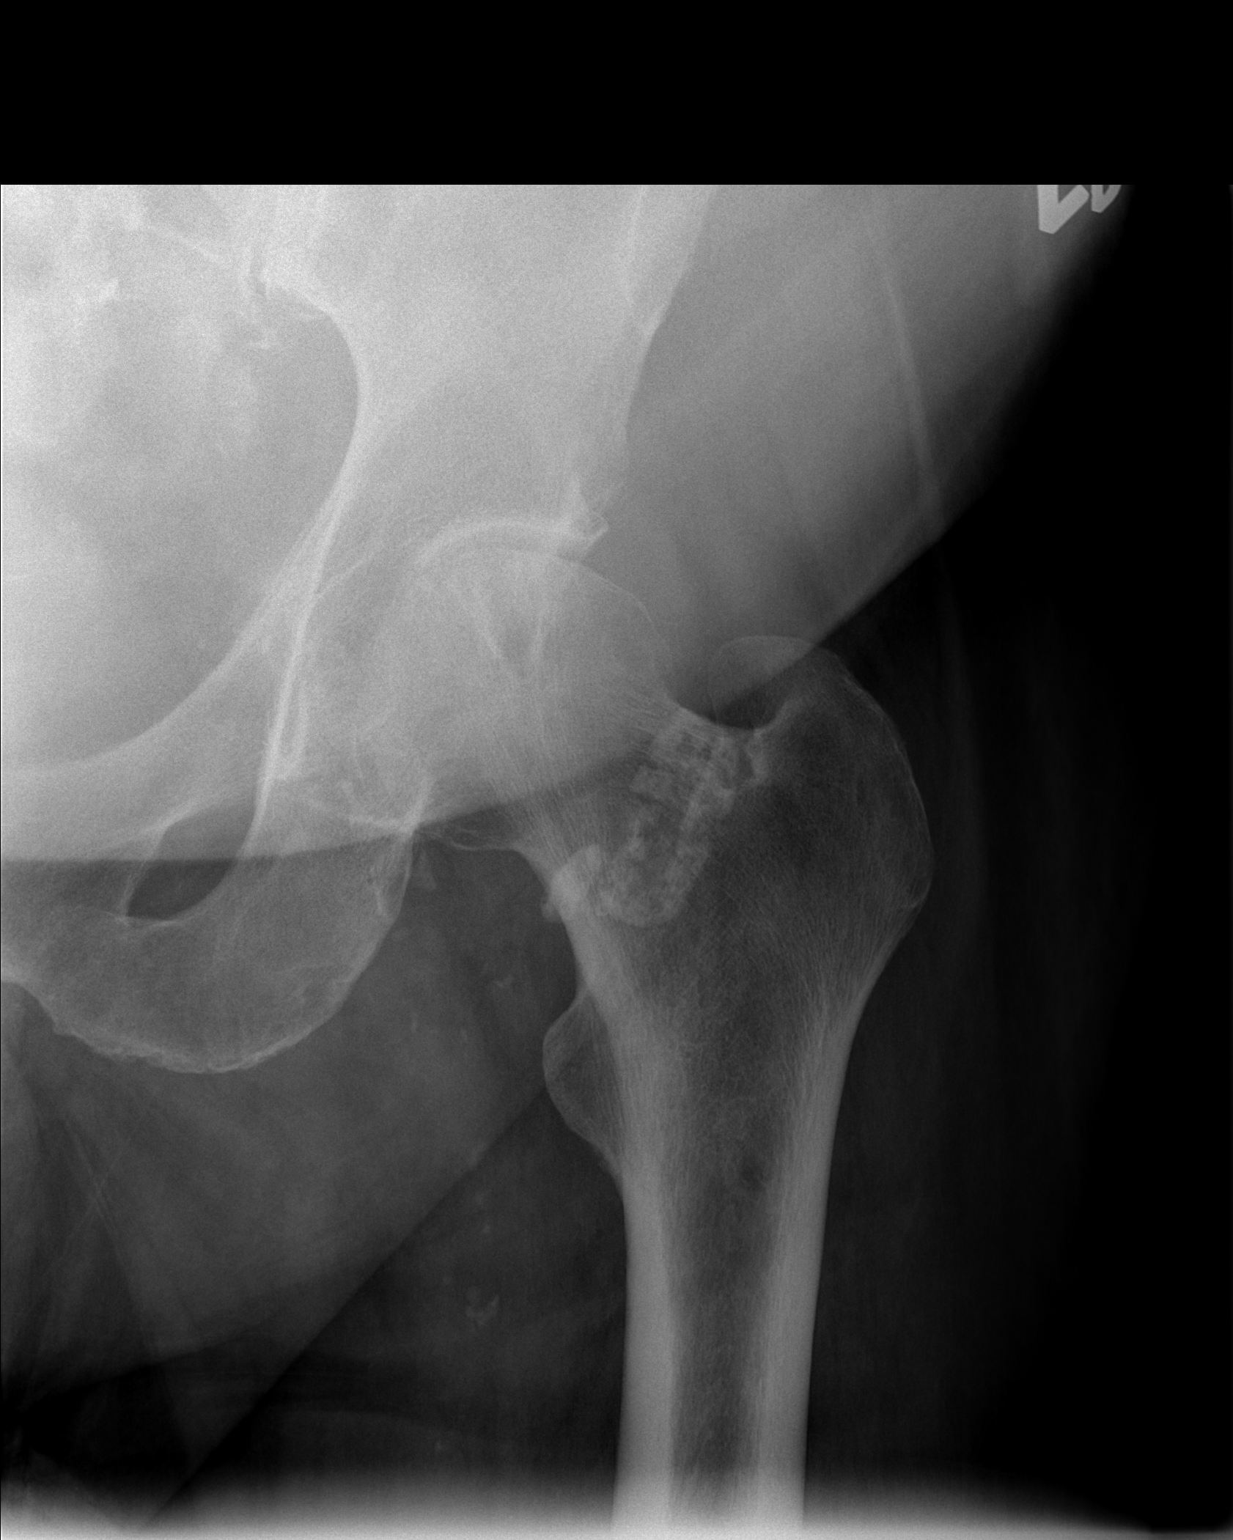

[t hip frog leg left]
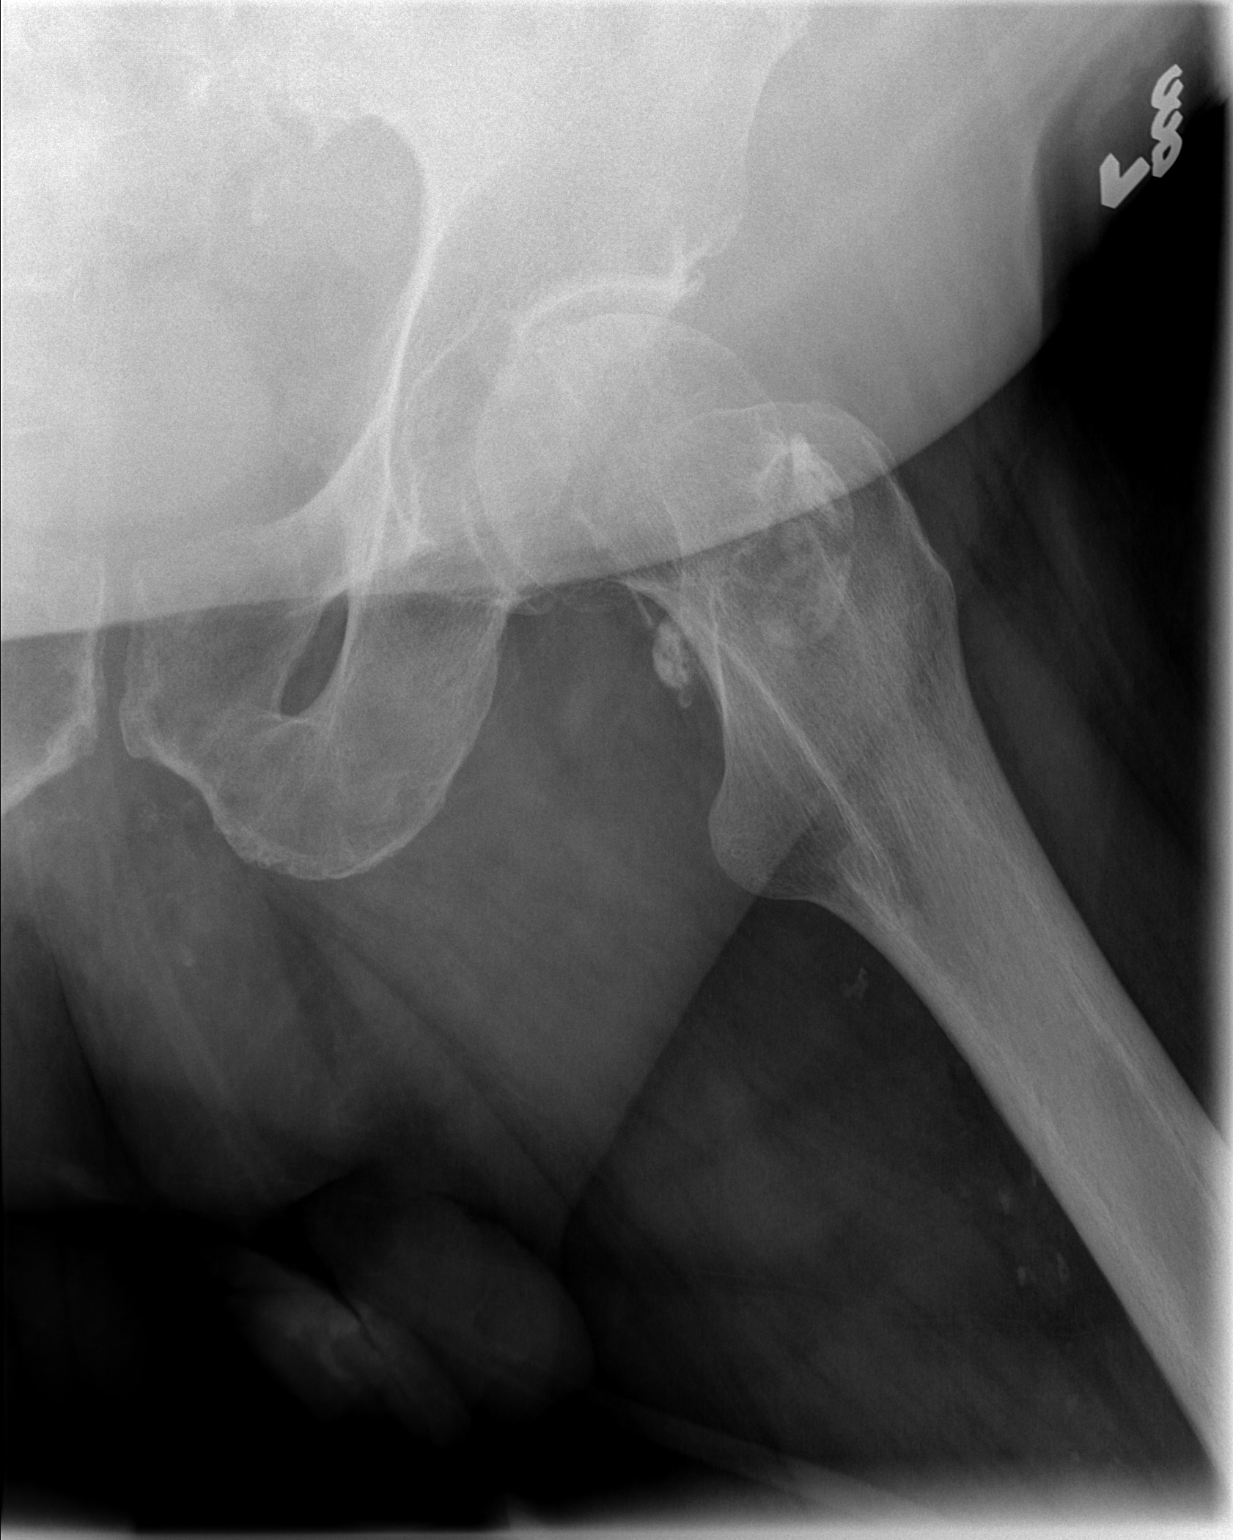

[3 of 3 positions shown; findings below may reference images not displayed]

FINDINGS: Left hip joint degenerative changes with appearance of
subchondral cysts of the left femoral head.  Radiopaque material
projects over the left femoral neck region which may represent
presence of loose bodies.

Mild degenerative changes lower lumbar spine.

Vascular calcifications.

Foley catheter in place.
IMPRESSION: Left hip joint degenerative changes with appearance of subchondral
cysts of the left femoral head.  Radiopaque material projects over
the left femoral neck region which may represent presence of loose
bodies.

## 2013-12-26 IMAGING — CT CT L SPINE W/O CM
4 of 6 series · 16 of 33 positions shown, 18 images · non-contrast
Comparison: None.

CLINICAL DATA: Low back pain with acute two left thigh flaccidity.
Unable to tolerate MRI.

CT LUMBAR SPINE WITHOUT CONTRAST
TECHNIQUE: Multidetector CT imaging of the lumbar spine was
performed without intravenous contrast administration. Multiplanar
CT image reconstructions were also generated.

[Series 3: l-spine 3.0 b30s · axial · 0.29mm/px · z∈[+816,+978]mm · 7 of 72 slices shown, 9 images (1 of 2)]
[im 9/72  soft-tissue]
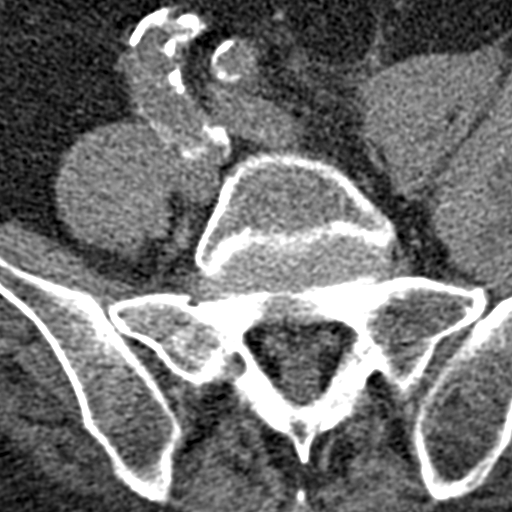
[im 9/72  bone]
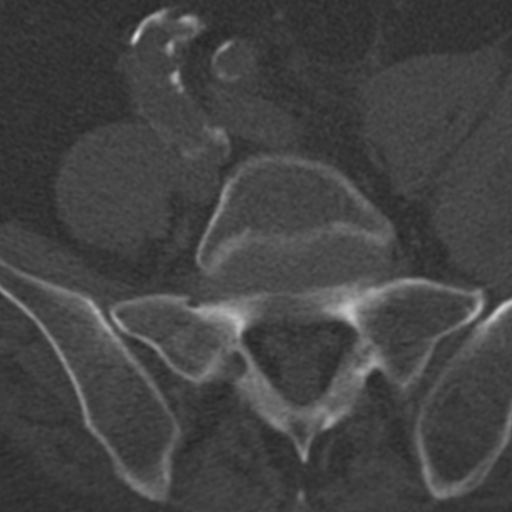
[im 18/72  bone]
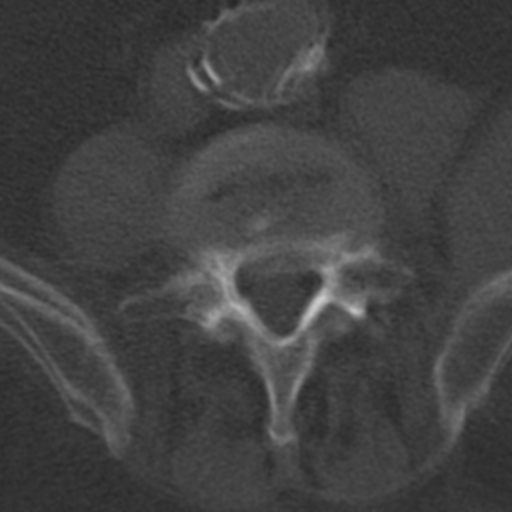
[im 27/72  bone]
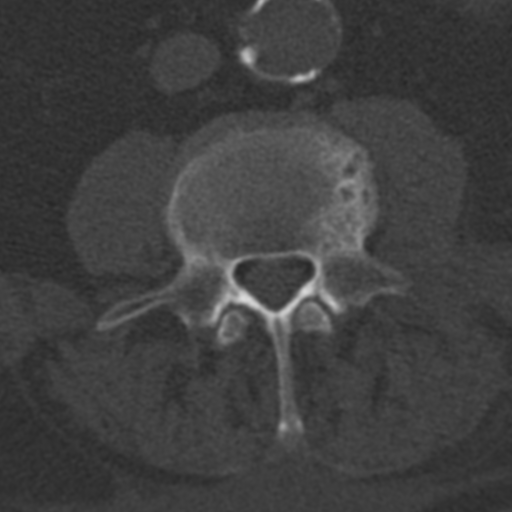
[im 36/72  bone]
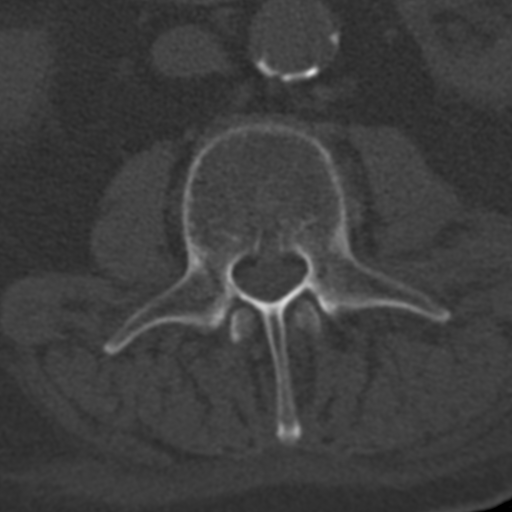
[im 45/72  soft-tissue]
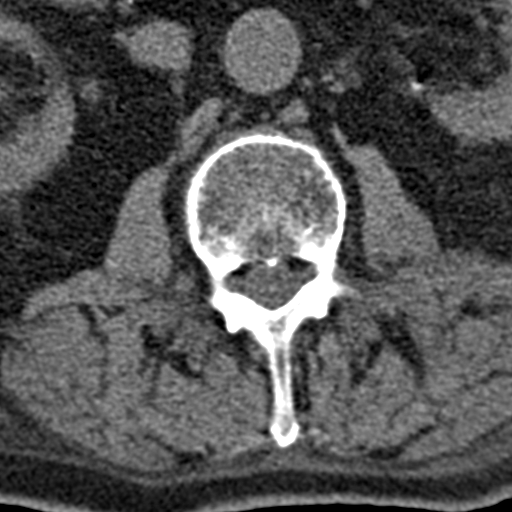
[im 45/72  bone]
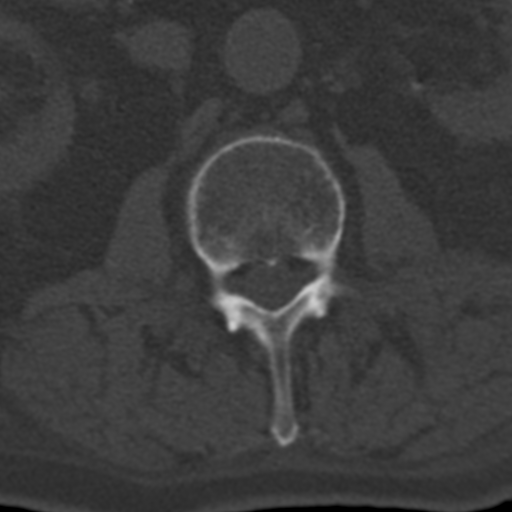
[im 54/72  bone]
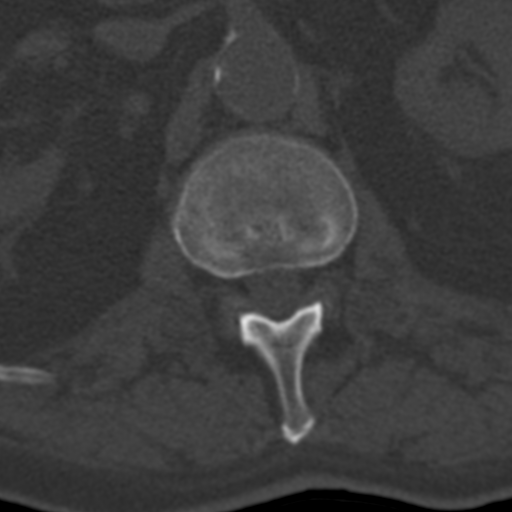
[im 63/72  bone]
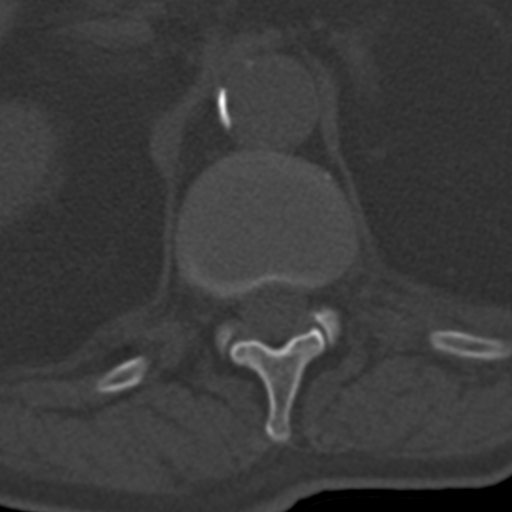

[Series 6: l-spine 3.0 b30s · axial · 0.29mm/px · z∈[+820,+877]mm · 3 of 39 slices shown (2 of 2)]
[im 10/39  bone]
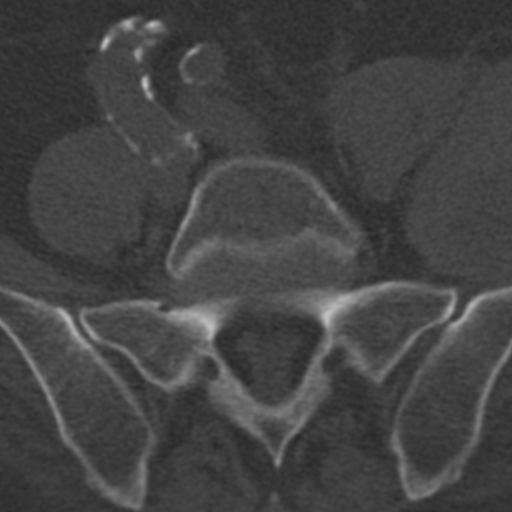
[im 20/39  bone]
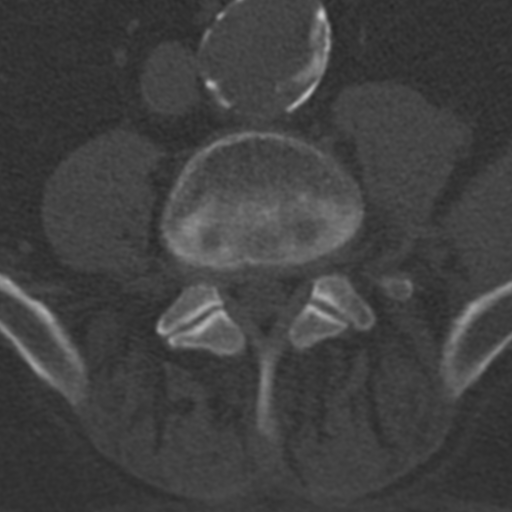
[im 29/39  bone]
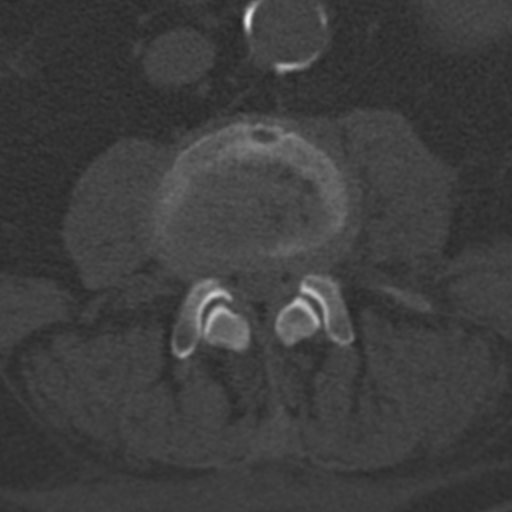

[Series 602: <mpr thick range> · coronal · 0.42mm/px · 1 of 60 slices shown]
[im 30/60  bone]
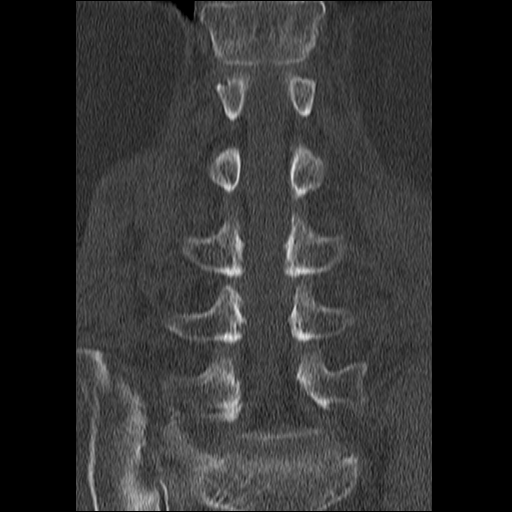

[Series 606: <mpr thick range(4)> · sagittal · 0.42mm/px · 5 of 48 slices shown]
[im 8/48  bone]
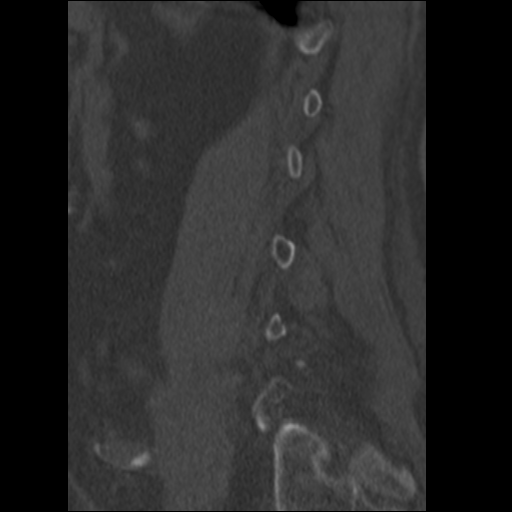
[im 16/48  bone]
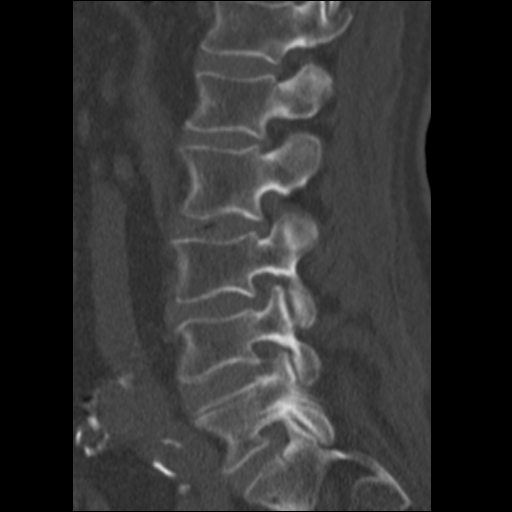
[im 24/48  bone]
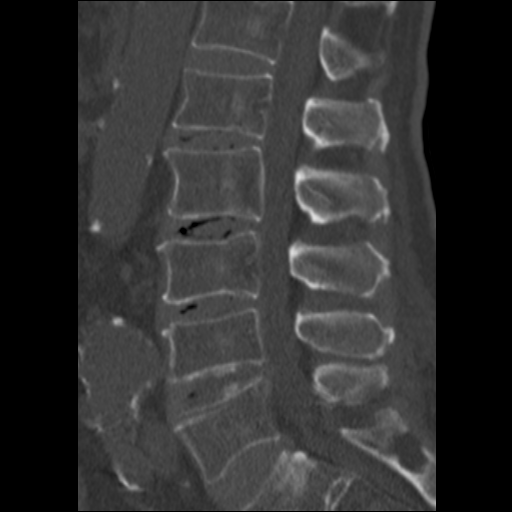
[im 32/48  bone]
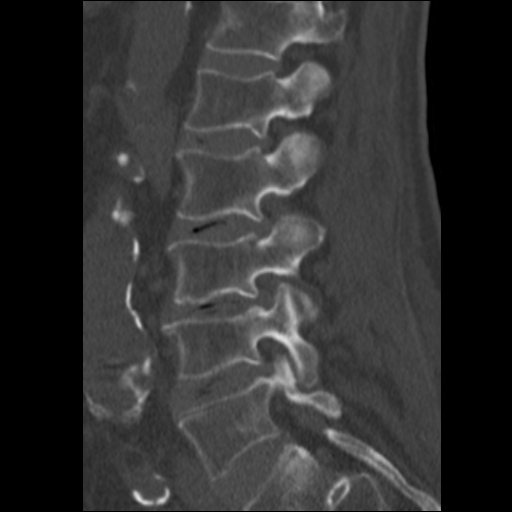
[im 40/48  bone]
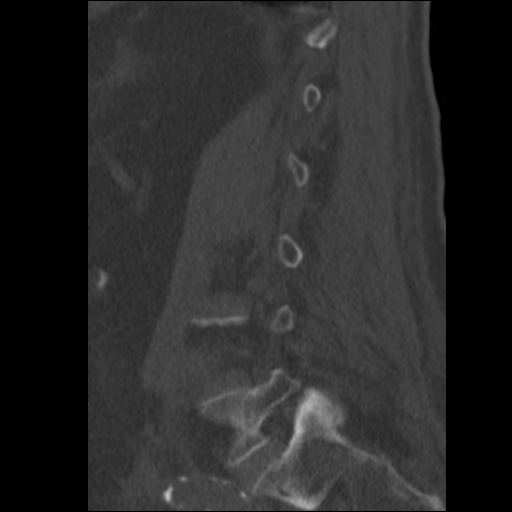

[16 of 33 positions shown; findings below may reference images not displayed]

FINDINGS: There are five lumbar type vertebral bodies.  The
alignment is normal.  There is no evidence of fracture or pars
defect. The lumbar pedicles are somewhat short on a congenital
basis.

There is diffuse aorto iliac atherosclerosis.  The distal abdominal
aorta demonstrates a fusiform aneurysm which is incompletely
visualized, but measures up to approximately 4.0 cm anterior-
posterior.  There is no evidence of retroperitoneal hematoma.

L1-L2:  Mild disc bulging with vacuum phenomenon, facet and
ligamentous hypertrophy.  There is mild resulting central stenosis.
The foramina are sufficiently patent.

L2-L3: There is discal vacuum phenomenon with annular bulging
eccentric to the left.  No definite left L2 nerve root encroachment
is seen.  Facet and ligamentous hypertrophy contributes to mild
central stenosis.

L3-L4:  There is greater annular disc bulging at this level with
associated vacuum phenomenon.  There is moderate facet and
ligamentous hypertrophy.  These factors contribute to moderate
central stenosis.  Mild inferior foraminal narrowing is present,
left greater than right.

L4-L5:  Disc bulging is mildly eccentric to the right.  There is
facet and ligamentous hypertrophy.  There is mild central and mild
right foraminal stenosis.

L5-S1:  Disc bulging is asymmetric to the right and results in mild
right foraminal stenosis.  There is mild facet and ligamentous
hypertrophy.  The central canal and left foramen are patent.
IMPRESSION: 1.  Multilevel spondylosis superimposed on a congenitally small
canal. There is moderate spinal stenosis at L3-L4.  Central
stenosis is mild at the L1-L2, L2-L3 and L4-L5 levels.
2.  Mild foraminal and lateral recess stenosis as detailed above
secondary to asymmetric disc bulging.  No high-grade foraminal
stenosis or nerve root compression identified.
3.  No acute osseous findings.

4.  Distal abdominal aortic aneurysm, incompletely visualized, but
measuring approximately 4 cm AP.

## 2014-01-20 ENCOUNTER — Telehealth: Payer: Self-pay

## 2014-01-20 DIAGNOSIS — L739 Follicular disorder, unspecified: Secondary | ICD-10-CM | POA: Diagnosis not present

## 2014-01-20 DIAGNOSIS — D1801 Hemangioma of skin and subcutaneous tissue: Secondary | ICD-10-CM | POA: Diagnosis not present

## 2014-01-20 DIAGNOSIS — L570 Actinic keratosis: Secondary | ICD-10-CM | POA: Diagnosis not present

## 2014-01-20 DIAGNOSIS — L821 Other seborrheic keratosis: Secondary | ICD-10-CM | POA: Diagnosis not present

## 2014-01-20 DIAGNOSIS — C44319 Basal cell carcinoma of skin of other parts of face: Secondary | ICD-10-CM | POA: Diagnosis not present

## 2014-01-22 ENCOUNTER — Institutional Professional Consult (permissible substitution): Payer: BC Managed Care – PPO | Admitting: Critical Care Medicine

## 2014-01-26 ENCOUNTER — Institutional Professional Consult (permissible substitution): Payer: BC Managed Care – PPO | Admitting: Critical Care Medicine

## 2014-01-27 ENCOUNTER — Ambulatory Visit (INDEPENDENT_AMBULATORY_CARE_PROVIDER_SITE_OTHER): Payer: BC Managed Care – PPO | Admitting: Internal Medicine

## 2014-01-27 ENCOUNTER — Encounter: Payer: Self-pay | Admitting: Internal Medicine

## 2014-01-27 ENCOUNTER — Ambulatory Visit (INDEPENDENT_AMBULATORY_CARE_PROVIDER_SITE_OTHER)
Admission: RE | Admit: 2014-01-27 | Discharge: 2014-01-27 | Disposition: A | Discharge status: Home / Self Care | Payer: BC Managed Care – PPO | Source: Ambulatory Visit | Attending: Internal Medicine | Admitting: Internal Medicine

## 2014-01-27 VITALS — BP 132/78 | HR 90 | Temp 98.1°F | Ht 71.0 in | Wt 207.0 lb

## 2014-01-27 DIAGNOSIS — J9611 Chronic respiratory failure with hypoxia: Secondary | ICD-10-CM

## 2014-01-27 DIAGNOSIS — J4489 Other specified chronic obstructive pulmonary disease: Secondary | ICD-10-CM

## 2014-01-27 DIAGNOSIS — R0902 Hypoxemia: Secondary | ICD-10-CM

## 2014-01-27 DIAGNOSIS — J9610 Chronic respiratory failure, unspecified whether with hypoxia or hypercapnia: Secondary | ICD-10-CM | POA: Diagnosis not present

## 2014-01-27 DIAGNOSIS — J449 Chronic obstructive pulmonary disease, unspecified: Secondary | ICD-10-CM

## 2014-01-27 DIAGNOSIS — J9612 Chronic respiratory failure with hypercapnia: Secondary | ICD-10-CM | POA: Insufficient documentation

## 2014-01-27 MED ORDER — AMOXICILLIN-POT CLAVULANATE 875-125 MG PO TABS: 1.0000 | ORAL_TABLET | Freq: Two times a day (BID) | ORAL | Status: DC

## 2014-01-27 MED ORDER — PREDNISONE 10 MG PO TABS: ORAL_TABLET | ORAL | Status: DC

## 2014-01-27 NOTE — Assessment & Plan Note (Addendum)
DDX of  difficult airways management all start with A and  include Adherence, Ace Inhibitors, Acid Reflux, Active Sinus Disease, Alpha 1 Antitripsin deficiency, Anxiety masquerading as Airways dz,  ABPA,  allergy(esp in young), Aspiration (esp in elderly), Adverse effects of DPI,  Active smokers, plus two Bs  = Bronchiectasis and Beta blocker use..and one C= CHF  Adherence is always the initial "prime suspect" and is a multilayered concern that requires a "trust but verify" approach in every patient - starting with knowing how to use medications, especially inhalers, correctly, keeping up with refills and understanding the fundamental difference between maintenance and prns vs those medications only taken for a very short course and then stopped and not refilled.  The proper method of use, as well as anticipated side effects, of a metered-dose inhaler are discussed and demonstrated to the patient. Improved effectiveness after extensive coaching during this visit to a level of approximately  75% from a level of < 25% so should give symbicort another chance before adding lama, esp since thinks steroids help, and use atrovent qid per neb but use saba prn only  For now   ? Active sinus dz > Augmentin 875 mg take one pill twice daily  X 10 days - take at breakfast and supper with large glass of water.  It would help reduce the usual side effects (diarrhea and yeast infections) if you ate cultured yogurt at lunch.   ? Allergy > Prednisone 10 mg take  4 each am x 2 days,   2 each am x 2 days,  1 each am x 2 days and stop  ? chf / note pitting edema ? Cor pulmonale suggested by last ech 07/18/12 with ok LV  See instructions for specific recommendations which were reviewed directly with the patient who was given a copy with highlighter outlining the key components.

## 2014-01-27 NOTE — Assessment & Plan Note (Signed)
Adequate control on present rx, reviewed > no change in rx needed  = 2lpm 24/7  

## 2014-01-27 NOTE — Progress Notes (Signed)
   Subjective:    Patient ID: Alexander Kirby, male    DOB: 1935/03/06,  MRN: [id number]  HPI  18 yowm quit smoking 1994 admit to [facility] 2014 but improved to point where did not need 02 as of March 2015 but sometimes had to push around 100 ft level got worse April 2015 with sob and cough with production of of green mucus some better p steroid shot referred to pulmonary clinic 01/27/2014 by Dr Lisbeth Ply  01/27/2014 1st [facility] office visit/ [place] [facility]  Chief Complaint  Patient presents with  . Pulmonary Consult    Referred by Dr. Daiva Eves. Pt states dxed with COPD approx 10 yrs ago. Pt c/o increased cough and SOB since April 2014. He gets SOB walking minimal distances.  Cough is prod with moderate green sputum.   usual routine > sleeps sitting  up on several pillows / sob when wakes but not typically prematurely or full of phlegm, then immediately use duoneb / uses symbicort 160 last but poor hfa   No obvious day to day or daytime variabilty or assoc   cp or chest tightness, subjective wheeze overt sinus or hb symptoms. No unusual exp hx or h/o childhood pna/ asthma or knowledge of premature birth.  Sleeping ok on 3 pillows without nocturnal  or early am exacerbation  of respiratory  c/o's or need for noct saba. Also denies any obvious fluctuation of symptoms with weather or environmental changes or other aggravating or alleviating factors except as outlined above   Current Medications, Allergies, Complete Past Medical History, Past Surgical History, Family History, and Social History were reviewed in [employer] record.          Review of Systems  Constitutional: Negative for fever, chills, activity change, appetite change and unexpected weight change.  HENT: Positive for congestion. Negative for dental problem, postnasal drip, rhinorrhea, sneezing, sore throat, trouble swallowing and voice change.   Eyes: Negative for visual disturbance.  Respiratory: Positive for  cough and shortness of breath. Negative for choking.   Cardiovascular: Negative for chest pain and leg swelling.  Gastrointestinal: Negative for nausea, vomiting and abdominal pain.  Genitourinary: Negative for difficulty urinating.  Musculoskeletal: Negative for arthralgias.  Skin: Negative for rash.  Psychiatric/Behavioral: Negative for behavioral problems and confusion.       Objective:   Physical Exam   amb wm nad   Wt Readings from Last 3 Encounters:  01/27/14 207 lb (93.895 kg)  08/04/12 210 lb 8.6 oz (95.5 kg)     HEENT mild turbinate edema.  Oropharynx no thrush or excess pnd or cobblestoning.  No JVD or cervical adenopathy. Mild accessory muscle hypertrophy. Trachea midline, nl thryroid. Chest was hyperinflated by percussion with diminished breath sounds and moderate increased exp time without wheeze. Hoover sign positive at mid inspiration. Regular rate and rhythm without murmur gallop or rub or increase P2 or edema.  Abd: no hsm, nl excursion. Ext warm without cyanosis or clubbing.    CXR  01/27/2014 :  CM / copd     Assessment & Plan:

## 2014-01-27 NOTE — Patient Instructions (Addendum)
Plan A = automatic =symbicort 160 Take 2 puffs first thing in am and then another 2 puffs about 12 hours later.                                    ipatropium after symbicort and a total of 4 x daily    Plan B = backup  As needed up to every 4 hours, ok to use  neb alb at home or maxair   Prednisone 10 mg take  4 each am x 2 days,   2 each am x 2 days,  1 each am x 2 days and stop   Augmentin 875 mg take one pill twice daily  X 10 days - take at breakfast and supper with large glass of water.  It would help reduce the usual side effects (diarrhea and yeast infections) if you ate cultured yogurt at lunch.    Work on inhaler technique:  relax and gently blow all the way out then take a nice smooth deep breath back in, triggering the inhaler at same time you start breathing in.  Hold for up to 5 seconds if you can.  Rinse and gargle with water when done  Please remember to go to the lab and x-ray department downstairs for your tests - we will call you with the results when they are available.  Please schedule a follow up office visit in 4 weeks, sooner if needed with pfts on return  Late add recheck bnp/bmet on return

## 2014-01-28 NOTE — Progress Notes (Signed)
Quick Note:  ATC, NA and no option to leave msg, WCB ______ 

## 2014-03-08 DIAGNOSIS — C44319 Basal cell carcinoma of skin of other parts of face: Secondary | ICD-10-CM | POA: Diagnosis not present

## 2014-03-08 DIAGNOSIS — Z85828 Personal history of other malignant neoplasm of skin: Secondary | ICD-10-CM | POA: Diagnosis not present

## 2014-03-10 ENCOUNTER — Ambulatory Visit: Payer: BC Managed Care – PPO | Admitting: Internal Medicine

## 2014-03-24 ENCOUNTER — Encounter: Payer: Self-pay | Admitting: Internal Medicine

## 2014-03-24 ENCOUNTER — Ambulatory Visit (INDEPENDENT_AMBULATORY_CARE_PROVIDER_SITE_OTHER): Payer: BC Managed Care – PPO | Admitting: Internal Medicine

## 2014-03-24 ENCOUNTER — Other Ambulatory Visit (INDEPENDENT_AMBULATORY_CARE_PROVIDER_SITE_OTHER): Payer: BC Managed Care – PPO

## 2014-03-24 VITALS — BP 132/80 | HR 73 | Temp 98.0°F | Ht 71.0 in | Wt 210.0 lb

## 2014-03-24 DIAGNOSIS — J449 Chronic obstructive pulmonary disease, unspecified: Secondary | ICD-10-CM

## 2014-03-24 DIAGNOSIS — R0600 Dyspnea, unspecified: Secondary | ICD-10-CM

## 2014-03-24 DIAGNOSIS — I10 Essential (primary) hypertension: Secondary | ICD-10-CM | POA: Diagnosis not present

## 2014-03-24 DIAGNOSIS — E039 Hypothyroidism, unspecified: Secondary | ICD-10-CM

## 2014-03-24 DIAGNOSIS — J9611 Chronic respiratory failure with hypoxia: Secondary | ICD-10-CM | POA: Diagnosis not present

## 2014-03-24 LAB — PULMONARY FUNCTION TEST
DL/VA % pred: 52 %
DL/VA: 2.42 ml/min/mmHg/L
DLCO UNC: 8.85 ml/min/mmHg
DLCO unc % pred: 26 %
FEF 25-75 POST: 0.34 L/s
FEF 25-75 Pre: 0.34 L/sec
FEF2575-%Change-Post: 1 %
FEF2575-%PRED-POST: 15 %
FEF2575-%Pred-Pre: 15 %
FEV1-%CHANGE-POST: 5 %
FEV1-%PRED-POST: 28 %
FEV1-%Pred-Pre: 27 %
FEV1-POST: 0.89 L
FEV1-Pre: 0.85 L
FEV1FVC-%CHANGE-POST: 4 %
FEV1FVC-%Pred-Pre: 62 %
FEV6-%Change-Post: 0 %
FEV6-%PRED-PRE: 44 %
FEV6-%Pred-Post: 44 %
FEV6-Post: 1.83 L
FEV6-Pre: 1.81 L
FEV6FVC-%Change-Post: 0 %
FEV6FVC-%PRED-POST: 102 %
FEV6FVC-%PRED-PRE: 103 %
FVC-%CHANGE-POST: 0 %
FVC-%PRED-POST: 43 %
FVC-%PRED-PRE: 43 %
FVC-POST: 1.89 L
FVC-Pre: 1.88 L
PRE FEV1/FVC RATIO: 45 %
PRE FEV6/FVC RATIO: 97 %
Post FEV1/FVC ratio: 47 %
Post FEV6/FVC ratio: 96 %
RV % PRED: 193 %
RV: 5.12 L
TLC % pred: 101 %
TLC: 7.38 L

## 2014-03-24 LAB — CBC WITH DIFFERENTIAL/PLATELET
BASOS PCT: 0.4 % (ref 0.0–3.0)
Basophils Absolute: 0 10*3/uL (ref 0.0–0.1)
EOS ABS: 0.2 10*3/uL (ref 0.0–0.7)
Eosinophils Relative: 2.1 % (ref 0.0–5.0)
HCT: 41.2 % (ref 39.0–52.0)
HEMOGLOBIN: 13.6 g/dL (ref 13.0–17.0)
Lymphocytes Relative: 21.8 % (ref 12.0–46.0)
Lymphs Abs: 2 10*3/uL (ref 0.7–4.0)
MCHC: 33.1 g/dL (ref 30.0–36.0)
MCV: 92.8 fl (ref 78.0–100.0)
MONO ABS: 0.9 10*3/uL (ref 0.1–1.0)
Monocytes Relative: 9.4 % (ref 3.0–12.0)
NEUTROS ABS: 6.1 10*3/uL (ref 1.4–7.7)
Neutrophils Relative %: 66.3 % (ref 43.0–77.0)
Platelets: 227 10*3/uL (ref 150.0–400.0)
RBC: 4.44 Mil/uL (ref 4.22–5.81)
RDW: 13.9 % (ref 11.5–15.5)
WBC: 9.3 10*3/uL (ref 4.0–10.5)

## 2014-03-24 LAB — BRAIN NATRIURETIC PEPTIDE: Pro B Natriuretic peptide (BNP): 25 pg/mL (ref 0.0–100.0)

## 2014-03-24 MED ORDER — PREDNISONE 10 MG PO TABS: ORAL_TABLET | ORAL | Status: DC

## 2014-03-24 MED ORDER — ACLIDINIUM BROMIDE 400 MCG/ACT IN AEPB: 1.0000 | INHALATION_SPRAY | Freq: Two times a day (BID) | RESPIRATORY_TRACT | Status: DC

## 2014-03-24 NOTE — Patient Instructions (Addendum)
Prednisone Take 4 for three days 3 for three days 2 for three days 1 for three days and stop   Symbicort 160 Take 2 puffs first thing in am and then another 2 puffs about 12 hours later.   Caprice Renshaw one twice daily trial and off the ipatropium neb  Only use your albuterol (maxair) as a rescue medication to be used if you can't catch your breath by resting or doing a relaxed purse lip breathing pattern.  - The less you use it, the better it will work when you need it. - Ok to use up to 2 puffs  every 4 hours if you must but call for immediate appointment if use goes up over your usual need - Don't leave home without it !!  (think of it like the spare tire for your car)  - if not better after maxair go ahead and use your nebulizer up to every 4 hours with albuterol only, not ipatropium  Please remember to go to the lab  department downstairs for your tests - we will call you with the results when they are available.  See Tammy NP w/in next  6  weeks with all your medications, even over the counter meds, separated in two separate bags, the ones you take no matter what vs the ones you stop once you feel better and take only as needed when you feel you need them.   Tammy  will generate for you a new user friendly medication calendar that will put [place] all on the same page re: your medication use.     Without this process, it simply isn't possible to assure that we are providing  your outpatient care  with  the attention to detail we feel you deserve.   If we cannot assure that you're getting that kind of care,  then we cannot manage your problem effectively from this clinic.  Once you have seen Tammy and we are sure that we're all on the same page with your medication use she will arrange follow up with me.   Late add tsh 8 > synthroid 25 mcg per day started

## 2014-03-24 NOTE — Progress Notes (Signed)
PFT done today. 

## 2014-03-24 NOTE — Progress Notes (Signed)
Subjective:    Patient ID: Alexander Kirby, male    DOB: 1934-11-12,  MRN: [id number]    Brief patient profile:  53 yowm quit smoking 1994 admit to [facility] 2014 but improved to point where did not need 02 as of March 2015 but sometimes had to push around 100 ft level got worse April 2015 with sob and cough with production of of green mucus some better p steroid shot referred to pulmonary clinic 01/27/2014 by Dr Lisbeth Ply with documented GOLD IV COPD 03/24/14    History of Present Illness  01/27/2014 1st [facility] office visit/ [facility]  Chief Complaint  Patient presents with  . Pulmonary Consult    Referred by Dr. Daiva Eves. Pt states dxed with COPD approx 10 yrs ago. Pt c/o increased cough and SOB since April 2014. He gets SOB walking minimal distances.  Cough is prod with moderate green sputum.   usual routine > sleeps sitting  up on several pillows / sob when wakes but not typically prematurely or full of phlegm, then immediately use duoneb / uses symbicort 160 last but poor hfa  rec Plan A = automatic =symbicort 160 Take 2 puffs first thing in am and then another 2 puffs about 12 hours later.                                    ipatropium after symbicort and a total of 4 x daily  Plan B = backup  As needed up to every 4 hours, ok to use  neb alb at home or maxair  Prednisone 10 mg take  4 each am x 2 days,   2 each am x 2 days,  1 each am x 2 days and stop  Augmentin 875 mg take one pill twice daily  X 10 days  Work on inhaler technique:    add recheck bnp/bmet on return  . 03/24/2014 f/u ov/Wert re: GOLD IV COPD  Chief Complaint  Patient presents with  . Follow-up    PFT done today. Pt states that his breathing has improved some. He states cough is unchanged. He states that he feels better in general.      Marked reduction in saba/ neb dependency  No obvious day to day or daytime variabilty or assoc  cp or chest tightness, subjective wheeze overt sinus or hb symptoms. No unusual  exp hx or h/o childhood pna/ asthma or knowledge of premature birth.  Sleeping ok without nocturnal  or early am exacerbation  of respiratory  c/o's or need for noct saba. Also denies any obvious fluctuation of symptoms with weather or environmental changes or other aggravating or alleviating factors except as outlined above   Current Medications, Allergies, Complete Past Medical History, Past Surgical History, Family History, and Social History were reviewed in [employer] record.  ROS  The following are not active complaints unless bolded sore throat, dysphagia, dental problems, itching, sneezing,  nasal congestion or excess/ purulent secretions, ear ache,   fever, chills, sweats, unintended wt loss, pleuritic or exertional cp, hemoptysis,  orthopnea pnd or leg swelling, presyncope, palpitations, heartburn, abdominal pain, anorexia, nausea, vomiting, diarrhea  or change in bowel or urinary habits, change in stools or urine, dysuria,hematuria,  rash, arthralgias, visual complaints, headache, numbness weakness or ataxia or problems with walking or coordination,  change in mood/affect or memory.  Objective:   Physical Exam   W/c bound wm nad on 02   03/24/2014       210  Wt Readings from Last 3 Encounters:  01/27/14 207 lb (93.895 kg)  08/04/12 210 lb 8.6 oz (95.5 kg)     HEENT mild turbinate edema.  Oropharynx no thrush or excess pnd or cobblestoning.  No JVD or cervical adenopathy. Mild accessory muscle hypertrophy. Trachea midline, nl thryroid. Chest was hyperinflated by percussion with diminished breath sounds and moderate increased exp time without wheeze. Hoover sign positive at mid inspiration. Regular rate and rhythm without murmur gallop or rub or increase P2 or edema.  Abd: no hsm, nl excursion. Ext warm without cyanosis or clubbing.    CXR  01/27/2014 :  CM / copd    Recent Labs Lab 03/24/14 1700  NA 138  K 3.6  CL 98  CO2  33*  BUN 19  CREATININE 1.4  GLUCOSE 183*    Recent Labs Lab 03/24/14 1700  HGB 13.6  HCT 41.2  WBC 9.3  PLT 227.0     Lab Results  Component Value Date   TSH 8.48* 03/24/2014     Lab Results  Component Value Date   PROBNP 25.0 03/24/2014       Assessment & Plan:

## 2014-03-24 NOTE — Assessment & Plan Note (Addendum)
-   03/24/2014   Walked 2 lpm nl pace but stopped x 2  And completed  one lap @ 185 stopped due to   Sob with adequate sats - 03/24/14 HCO3 = 33 c/w hypercarbia   rx as of 03/24/2014 = 2lpm with walking and sleeping, 1lpm at rest sitting

## 2014-03-25 DIAGNOSIS — E039 Hypothyroidism, unspecified: Secondary | ICD-10-CM | POA: Insufficient documentation

## 2014-03-25 LAB — BASIC METABOLIC PANEL
BUN: 19 mg/dL (ref 6–23)
CO2: 33 meq/L — AB (ref 19–32)
Calcium: 9 mg/dL (ref 8.4–10.5)
Chloride: 98 mEq/L (ref 96–112)
Creatinine, Ser: 1.4 mg/dL (ref 0.4–1.5)
GFR: 50.98 mL/min — ABNORMAL LOW (ref >60.00)
GLUCOSE: 183 mg/dL — AB (ref 70–99)
POTASSIUM: 3.6 meq/L (ref 3.5–5.1)
SODIUM: 138 meq/L (ref 135–145)

## 2014-03-25 LAB — TSH: TSH: 8.48 u[IU]/mL — ABNORMAL HIGH (ref 0.35–4.50)

## 2014-03-25 NOTE — Assessment & Plan Note (Addendum)
-   PFTs 03/24/2014 FEV1  0.85 (27%) ratio 45 and dlco 26 corrects to 52%  Based on severity he needs LAMA not sama > rec trial of tudorza one bid  The proper method of use, as well as anticipated side effects, of a metered-dose dry powder inhaler are discussed and demonstrated to the patient. Improved effectiveness after extensive coaching during this visit to a level of approximately  90%      Each maintenance medication was reviewed in detail including most importantly the difference between maintenance and as needed and under what circumstances the prns are to be used.  Please see instructions for details which were reviewed in writing and the patient given a copy.

## 2014-03-25 NOTE — Assessment & Plan Note (Signed)
No evidence chf or likely other mech besides copd given severity of dz

## 2014-03-25 NOTE — Assessment & Plan Note (Signed)
Lab Results  Component Value Date   TSH 8.48* 03/24/2014     rx synthroid 25 mcg per day and recheck TSH in 4-6 weeks

## 2014-03-26 ENCOUNTER — Telehealth: Payer: Self-pay | Admitting: Internal Medicine

## 2014-03-26 MED ORDER — PREDNISONE 10 MG PO TABS: ORAL_TABLET | ORAL | Status: DC

## 2014-03-26 NOTE — Telephone Encounter (Signed)
Called and spoke with Alexander Kirby---she stated that the AVS that was given to them at the last OV did not match the rx that was sent in for the prednisone. She stated that the pt has not started on the prednisone yet since they wanted to clarify this rx.  MW please advise. thanks  Prednisone Take 4 for three days 3 for three days 2 for three days 1 for three days and stop--this was on the AVS Prednisone take 4 tabs x 3 days, 2 tablets for 2 days, 1 tablet x 2 days then stop.  This is the RX that was sent in.

## 2014-03-26 NOTE — Telephone Encounter (Signed)
lmomtcb x1 

## 2014-03-26 NOTE — Telephone Encounter (Signed)
Spoke with pt's wife Jana Half, states that they only received 14 tabs of prednisone.  I verified that MW has stated that the dosage on pt's AVS was correct: 4tabs daily X3 days, 3 tabs daily X3 days, 2 tabs daily X3 days, 1 tab daily X3 days.  Verified pharmacy, called in supplemental prednisone to pharmacy.  Nothing further needed at this time.

## 2014-03-26 NOTE — Telephone Encounter (Signed)
The avs is correct and correlates exactly with the number of pills prescribed - sorry for the confusion

## 2014-03-30 NOTE — Progress Notes (Signed)
Quick Note:  ATC, NA and no option to leave a msg, WCB ______ 

## 2014-04-06 ENCOUNTER — Telehealth: Payer: Self-pay | Admitting: Internal Medicine

## 2014-04-06 ENCOUNTER — Other Ambulatory Visit: Payer: Self-pay | Admitting: Internal Medicine

## 2014-04-06 MED ORDER — LEVOTHYROXINE SODIUM 25 MCG PO TABS: 25.0000 ug | ORAL_TABLET | Freq: Every day | ORAL | Status: DC

## 2014-04-06 NOTE — Progress Notes (Signed)
Quick Note:  Spoke with pt and notified of results per Dr. Melvyn Novas. Pt verbalized understanding. He did not know which pharmacy he wants me to send med to and asked me to call his spouse, Jana Half to ask her- [phone]- called and had to [facility] ______

## 2014-04-06 NOTE — Progress Notes (Signed)
Quick Note:  Spoke with pt's spouse, Jana Half and notified of results and recs- rx was sent to pharm and pt will keep ov with TP in Nov   ______

## 2014-04-06 NOTE — Telephone Encounter (Signed)
See lab result note.

## 2014-04-22 DIAGNOSIS — N401 Enlarged prostate with lower urinary tract symptoms: Secondary | ICD-10-CM | POA: Diagnosis not present

## 2014-04-22 DIAGNOSIS — R338 Other retention of urine: Secondary | ICD-10-CM | POA: Diagnosis not present

## 2014-04-28 DIAGNOSIS — E780 Pure hypercholesterolemia: Secondary | ICD-10-CM | POA: Diagnosis not present

## 2014-04-28 DIAGNOSIS — E1165 Type 2 diabetes mellitus with hyperglycemia: Secondary | ICD-10-CM | POA: Diagnosis not present

## 2014-04-30 DIAGNOSIS — I10 Essential (primary) hypertension: Secondary | ICD-10-CM | POA: Diagnosis not present

## 2014-04-30 DIAGNOSIS — J449 Chronic obstructive pulmonary disease, unspecified: Secondary | ICD-10-CM | POA: Diagnosis not present

## 2014-04-30 DIAGNOSIS — N183 Chronic kidney disease, stage 3 (moderate): Secondary | ICD-10-CM | POA: Diagnosis not present

## 2014-04-30 DIAGNOSIS — E1165 Type 2 diabetes mellitus with hyperglycemia: Secondary | ICD-10-CM | POA: Diagnosis not present

## 2014-04-30 DIAGNOSIS — E780 Pure hypercholesterolemia: Secondary | ICD-10-CM | POA: Diagnosis not present

## 2014-05-04 ENCOUNTER — Encounter: Payer: Self-pay | Admitting: Adult Health

## 2014-05-04 ENCOUNTER — Ambulatory Visit (INDEPENDENT_AMBULATORY_CARE_PROVIDER_SITE_OTHER): Payer: BC Managed Care – PPO | Admitting: Adult Health

## 2014-05-04 VITALS — BP 126/74 | HR 78 | Temp 97.0°F | Ht 71.0 in | Wt 212.0 lb

## 2014-05-04 DIAGNOSIS — J449 Chronic obstructive pulmonary disease, unspecified: Secondary | ICD-10-CM

## 2014-05-04 MED ORDER — ACLIDINIUM BROMIDE 400 MCG/ACT IN AEPB: 1.0000 | INHALATION_SPRAY | Freq: Two times a day (BID) | RESPIRATORY_TRACT | Status: DC

## 2014-05-04 MED ORDER — ACLIDINIUM BROMIDE 400 MCG/ACT IN AEPB
1.0000 | INHALATION_SPRAY | Freq: Two times a day (BID) | RESPIRATORY_TRACT | Status: DC
Start: 2014-05-04 — End: 2014-11-16

## 2014-05-04 NOTE — Progress Notes (Signed)
Subjective:    Patient ID: Alexander Kirby, male    DOB: January 25, 1935,  MRN: [id number]    Brief patient profile:  5 yowm quit smoking 1994 admit to [facility] 2014 but improved to point where did not need 02 as of March 2015 but sometimes had to push around 100 ft level got worse April 2015 with sob and cough with production of of green mucus some better p steroid shot referred to pulmonary clinic 01/27/2014 by Dr Lisbeth Ply with documented GOLD IV COPD 03/24/14    History of Present Illness  01/27/2014 1st [facility] office visit/ [facility]  Chief Complaint  Patient presents with  . Pulmonary Consult    Referred by Dr. Daiva Eves. Pt states dxed with COPD approx 10 yrs ago. Pt c/o increased cough and SOB since April 2014. He gets SOB walking minimal distances.  Cough is prod with moderate green sputum.   usual routine > sleeps sitting  up on several pillows / sob when wakes but not typically prematurely or full of phlegm, then immediately use duoneb / uses symbicort 160 last but poor hfa  rec Plan A = automatic =symbicort 160 Take 2 puffs first thing in am and then another 2 puffs about 12 hours later.                                    ipatropium after symbicort and a total of 4 x daily  Plan B = backup  As needed up to every 4 hours, ok to use  neb alb at home or maxair  Prednisone 10 mg take  4 each am x 2 days,   2 each am x 2 days,  1 each am x 2 days and stop  Augmentin 875 mg take one pill twice daily  X 10 days  Work on inhaler technique:    add recheck bnp/bmet on return  . 03/24/2014 f/u ov/Wert re: GOLD IV COPD  Chief Complaint  Patient presents with  . Follow-up    PFT done today. Pt states that his breathing has improved some. He states cough is unchanged. He states that he feels better in general.      Marked reduction in saba/ neb dependency >pred taper   05/04/2014 Follow up GOLD IV COPD  Patient presents for one-month follow-up for COPD We reviewed all his medications  organize them into a medication count with patient education It appears that he is taking his medications correctly. He does feel that his breathing has improved and is near his baseline He denies any flare of cough or wheezing Patient denies any chest pain, orthopnea, PND, or increased leg swelling  Current Medications, Allergies, Complete Past Medical History, Past Surgical History, Family History, and Social History were reviewed in [employer] record.  ROS  The following are not active complaints unless bolded sore throat, dysphagia, dental problems, itching, sneezing,  nasal congestion or excess/ purulent secretions, ear ache,   fever, chills, sweats, unintended wt loss, pleuritic or exertional cp, hemoptysis,  orthopnea pnd or leg swelling, presyncope, palpitations, heartburn, abdominal pain, anorexia, nausea, vomiting, diarrhea  or change in bowel or urinary habits, change in stools or urine, dysuria,hematuria,  rash, arthralgias, visual complaints, headache, numbness weakness or ataxia or problems with walking or coordination,  change in mood/affect or memory.  Objective:   Physical Exam   W/c bound wm nad on 02   03/24/2014       210     HEENT mild turbinate edema.  Oropharynx no thrush or excess pnd or cobblestoning.  No JVD or cervical adenopathy. Mild accessory muscle hypertrophy. Trachea midline, nl thryroid. Chest was hyperinflated by percussion with diminished breath sounds and moderate increased exp time without wheeze. Hoover sign positive at mid inspiration. Regular rate and rhythm without murmur gallop or rub , +tr to 1 + edema   Abd: no hsm, nl excursion. Ext warm without cyanosis or clubbing.    CXR  01/27/2014 :  CM / copd    Recent Labs Lab 03/24/14 1700  NA 138  K 3.6  CL 98  CO2 33*  BUN 19  CREATININE 1.4  GLUCOSE 183*    Recent Labs Lab 03/24/14 1700  HGB 13.6  HCT 41.2  WBC 9.3  PLT 227.0       Lab Results  Component Value Date   TSH 8.48* 03/24/2014     Lab Results  Component Value Date   PROBNP 25.0 03/24/2014       Assessment & Plan:

## 2014-05-04 NOTE — Addendum Note (Signed)
Addended by: Parke Poisson E on: 05/04/2014 05:37 PM   Modules accepted: Orders

## 2014-05-04 NOTE — Assessment & Plan Note (Addendum)
Recent flare now resolved  Patient's medications were reviewed today and patient education was given. Computerized medication calendar was adjusted/completed   Plan  Cont on current regimen  follow up Dr. Melvyn Novas  In 4 months

## 2014-05-04 NOTE — Patient Instructions (Signed)
Follow med calendar closely and bring to each visit.  Follow up Dr. Wert  In 3-4 months  and As needed   

## 2014-05-05 ENCOUNTER — Encounter: Payer: BC Managed Care – PPO | Admitting: Internal Medicine

## 2014-05-07 NOTE — Addendum Note (Signed)
Addended by: Parke Poisson E on: 05/07/2014 05:34 PM   Modules accepted: Orders, Medications

## 2014-05-28 DIAGNOSIS — E039 Hypothyroidism, unspecified: Secondary | ICD-10-CM | POA: Diagnosis not present

## 2014-06-07 DIAGNOSIS — Z961 Presence of intraocular lens: Secondary | ICD-10-CM | POA: Diagnosis not present

## 2014-06-07 DIAGNOSIS — E119 Type 2 diabetes mellitus without complications: Secondary | ICD-10-CM | POA: Diagnosis not present

## 2014-06-07 DIAGNOSIS — Z794 Long term (current) use of insulin: Secondary | ICD-10-CM | POA: Diagnosis not present

## 2014-08-04 DIAGNOSIS — E039 Hypothyroidism, unspecified: Secondary | ICD-10-CM | POA: Diagnosis not present

## 2014-08-11 ENCOUNTER — Encounter: Payer: Self-pay | Admitting: Internal Medicine

## 2014-08-11 ENCOUNTER — Ambulatory Visit (INDEPENDENT_AMBULATORY_CARE_PROVIDER_SITE_OTHER): Payer: BLUE CROSS/BLUE SHIELD | Admitting: Internal Medicine

## 2014-08-11 VITALS — BP 148/80 | HR 77 | Ht 71.0 in | Wt 213.0 lb

## 2014-08-11 DIAGNOSIS — J449 Chronic obstructive pulmonary disease, unspecified: Secondary | ICD-10-CM | POA: Diagnosis not present

## 2014-08-11 DIAGNOSIS — J9612 Chronic respiratory failure with hypercapnia: Secondary | ICD-10-CM

## 2014-08-11 NOTE — Patient Instructions (Signed)
Work on inhaler technique:  relax and gently blow all the way out then take a nice smooth deep breath back in, triggering the inhaler at same time you start breathing in.  Hold for up to 5 seconds if you can.  Rinse and gargle with water when done  See calendar for specific medication instructions and bring it back for each and every office visit for every healthcare provider you see.  Without it,  you may not receive the best quality medical care that we feel you deserve.  You will note that the calendar groups together  your maintenance  medications that are timed at particular times of the day.  Think of this as your checklist for what your doctor has instructed you to do until your next evaluation to see what benefit  there is  to staying on a consistent group of medications intended to keep you well.  The other group at the bottom is entirely up to you to use as you see fit  for specific symptoms that may arise between visits that require you to treat them on an as needed basis.  Think of this as your action plan or "what if" list.   Separating the top medications from the bottom group is fundamental to providing you adequate care going forward.

## 2014-08-11 NOTE — Progress Notes (Signed)
Subjective:    Patient ID: Alexander Kirby, male    DOB: 15-Jun-1935,  MRN: [id number]    Brief patient profile:  41 yowm quit smoking 1994 admit to [facility] [place] 2014 but improved to point where did not need 02 as of March 2015 but persistent doe x 100 ft on  Level grade got worse April 2015 with sob and cough with production of of green mucus some better p steroid shot referred to pulmonary clinic 01/27/2014 by Dr Lisbeth Ply with documented GOLD IV COPD 03/24/14    History of Present Illness  01/27/2014 1st [place] [facility] office visit/ Wert  Chief Complaint  Patient presents with  . Pulmonary Consult    Referred by Dr. Daiva Eves. Pt states dxed with COPD approx 10 yrs ago. Pt c/o increased cough and SOB since April 2014. He gets SOB walking minimal distances.  Cough is prod with moderate green sputum.   usual routine > sleeps sitting  up on several pillows / sob when wakes but not typically prematurely or full of phlegm, then immediately use duoneb / uses symbicort 160 last but poor hfa  rec Plan A = automatic =symbicort 160 Take 2 puffs first thing in am and then another 2 puffs about 12 hours later.                                    ipatropium after symbicort and a total of 4 x daily  Plan B = backup  As needed up to every 4 hours, ok to use  neb alb at home or maxair  Prednisone 10 mg take  4 each am x 2 days,   2 each am x 2 days,  1 each am x 2 days and stop  Augmentin 875 mg take one pill twice daily  X 10 days  Work on inhaler technique:      . 03/24/2014 f/u ov/Wert re: GOLD IV COPD  Chief Complaint  Patient presents with  . Follow-up    PFT done today. Pt states that his breathing has improved some. He states cough is unchanged. He states that he feels better in general.      Marked reduction in saba/ neb dependency >pred taper   05/04/2014 Follow up GOLD IV COPD  Patient presents for one-month follow-up for COPD We reviewed all his medications organize them into a medication  count with patient education It appears that he is taking his medications correctly. He does feel that his breathing has improved and is near his baseline rec Use med calendar    08/11/2014 f/u ov/Wert re:  GOLD IV copd/ 02 dep Chief Complaint  Patient presents with  . Follow-up    Pt states that his breathing is doing well. No new co's today.  He has used neb only 2 x since last visit and rarely usues maxair.   walks [place] ft [place] [employer] [place] on 02 on 2lpm some piddling around the yard / always uses w/c to get into office   No obvious day to day or daytime variabilty or assoc chronic cough or cp or chest tightness, subjective wheeze overt sinus or hb symptoms. No unusual exp hx or h/o childhood pna/ asthma or knowledge of premature birth.  Sleeping ok without nocturnal  or early am exacerbation  of respiratory  c/o's or need for noct saba. Also denies any obvious fluctuation of symptoms with weather or  environmental changes or other aggravating or alleviating factors except as outlined above   Current Medications, Allergies, Complete Past Medical History, Past Surgical History, Family History, and Social History were reviewed in [employer] record.  ROS  The following are not active complaints unless bolded sore throat, dysphagia, dental problems, itching, sneezing,  nasal congestion or excess/ purulent secretions, ear ache,   fever, chills, sweats, unintended wt loss, pleuritic or exertional cp, hemoptysis,  orthopnea pnd or leg swelling, presyncope, palpitations, heartburn, abdominal pain, anorexia, nausea, vomiting, diarrhea  or change in bowel or urinary habits, change in stools or urine, dysuria,hematuria,  rash, arthralgias, visual complaints, headache, numbness weakness or ataxia or problems with walking or coordination,  change in mood/affect or memory.                    Objective:   Physical Exam   W/c bound wm nad on 02   03/24/2014       210 >  08/11/2014       HEENT mild turbinate edema.  Oropharynx no thrush or excess pnd or cobblestoning.  No JVD or cervical adenopathy. Mild accessory muscle hypertrophy. Trachea midline, nl thryroid. Chest was hyperinflated by percussion with diminished breath sounds and moderate increased exp time without wheeze. Hoover sign positive at mid inspiration. Regular rate and rhythm without murmur gallop or rub , +tr to 1 + edema both lower ext  Abd: no hsm, nl excursion. Ext warm without cyanosis or clubbing.                    Assessment & Plan:

## 2014-08-11 NOTE — Assessment & Plan Note (Addendum)
-   03/24/2014   Walked 2 lpm nl pace but stopped x 2  And completed  one lap @ 185 stopped due to   Sob with adequate sats - 03/24/14 HCO3 = 33 c/w hypercarbia  Adequate control on present rx, reviewed > no change in rx needed   rx as of 08/11/2014 = 2lpm with walking and sleeping, 1lpm at rest sitting

## 2014-08-11 NOTE — Assessment & Plan Note (Addendum)
-   quit smoking 1990s  - PFTs 03/24/2014 FEV1  0.85 (27%) ratio 45 and dlco 26 corrects to 52%  - 03/24/14 trial of LAMA and d/c SAMA = tudorza 1 bid  - 08/11/2014 declined rehab    The proper method of use, as well as anticipated side effects, of a metered-dose inhaler are discussed and demonstrated to the patient. Improved effectiveness after extensive coaching during this visit to a level of approximately  75% from hfa and 90% with dpi   I had an extended discussion with the patient reviewing all relevant studies completed to date and  lasting 15 to 20 minutes of a 25 minute visit on the following ongoing concerns:   Despite severity of dz well compensated on present complex regimen/ might consider BREO next ov depending on whether still not mastering hfa  Each maintenance medication was reviewed in detail including most importantly the difference between maintenance and as needed and under what circumstances the prns are to be used. This was done in the context of a medication calendar review which provided the patient with a user-friendly unambiguous mechanism for medication administration and reconciliation and provides an action plan for all active problems. It is critical that this be shown to every doctor  for modification during the office visit if necessary so the patient can use it as a working document.

## 2014-09-01 DIAGNOSIS — E039 Hypothyroidism, unspecified: Secondary | ICD-10-CM | POA: Diagnosis not present

## 2014-09-01 DIAGNOSIS — E780 Pure hypercholesterolemia: Secondary | ICD-10-CM | POA: Diagnosis not present

## 2014-09-01 DIAGNOSIS — E1165 Type 2 diabetes mellitus with hyperglycemia: Secondary | ICD-10-CM | POA: Diagnosis not present

## 2014-09-03 DIAGNOSIS — Z6833 Body mass index (BMI) 33.0-33.9, adult: Secondary | ICD-10-CM | POA: Diagnosis not present

## 2014-09-03 DIAGNOSIS — N183 Chronic kidney disease, stage 3 (moderate): Secondary | ICD-10-CM | POA: Diagnosis not present

## 2014-09-03 DIAGNOSIS — E780 Pure hypercholesterolemia: Secondary | ICD-10-CM | POA: Diagnosis not present

## 2014-09-03 DIAGNOSIS — I10 Essential (primary) hypertension: Secondary | ICD-10-CM | POA: Diagnosis not present

## 2014-09-03 DIAGNOSIS — E039 Hypothyroidism, unspecified: Secondary | ICD-10-CM | POA: Diagnosis not present

## 2014-09-03 DIAGNOSIS — E1165 Type 2 diabetes mellitus with hyperglycemia: Secondary | ICD-10-CM | POA: Diagnosis not present

## 2014-09-03 DIAGNOSIS — J449 Chronic obstructive pulmonary disease, unspecified: Secondary | ICD-10-CM | POA: Diagnosis not present

## 2014-10-27 DIAGNOSIS — D1801 Hemangioma of skin and subcutaneous tissue: Secondary | ICD-10-CM | POA: Diagnosis not present

## 2014-10-27 DIAGNOSIS — D1722 Benign lipomatous neoplasm of skin and subcutaneous tissue of left arm: Secondary | ICD-10-CM | POA: Diagnosis not present

## 2014-10-27 DIAGNOSIS — D1721 Benign lipomatous neoplasm of skin and subcutaneous tissue of right arm: Secondary | ICD-10-CM | POA: Diagnosis not present

## 2014-10-27 DIAGNOSIS — Z85828 Personal history of other malignant neoplasm of skin: Secondary | ICD-10-CM | POA: Diagnosis not present

## 2014-10-27 DIAGNOSIS — L821 Other seborrheic keratosis: Secondary | ICD-10-CM | POA: Diagnosis not present

## 2014-10-27 DIAGNOSIS — D225 Melanocytic nevi of trunk: Secondary | ICD-10-CM | POA: Diagnosis not present

## 2014-10-27 DIAGNOSIS — C4361 Malignant melanoma of right upper limb, including shoulder: Secondary | ICD-10-CM | POA: Diagnosis not present

## 2014-11-16 ENCOUNTER — Encounter: Payer: Self-pay | Admitting: Internal Medicine

## 2014-11-16 ENCOUNTER — Ambulatory Visit (INDEPENDENT_AMBULATORY_CARE_PROVIDER_SITE_OTHER): Payer: BLUE CROSS/BLUE SHIELD | Admitting: Internal Medicine

## 2014-11-16 VITALS — BP 142/66 | HR 89 | Ht 71.0 in | Wt 214.0 lb

## 2014-11-16 DIAGNOSIS — J449 Chronic obstructive pulmonary disease, unspecified: Secondary | ICD-10-CM

## 2014-11-16 DIAGNOSIS — J9612 Chronic respiratory failure with hypercapnia: Secondary | ICD-10-CM

## 2014-11-16 NOTE — Patient Instructions (Addendum)
Work on inhaler technique:  relax and gently blow all the way out then take a nice smooth deep breath back in, triggering the inhaler at same time you start breathing in.  Hold for up to 5 seconds if you can.  Rinse and gargle with water when done   See calendar for specific medication instructions and bring it back for each and every office visit for every healthcare provider you see.  Without it,  you may not receive the best quality medical care that we feel you deserve.  You will note that the calendar groups together  your maintenance  medications that are timed at particular times of the day.  Think of this as your checklist for what your doctor has instructed you to do until your next evaluation to see what benefit  there is  to staying on a consistent group of medications intended to keep you well.  The other group at the bottom is entirely up to you to use as you see fit  for specific symptoms that may arise between visits that require you to treat them on an as needed basis.  Think of this as your action plan or "what if" list.   Separating the top medications from the bottom group is fundamental to providing you adequate care going forward.    Please schedule a follow up visit in 4  months but call sooner if needed

## 2014-11-16 NOTE — Progress Notes (Addendum)
Subjective:    Patient ID: Alexander Kirby, male    DOB: 04/12/1935,  MRN: [id number]    Brief patient profile:  36 yowm quit smoking 1994 admit to [facility] 2014 but improved to point where did not need 02 as of March 2015 but persistent doe x 100 ft on  Level grade got worse April 2015 with sob and cough with production of of green mucus some better p steroid shot referred to pulmonary clinic 01/27/2014 by Dr Lisbeth Ply with documented GOLD IV COPD 03/24/14    History of Present Illness  01/27/2014 1st [place] [facility] office visit/ Alexander Kirby  Chief Complaint  Patient presents with  . Pulmonary Consult    Referred by Dr. Daiva Eves. Pt states dxed with COPD approx 10 yrs ago. Pt c/o increased cough and SOB since April 2014. He gets SOB walking minimal distances.  Cough is prod with moderate green sputum.   usual routine > sleeps sitting  up on several pillows / sob when wakes but not typically prematurely or full of phlegm, then immediately use duoneb / uses symbicort 160 last but poor hfa  rec Plan A = automatic =symbicort 160 Take 2 puffs first thing in am and then another 2 puffs about 12 hours later.                                    ipatropium after symbicort and a total of 4 x daily  Plan B = backup  As needed up to every 4 hours, ok to use  neb alb at home or maxair  Prednisone 10 mg take  4 each am x 2 days,   2 each am x 2 days,  1 each am x 2 days and stop  Augmentin 875 mg take one pill twice daily  X 10 days  Work on inhaler technique:      . 03/24/2014 f/u ov/Alexander Kirby re: GOLD IV COPD  Chief Complaint  Patient presents with  . Follow-up    PFT done today. Pt states that his breathing has improved some. He states cough is unchanged. He states that he feels better in general.      Marked reduction in saba/ neb dependency >pred taper   05/04/2014 Follow up GOLD IV COPD  Patient presents for one-month follow-up for COPD We reviewed all his medications organize them into a medication  count with patient education It appears that he is taking his medications correctly. He does feel that his breathing has improved and is near his baseline rec Use med calendar    08/11/2014 f/u ov/Alexander Kirby re:  GOLD IV copd/ 02 dep Chief Complaint  Patient presents with  . Follow-up    Pt states that his breathing is doing well. No new co's today.  He has used neb only 2 x since last visit and rarely usues maxair.   walks [place] ft [place] [employer] store on 02 on 2lpm some piddling around the yard / always uses w/c to get into office rec Work on inhaler technique:   See calendar for specific medication instructions    11/16/2014 f/u ov/Alexander Kirby re: copd / no med calendar Chief Complaint  Patient presents with  . Follow-up    Pt states his breathing is unchanged. No new co's today.   using maxair rarely / not aware of action plan at bottom of med calendar  Still sob with more than slow  adls/ only walks 50 ft or so at a time = MMRC 2/3 on 2lpm    No obvious day to day or daytime variabilty or assoc chronic cough or cp or chest tightness, subjective wheeze overt sinus or hb symptoms. No unusual exp hx or h/o childhood pna/ asthma or knowledge of premature birth.  Sleeping ok without nocturnal  or early am exacerbation  of respiratory  c/o's or need for noct saba. Also denies any obvious fluctuation of symptoms with weather or environmental changes or other aggravating or alleviating factors except as outlined above   Current Medications, Allergies, Complete Past Medical History, Past Surgical History, Family History, and Social History were reviewed in [employer] record.  ROS  The following are not active complaints unless bolded sore throat, dysphagia, dental problems, itching, sneezing,  nasal congestion or excess/ purulent secretions, ear ache,   fever, chills, sweats, unintended wt loss, pleuritic or exertional cp, hemoptysis,  orthopnea pnd or leg swelling, presyncope,  palpitations, heartburn, abdominal pain, anorexia, nausea, vomiting, diarrhea  or change in bowel or urinary habits, change in stools or urine, dysuria,hematuria,  rash, arthralgias, visual complaints, headache, numbness weakness or ataxia or problems with walking or coordination,  change in mood/affect or memory.                    Objective:   Physical Exam   amb wm nad walking in on [place] for the first time (always w/c before)   03/24/2014       210 > 08/11/2014  > 11/16/2014    HEENT mild turbinate edema.  Oropharynx no thrush or excess pnd or cobblestoning.  No JVD or cervical adenopathy. Mild accessory muscle hypertrophy. Trachea midline, nl thryroid. Chest was hyperinflated by percussion with diminished breath sounds and moderate increased exp time without wheeze. Hoover sign positive at mid inspiration. Regular rate and rhythm without murmur gallop or rub ,   1 + edema both lower ext  Abd: no hsm, nl excursion. Ext warm without cyanosis or clubbing.                    Assessment & Plan:   Outpatient Encounter Prescriptions as of 11/16/2014  Medication Sig  . Aclidinium Bromide (TUDORZA PRESSAIR) 400 MCG/ACT AEPB Inhale 1 puff into the lungs 2 (two) times daily.  Marland Kitchen albuterol (PROVENTIL) (2.5 MG/3ML) 0.083% nebulizer solution Take 2.5 mg by nebulization every 4 (four) hours as needed. With atrovent  . budesonide-formoterol (SYMBICORT) 160-4.5 MCG/ACT inhaler Inhale 2 puffs into the lungs 2 (two) times daily.  . colestipol (COLESTID) 1 G tablet Take 1 g by mouth daily.  . finasteride (PROSCAR) 5 MG tablet Take 5 mg by mouth daily.  . furosemide (LASIX) 80 MG tablet Take 80 mg by mouth daily.  Marland Kitchen guaiFENesin (MUCINEX) 600 MG 12 hr tablet Take 2 tablets (1,200 mg total) by mouth 2 (two) times daily. (Patient taking differently: Take 600 mg by mouth 2 (two) times daily as needed for cough or to loosen phlegm. )  . insulin glargine (LANTUS) 100 UNIT/ML injection Inject 15 Units into the  skin at bedtime. (Patient taking differently: Inject 25 Units into the skin 2 (two) times daily. )  . levothyroxine (SYNTHROID, LEVOTHROID) 75 MCG tablet Take 75 mcg by mouth daily before breakfast.  . loratadine (CLARITIN) 10 MG tablet Take 10 mg by mouth daily as needed.   Marland Kitchen NIFEdipine (PROCARDIA XL/ADALAT-CC) 60 MG 24 hr tablet Take 60 mg  by mouth every morning.  . pirbuterol (MAXAIR) 200 MCG/INH inhaler Inhale 2 puffs into the lungs 4 (four) times daily as needed. FOR SHORTNESS OF BREATH OR WHEEZING  . Potassium Chloride 40 MEQ/15ML (20%) SOLN Take 15 mLs by mouth daily.  . Probiotic Product (ALIGN) 4 MG CAPS Take 1 capsule by mouth daily.  . Red Yeast Rice Extract (RED YEAST RICE PO) Take 1 tablet by mouth 3 (three) times daily.   . rivaroxaban (XARELTO) 20 MG TABS tablet Take 20 mg by mouth daily with supper.  Marland Kitchen SYNTHROID 50 MCG tablet Take 75 mcg by mouth daily.  . Tamsulosin HCl (FLOMAX) 0.4 MG CAPS Take 0.4 mg by mouth at bedtime.   . [DISCONTINUED] Aclidinium Bromide (TUDORZA PRESSAIR) 400 MCG/ACT AEPB Inhale 1 puff into the lungs 2 (two) times daily.   No facility-administered encounter medications on file as of 11/16/2014.

## 2014-11-17 ENCOUNTER — Encounter: Payer: Self-pay | Admitting: Internal Medicine

## 2014-11-17 NOTE — Assessment & Plan Note (Signed)
-   quit smoking 1990s  - PFTs 03/24/2014 FEV1  0.85 (27%) ratio 45 and dlco 26 corrects to 52%  - 03/24/14 trial of LAMA and d/c SAMA = tudorza 1 bid  - 08/11/2014  100% with dpi  - 08/11/2014 declined rehab   The proper method of use, as well as anticipated side effects, of a metered-dose inhaler are discussed and demonstrated to the patient. Improved effectiveness after extensive coaching during this visit to a level of approximately  75%   Despite poor hfa he's actually doing pretty well considering severity of copd (very severe = gold IV)  I had an extended discussion with the patient reviewing all relevant studies completed to date and  lasting 15 to 20 minutes of a 25 minute visit on the following ongoing concerns:  1) not using med calendar as requested  2) does not understand action plan even when I reprinted the instructions which I had him read back to me so he sees every problem he has is addressed reading Right to Left across the bottom of the calendar with max doses listed  3) Each maintenance medication was reviewed in detail including most importantly the difference between maintenance and as needed and under what circumstances the prns are to be used.  Please see instructions for details which were reviewed in writing and the patient given a copy.

## 2014-11-17 NOTE — Assessment & Plan Note (Signed)
-   03/24/2014   Walked 2 lpm nl pace but stopped x 2  And completed  one lap @ 185 stopped due to   Sob with adequate sats - 03/24/14 HCO3 = 33 c/w hypercarbia  rx = 2lpm with walking and sleeping, 1lpm at rest sitting   asssured me he tracks sats on his own and they are fine on above rx

## 2014-12-03 ENCOUNTER — Telehealth: Payer: Self-pay | Admitting: Internal Medicine

## 2014-12-03 NOTE — Telephone Encounter (Signed)
Spoke with pt's wife and informed her what she could do to with the additional medication. Nothing further needed at this time.

## 2015-03-06 ENCOUNTER — Other Ambulatory Visit: Payer: Self-pay | Admitting: Adult Health

## 2015-03-07 NOTE — Telephone Encounter (Signed)
Pt has appt on 03/21/15 with MW

## 2015-03-21 ENCOUNTER — Encounter: Payer: Self-pay | Admitting: Internal Medicine

## 2015-03-21 ENCOUNTER — Ambulatory Visit (INDEPENDENT_AMBULATORY_CARE_PROVIDER_SITE_OTHER): Payer: BLUE CROSS/BLUE SHIELD | Admitting: Internal Medicine

## 2015-03-21 VITALS — BP 150/62 | HR 75 | Ht 71.0 in | Wt 214.0 lb

## 2015-03-21 DIAGNOSIS — J9612 Chronic respiratory failure with hypercapnia: Secondary | ICD-10-CM

## 2015-03-21 DIAGNOSIS — J449 Chronic obstructive pulmonary disease, unspecified: Secondary | ICD-10-CM

## 2015-03-21 DIAGNOSIS — Z23 Encounter for immunization: Secondary | ICD-10-CM | POA: Diagnosis not present

## 2015-03-21 NOTE — Patient Instructions (Signed)
Work on inhaler technique:  relax and gently blow all the way out then take a nice smooth deep breath back in, triggering the inhaler at same time you start breathing in.  Hold for up to 5 seconds if you can. Blow out thru nose. Rinse and gargle with water when done  See calendar for specific medication instructions and bring it back for each and every office visit for every healthcare provider you see.  Without it,  you may not receive the best quality medical care that we feel you deserve.  You will note that the calendar groups together  your maintenance  medications that are timed at particular times of the day.  Think of this as your checklist for what your doctor has instructed you to do until your next evaluation to see what benefit  there is  to staying on a consistent group of medications intended to keep you well.  The other group at the bottom is entirely up to you to use as you see fit  for specific symptoms that may arise between visits that require you to treat them on an as needed basis.  Think of this as your action plan or "what if" list.   Separating the top medications from the bottom group is fundamental to providing you adequate care going forward.

## 2015-03-21 NOTE — Progress Notes (Signed)
Subjective:    Patient ID: Alexander Kirby, male    DOB: 1934/06/29,  MRN: [id number]    Brief patient profile:  33 yowm quit smoking 1994 admit to [facility] [place] 2014 but improved to point where did not need 02 as of March 2015 but persistent doe x 100 ft on  Level grade got worse April 2015 with sob and cough with production of of green mucus some better p steroid shot referred to pulmonary clinic 01/27/2014 by Dr Lisbeth Ply with documented GOLD IV COPD 03/24/14    History of Present Illness  01/27/2014 1st [place] [place] [facility] office visit/ Alexander Kirby  Chief Complaint  Patient presents with  . Pulmonary Consult    Referred by Dr. Daiva Eves. Pt states dxed with COPD approx 10 yrs ago. Pt c/o increased cough and SOB since April 2014. He gets SOB walking minimal distances.  Cough is prod with moderate green sputum.   usual routine > sleeps sitting  up on several pillows / sob when wakes but not typically prematurely or full of phlegm, then immediately use duoneb / uses symbicort 160 last but poor hfa  rec Plan A = automatic =symbicort 160 Take 2 puffs first thing in am and then another 2 puffs about 12 hours later.                                    ipatropium after symbicort and a total of 4 x daily  Plan B = backup  As needed up to every 4 hours, ok to use  neb alb at home or maxair  Prednisone 10 mg take  4 each am x 2 days,   2 each am x 2 days,  1 each am x 2 days and stop  Augmentin 875 mg take one pill twice daily  X 10 days  Work on inhaler technique:       08/11/2014 f/u ov/Alexander Kirby re:  GOLD IV copd/ 02 dep Chief Complaint  Patient presents with  . Follow-up    Pt states that his breathing is doing well. No new co's today.  He has used neb only 2 x since last visit and rarely usues maxair.   walks [place] ft [place] [employer] store on 02 on 2lpm some piddling around the yard / always uses w/c to get into office rec Work on inhaler technique:   See calendar for specific medication  instructions    11/16/2014 f/u ov/Alexander Kirby re: copd / no med calendar Chief Complaint  Patient presents with  . Follow-up    Pt states his breathing is unchanged. No new co's today.   using maxair rarely / not aware of action plan at bottom of med calendar  Still sob with more than slow adls/ only walks 50 ft or so at a time = MMRC 2/3 on 2lpm  rec Work on inhaler technique:    See calendar for specific medication instructions Separating the top medications from the bottom group is fundamental to providing you adequate care going forward.       03/21/2015  f/u ov/Alexander Kirby re: copd GOLD IV/ 02 dep   no change doe = can do a [employer] leaning on cart on 02 but MMRC3 = can't walk 100 yards even at a slow pace at a flat grade s stopping due to sob   Did not bring in med calendar  When having a bad day can do better  p maxair rx but rarely uses it  02 24/7 2 lpm     No obvious day to day or daytime variabilty or assoc chronic cough or cp or chest tightness, subjective wheeze overt sinus or hb symptoms. No unusual exp hx or h/o childhood pna/ asthma or knowledge of premature birth.  Sleeping ok without nocturnal  or early am exacerbation  of respiratory  c/o's or need for noct saba. Also denies any obvious fluctuation of symptoms with weather or environmental changes or other aggravating or alleviating factors except as outlined above   Current Medications, Allergies, Complete Past Medical History, Past Surgical History, Family History, and Social History were reviewed in [employer] record.  ROS  The following are not active complaints unless bolded sore throat, dysphagia, dental problems, itching, sneezing,  nasal congestion or excess/ purulent secretions, ear ache,   fever, chills, sweats, unintended wt loss, pleuritic or exertional cp, hemoptysis,  orthopnea pnd or leg swelling, presyncope, palpitations, heartburn, abdominal pain, anorexia, nausea, vomiting, diarrhea  or  change in bowel or urinary habits, change in stools or urine, dysuria,hematuria,  rash, arthralgias, visual complaints, headache, numbness weakness or ataxia or problems with walking or coordination,  change in mood/affect or memory.                    Objective:   Physical Exam   amb wm nad walking   on [place]    03/24/2014       210  >  03/21/2015  214  HEENT mild turbinate edema.  Oropharynx no thrush or excess pnd or cobblestoning.  No JVD or cervical adenopathy. Mild accessory muscle hypertrophy. Trachea midline, nl thryroid. Chest was hyperinflated by percussion with diminished breath sounds and moderate increased exp time without wheeze. Hoover sign positive at mid inspiration. Regular rate and rhythm without murmur gallop or rub ,   1 + edema both lower ext  Abd: no hsm, nl excursion. Ext warm without cyanosis or clubbing.                       Assessment & Plan:

## 2015-03-23 NOTE — Assessment & Plan Note (Signed)
-   03/24/2014   Walked 2 lpm nl pace but stopped x 2  And completed  one lap @ 185 stopped due to   Sob with adequate sats - 03/24/14 HCO3 = 33 c/w hypercarbia  rx as of 03/21/2015  = 2lpm with walking and sleeping, 1lpm at rest sitting

## 2015-03-23 NOTE — Assessment & Plan Note (Addendum)
-   quit smoking 1990s  - PFTs 03/24/2014 FEV1  0.85 (27%) ratio 45 and dlco 26 corrects to 52%  - 03/24/14 trial of LAMA and d/c SAMA = tudorza 1 bid  - 08/11/2014  100% with dpi  - 08/11/2014 declined rehab   The proper method of use, as well as anticipated side effects, of a metered-dose inhaler are discussed and demonstrated to the patient. Improved effectiveness after extensive coaching during this visit to a level of approximately  75% from a baseline of 50% with hfa and 90% with dpi/tudorza     Despite severity of disease and suboptimal hfa technique he is actually well compensated on his present complicated regimen which consists of Symbicort and tudorza  I had an extended discussion with the patient reviewing all relevant studies completed to date and  lasting 15 to 20 minutes of a 25 minute visit    Each maintenance medication was reviewed in detail including most importantly the difference between maintenance and prns and under what circumstances the prns are to be triggered using an action plan format that is not reflected in the computer generated alphabetically organized AVS.    Please see instructions for details which were reviewed in writing and the patient given a copy highlighting the part that I personally wrote and discussed at today's ov.

## 2015-05-19 DIAGNOSIS — E1165 Type 2 diabetes mellitus with hyperglycemia: Secondary | ICD-10-CM | POA: Diagnosis not present

## 2015-05-19 DIAGNOSIS — E039 Hypothyroidism, unspecified: Secondary | ICD-10-CM | POA: Diagnosis not present

## 2015-05-19 DIAGNOSIS — Z79899 Other long term (current) drug therapy: Secondary | ICD-10-CM | POA: Diagnosis not present

## 2015-05-19 DIAGNOSIS — E7800 Pure hypercholesterolemia, unspecified: Secondary | ICD-10-CM | POA: Diagnosis not present

## 2015-05-23 DIAGNOSIS — E119 Type 2 diabetes mellitus without complications: Secondary | ICD-10-CM | POA: Diagnosis not present

## 2015-05-23 DIAGNOSIS — Z961 Presence of intraocular lens: Secondary | ICD-10-CM | POA: Diagnosis not present

## 2015-05-24 DIAGNOSIS — Z1389 Encounter for screening for other disorder: Secondary | ICD-10-CM | POA: Diagnosis not present

## 2015-05-24 DIAGNOSIS — J449 Chronic obstructive pulmonary disease, unspecified: Secondary | ICD-10-CM | POA: Diagnosis not present

## 2015-05-24 DIAGNOSIS — E7800 Pure hypercholesterolemia, unspecified: Secondary | ICD-10-CM | POA: Diagnosis not present

## 2015-05-24 DIAGNOSIS — E1165 Type 2 diabetes mellitus with hyperglycemia: Secondary | ICD-10-CM | POA: Diagnosis not present

## 2015-05-24 DIAGNOSIS — N183 Chronic kidney disease, stage 3 (moderate): Secondary | ICD-10-CM | POA: Diagnosis not present

## 2015-05-24 DIAGNOSIS — I10 Essential (primary) hypertension: Secondary | ICD-10-CM | POA: Diagnosis not present

## 2015-05-24 DIAGNOSIS — Z86711 Personal history of pulmonary embolism: Secondary | ICD-10-CM | POA: Diagnosis not present

## 2015-05-24 DIAGNOSIS — Z9181 History of falling: Secondary | ICD-10-CM | POA: Diagnosis not present

## 2015-06-01 DIAGNOSIS — Z85820 Personal history of malignant melanoma of skin: Secondary | ICD-10-CM | POA: Diagnosis not present

## 2015-06-01 DIAGNOSIS — D225 Melanocytic nevi of trunk: Secondary | ICD-10-CM | POA: Diagnosis not present

## 2015-06-01 DIAGNOSIS — D485 Neoplasm of uncertain behavior of skin: Secondary | ICD-10-CM | POA: Diagnosis not present

## 2015-06-01 DIAGNOSIS — L821 Other seborrheic keratosis: Secondary | ICD-10-CM | POA: Diagnosis not present

## 2015-06-01 DIAGNOSIS — Z85828 Personal history of other malignant neoplasm of skin: Secondary | ICD-10-CM | POA: Diagnosis not present

## 2015-06-01 DIAGNOSIS — D1801 Hemangioma of skin and subcutaneous tissue: Secondary | ICD-10-CM | POA: Diagnosis not present

## 2015-06-01 DIAGNOSIS — L720 Epidermal cyst: Secondary | ICD-10-CM | POA: Diagnosis not present

## 2015-07-29 DIAGNOSIS — N401 Enlarged prostate with lower urinary tract symptoms: Secondary | ICD-10-CM | POA: Diagnosis not present

## 2015-07-29 DIAGNOSIS — R310 Gross hematuria: Secondary | ICD-10-CM | POA: Diagnosis not present

## 2015-07-29 DIAGNOSIS — N138 Other obstructive and reflux uropathy: Secondary | ICD-10-CM | POA: Diagnosis not present

## 2015-08-11 DIAGNOSIS — R310 Gross hematuria: Secondary | ICD-10-CM | POA: Diagnosis not present

## 2015-08-11 DIAGNOSIS — N200 Calculus of kidney: Secondary | ICD-10-CM | POA: Diagnosis not present

## 2015-08-31 DIAGNOSIS — R911 Solitary pulmonary nodule: Secondary | ICD-10-CM | POA: Diagnosis not present

## 2015-08-31 DIAGNOSIS — N138 Other obstructive and reflux uropathy: Secondary | ICD-10-CM | POA: Diagnosis not present

## 2015-08-31 DIAGNOSIS — N401 Enlarged prostate with lower urinary tract symptoms: Secondary | ICD-10-CM | POA: Diagnosis not present

## 2015-08-31 DIAGNOSIS — R3912 Poor urinary stream: Secondary | ICD-10-CM | POA: Diagnosis not present

## 2015-08-31 DIAGNOSIS — R310 Gross hematuria: Secondary | ICD-10-CM | POA: Diagnosis not present

## 2015-09-05 ENCOUNTER — Telehealth: Payer: Self-pay | Admitting: Internal Medicine

## 2015-09-05 NOTE — Telephone Encounter (Signed)
Pt wife calling back again [phone]

## 2015-09-05 NOTE — Telephone Encounter (Signed)
Called and spoke to pt's wife, Jana Half. Jana Half questioning if MW has spoken to Dr. Risa Grill, Urologist, about pt's recent MRI. Advised pt's wife that per pt's chart, there is no note about MW speaking with Dr. Risa Grill. Jana Half is aware that MW is out of office this week. Jana Half states Dr. Risa Grill saw a lung nodule and is requesting an appt for pt to be seen. Spoke with Magda Paganini and she states there was a scan report but might have already been sent to scan. Advised pt's wife to request another report and the disc to bring to appt. Appt made with TP on 09/09/15. Pt's verbalized understanding and denied any further questions or concerns at this time.

## 2015-09-09 ENCOUNTER — Ambulatory Visit (INDEPENDENT_AMBULATORY_CARE_PROVIDER_SITE_OTHER): Payer: BLUE CROSS/BLUE SHIELD | Admitting: Adult Health

## 2015-09-09 ENCOUNTER — Encounter: Payer: Self-pay | Admitting: Adult Health

## 2015-09-09 VITALS — BP 142/70 | HR 82 | Temp 98.2°F | Ht 71.0 in | Wt 208.0 lb

## 2015-09-09 DIAGNOSIS — R911 Solitary pulmonary nodule: Secondary | ICD-10-CM | POA: Diagnosis not present

## 2015-09-09 DIAGNOSIS — J9612 Chronic respiratory failure with hypercapnia: Secondary | ICD-10-CM

## 2015-09-09 DIAGNOSIS — J449 Chronic obstructive pulmonary disease, unspecified: Secondary | ICD-10-CM

## 2015-09-09 MED ORDER — ALPRAZOLAM 0.25 MG PO TABS: ORAL_TABLET | ORAL | Status: DC

## 2015-09-09 NOTE — Progress Notes (Signed)
Subjective:    Patient ID: Alexander Kirby, male    DOB: 05-09-1935,  MRN: [id number]    Brief patient profile:  84 yowm quit smoking 1994 admit to [facility] 2014 but improved to point where did not need 02 as of March 2015 but persistent doe x 100 ft on  Level grade got worse April 2015 with sob and cough with production of of green mucus some better p steroid shot referred to pulmonary clinic 01/27/2014 by Dr Lisbeth Ply with documented GOLD IV COPD 03/24/14    History of Present Illness  01/27/2014 1st [facility] office visit/ Wert  Chief Complaint  Patient presents with  . Pulmonary Consult    Referred by Dr. Daiva Eves. Pt states dxed with COPD approx 10 yrs ago. Pt c/o increased cough and SOB since April 2014. He gets SOB walking minimal distances.  Cough is prod with moderate green sputum.   usual routine > sleeps sitting  up on several pillows / sob when wakes but not typically prematurely or full of phlegm, then immediately use duoneb / uses symbicort 160 last but poor hfa  rec Plan A = automatic =symbicort 160 Take 2 puffs first thing in am and then another 2 puffs about 12 hours later.                                    ipatropium after symbicort and a total of 4 x daily  Plan B = backup  As needed up to every 4 hours, ok to use  neb alb at home or maxair  Prednisone 10 mg take  4 each am x 2 days,   2 each am x 2 days,  1 each am x 2 days and stop  Augmentin 875 mg take one pill twice daily  X 10 days  Work on inhaler technique:       08/11/2014 f/u ov/Wert re:  GOLD IV copd/ 02 dep Chief Complaint  Patient presents with  . Follow-up    Pt states that his breathing is doing well. No new co's today.  He has used neb only 2 x since last visit and rarely usues maxair.   walks 50 ft into country store on 02 on 2lpm some piddling around the yard / always uses w/c to get into office rec Work on inhaler technique:   See calendar for specific medication  instructions    11/16/2014 f/u ov/Wert re: copd / no med calendar Chief Complaint  Patient presents with  . Follow-up    Pt states his breathing is unchanged. No new co's today.   using maxair rarely / not aware of action plan at bottom of med calendar  Still sob with more than slow adls/ only walks 50 ft or so at a time = MMRC 2/3 on 2lpm  rec Work on inhaler technique:    See calendar for specific medication instructions Separating the top medications from the bottom group is fundamental to providing you adequate care going forward.       03/21/2015  f/u ov/Wert re: copd GOLD IV/ 02 dep   no change doe = can do a [employer] leaning on cart on 02 but MMRC3 = can't walk 100 yards even at a slow pace at a flat grade s stopping due to sob   Did not bring in med calendar  When having a bad day can do better  p maxair rx but rarely uses it  02 24/7 2 lpm    >>  09/09/2015 Follow up : COPD  Pt presents for abnormal CT chest  Recently seen by urology for hematuria found to have lung nodule on CT.  He was set up for a CT chest on 08/31/15 that showed 1.9 cm mass on the left abutting the visceral pleura and complete RML atx , high grade stenosis of proximal right mainstem bronchus w/ notable endobronchial mass. And a Exophytic 2.1cm mass distal body of stomach. No mediastinal or hilar adnenopathy noted.  He was referred to our office to discuss results. Went over results with pt and wife.  No increased dyspnea, hemoptysis , chest pain , wt loss.  2015 PFT showed FEV1 27% , O2 dependent .  We discussed proceeding with PET scan .   Current Medications, Allergies, Complete Past Medical History, Past Surgical History, Family History, and Social History were reviewed in [employer] record.  ROS  The following are not active complaints unless bolded sore throat, dysphagia, dental problems, itching, sneezing,  nasal congestion or excess/ purulent secretions, ear ache,   fever,  chills, sweats, unintended wt loss, pleuritic or exertional cp, hemoptysis,  orthopnea pnd or leg swelling, presyncope, palpitations, heartburn, abdominal pain, anorexia, nausea, vomiting, diarrhea  or change in bowel or urinary habits, change in stools or urine, dysuria,hematuria,  rash, arthralgias, visual complaints, headache, numbness weakness or ataxia or problems with walking or coordination,  change in mood/affect or memory.                    Objective:   Physical Exam   amb wm nad walking   on Cane   Filed Vitals:   09/09/15 1358  BP: 142/70  Pulse: 82  Temp: 98.2 F (36.8 C)  TempSrc: Oral  Height: 5\' 11"  (1.803 m)  Weight: 208 lb (94.348 kg)  SpO2: 97%     03/24/2014       210  >  03/21/2015  214  HEENT mild turbinate edema.  Oropharynx no thrush or excess pnd or cobblestoning.  No JVD or cervical adenopathy. Mild accessory muscle hypertrophy. Trachea midline, nl thryroid. Chest was hyperinflated by percussion with diminished breath sounds and moderate increased exp time without wheeze. Hoover sign positive at mid inspiration. Regular rate and rhythm without murmur gallop or rub ,   Tr  + edema both lower ext  Abd: no hsm, nl excursion. Ext warm without cyanosis or clubbing.     Tammy Parrett NP-C  [facility]   09/09/2015                 Assessment & Plan:

## 2015-09-09 NOTE — Addendum Note (Signed)
Addended by: Mathis Dad on: 09/09/2015 03:04 PM   Modules accepted: Orders

## 2015-09-09 NOTE — Patient Instructions (Addendum)
Continue Symbicort and Tudorza.  Set up for PET scan for lung nodule.  Follow up in 2 weeks and As needed   Please contact office for sooner follow up if symptoms do not improve or worsen or seek emergency care

## 2015-09-09 NOTE — Progress Notes (Signed)
Chart and office note reviewed in detail  > agree with a/p as outlined    

## 2015-09-09 NOTE — Assessment & Plan Note (Signed)
Compensated without flare   Plan  Continue Symbicort and Tudorza.  Follow up in 2 weeks and As needed   Please contact office for sooner follow up if symptoms do not improve or worsen or seek emergency care

## 2015-09-09 NOTE — Assessment & Plan Note (Signed)
Suspicious lung nodule  Set up for PET scan , once done can evauate area in stomach more , most likely need GI referral as well .  If lung nodule is positive without evidence of distant dz , can consider CT guided biopsy as pt has very low reserve doubt he would be surgical candidate.  Will discuss case with Dr. Melvyn Novas

## 2015-09-09 NOTE — Assessment & Plan Note (Signed)
Cont on O2 .  

## 2015-09-20 ENCOUNTER — Encounter (HOSPITAL_COMMUNITY)
Admission: RE | Admit: 2015-09-20 | Discharge: 2015-09-20 | Disposition: A | Discharge status: Home / Self Care | Payer: BLUE CROSS/BLUE SHIELD | Source: Ambulatory Visit | Attending: Adult Health | Admitting: Adult Health

## 2015-09-20 DIAGNOSIS — R911 Solitary pulmonary nodule: Secondary | ICD-10-CM | POA: Diagnosis not present

## 2015-09-20 LAB — GLUCOSE, CAPILLARY: GLUCOSE-CAPILLARY: 123 mg/dL — AB (ref 65–99)

## 2015-09-20 MED ORDER — FLUDEOXYGLUCOSE F - 18 (FDG) INJECTION
10.2000 | Freq: Once | INTRAVENOUS | Status: AC | PRN
  Administered 2015-09-20: 10.2 via INTRAVENOUS

## 2015-09-21 ENCOUNTER — Ambulatory Visit (HOSPITAL_COMMUNITY): Payer: BLUE CROSS/BLUE SHIELD

## 2015-09-23 DIAGNOSIS — E7800 Pure hypercholesterolemia, unspecified: Secondary | ICD-10-CM | POA: Diagnosis not present

## 2015-09-23 DIAGNOSIS — E039 Hypothyroidism, unspecified: Secondary | ICD-10-CM | POA: Diagnosis not present

## 2015-09-23 DIAGNOSIS — E1165 Type 2 diabetes mellitus with hyperglycemia: Secondary | ICD-10-CM | POA: Diagnosis not present

## 2015-09-26 DIAGNOSIS — E876 Hypokalemia: Secondary | ICD-10-CM | POA: Diagnosis not present

## 2015-09-26 DIAGNOSIS — E1165 Type 2 diabetes mellitus with hyperglycemia: Secondary | ICD-10-CM | POA: Diagnosis not present

## 2015-09-26 DIAGNOSIS — E039 Hypothyroidism, unspecified: Secondary | ICD-10-CM | POA: Diagnosis not present

## 2015-09-26 DIAGNOSIS — K319 Disease of stomach and duodenum, unspecified: Secondary | ICD-10-CM | POA: Diagnosis not present

## 2015-09-26 DIAGNOSIS — N183 Chronic kidney disease, stage 3 (moderate): Secondary | ICD-10-CM | POA: Diagnosis not present

## 2015-09-26 DIAGNOSIS — D649 Anemia, unspecified: Secondary | ICD-10-CM | POA: Diagnosis not present

## 2015-09-26 DIAGNOSIS — J449 Chronic obstructive pulmonary disease, unspecified: Secondary | ICD-10-CM | POA: Diagnosis not present

## 2015-09-26 DIAGNOSIS — I10 Essential (primary) hypertension: Secondary | ICD-10-CM | POA: Diagnosis not present

## 2015-09-26 DIAGNOSIS — Z6832 Body mass index (BMI) 32.0-32.9, adult: Secondary | ICD-10-CM | POA: Diagnosis not present

## 2015-09-26 DIAGNOSIS — E7800 Pure hypercholesterolemia, unspecified: Secondary | ICD-10-CM | POA: Diagnosis not present

## 2015-09-26 DIAGNOSIS — Z23 Encounter for immunization: Secondary | ICD-10-CM | POA: Diagnosis not present

## 2015-09-26 DIAGNOSIS — R918 Other nonspecific abnormal finding of lung field: Secondary | ICD-10-CM | POA: Diagnosis not present

## 2015-09-28 ENCOUNTER — Encounter: Payer: Self-pay | Admitting: Internal Medicine

## 2015-09-28 ENCOUNTER — Ambulatory Visit: Payer: BLUE CROSS/BLUE SHIELD | Admitting: Adult Health

## 2015-09-28 ENCOUNTER — Ambulatory Visit (INDEPENDENT_AMBULATORY_CARE_PROVIDER_SITE_OTHER): Payer: BLUE CROSS/BLUE SHIELD | Admitting: Internal Medicine

## 2015-09-28 VITALS — BP 132/84 | HR 91 | Ht 68.0 in | Wt 200.0 lb

## 2015-09-28 DIAGNOSIS — J9612 Chronic respiratory failure with hypercapnia: Secondary | ICD-10-CM

## 2015-09-28 DIAGNOSIS — J449 Chronic obstructive pulmonary disease, unspecified: Secondary | ICD-10-CM

## 2015-09-28 DIAGNOSIS — R911 Solitary pulmonary nodule: Secondary | ICD-10-CM

## 2015-09-28 NOTE — Progress Notes (Signed)
Subjective:    Patient ID: Alexander Kirby, male    DOB: 05-09-1935,  MRN: [id number]    Brief patient profile:  84 yowm quit smoking 1994 admit to [facility] 2014 but improved to point where did not need 02 as of March 2015 but persistent doe x 100 ft on  Level grade got worse April 2015 with sob and cough with production of of green mucus some better p steroid shot referred to pulmonary clinic 01/27/2014 by Dr Lisbeth Ply with documented GOLD IV COPD 03/24/14    History of Present Illness  01/27/2014 1st [facility] office visit/ Mariza Bourget  Chief Complaint  Patient presents with  . Pulmonary Consult    Referred by Dr. Daiva Eves. Pt states dxed with COPD approx 10 yrs ago. Pt c/o increased cough and SOB since April 2014. He gets SOB walking minimal distances.  Cough is prod with moderate green sputum.   usual routine > sleeps sitting  up on several pillows / sob when wakes but not typically prematurely or full of phlegm, then immediately use duoneb / uses symbicort 160 last but poor hfa  rec Plan A = automatic =symbicort 160 Take 2 puffs first thing in am and then another 2 puffs about 12 hours later.                                    ipatropium after symbicort and a total of 4 x daily  Plan B = backup  As needed up to every 4 hours, ok to use  neb alb at home or maxair  Prednisone 10 mg take  4 each am x 2 days,   2 each am x 2 days,  1 each am x 2 days and stop  Augmentin 875 mg take one pill twice daily  X 10 days  Work on inhaler technique:       08/11/2014 f/u ov/Kahmari Herard re:  GOLD IV copd/ 02 dep Chief Complaint  Patient presents with  . Follow-up    Pt states that his breathing is doing well. No new co's today.  He has used neb only 2 x since last visit and rarely usues maxair.   walks 50 ft into country store on 02 on 2lpm some piddling around the yard / always uses w/c to get into office rec Work on inhaler technique:   See calendar for specific medication  instructions    11/16/2014 f/u ov/Telesa Jeancharles re: copd / no med calendar Chief Complaint  Patient presents with  . Follow-up    Pt states his breathing is unchanged. No new co's today.   using maxair rarely / not aware of action plan at bottom of med calendar  Still sob with more than slow adls/ only walks 50 ft or so at a time = MMRC 2/3 on 2lpm  rec Work on inhaler technique:    See calendar for specific medication instructions Separating the top medications from the bottom group is fundamental to providing you adequate care going forward.       03/21/2015  f/u ov/Shalen Petrak re: copd GOLD IV/ 02 dep   no change doe = can do a [employer] leaning on cart on 02 but MMRC3 = can't walk 100 yards even at a slow pace at a flat grade s stopping due to sob   Did not bring in med calendar  When having a bad day can do better  p maxair rx but rarely uses it  02 24/7 2 lpm  rec Work on inhaler technique:  See calendar for specific medication instructions      09/09/2015 Follow up : COPD  Pt presents for abnormal CT chest  Recently seen by urology for hematuria found to have lung nodule on CT.  He was set up for a CT chest on 08/31/15 that showed 1.9 cm mass on the left abutting the visceral pleura and complete RML atx , high grade stenosis of proximal right mainstem bronchus w/ notable endobronchial mass. And a Exophytic 2.1cm mass distal body of stomach. No mediastinal or hilar adnenopathy noted.  He was referred to our office to discuss results.   rec Continue Symbicort and Tudorza.  Set up for PET scan for lung nodule > 2.2 SUV     09/28/2015  f/u ov/Akshay Spang re: SPN/ copd GOLD IV/ 02 dep / symb/tudorza  Chief Complaint  Patient presents with  . Follow-up    Cough has improved some since the last visit and sputum back to clear. His breathing is unchanged. He is using maxair  x per wk on average and using neb 1 x per wk on average.   doe = MMRC3 = can't walk 100 yards even at a slow pace at a flat grade s  stopping due to sob  On 02 2lpm 24/7   No obvious day to day or daytime variability or assoc excess/ purulent sputum or mucus plugs or hemoptysis or cp or chest tightness, subjective wheeze or overt sinus or hb symptoms. No unusual exp hx or h/o childhood pna/ asthma or knowledge of premature birth.  Sleeping ok without nocturnal  or early am exacerbation  of respiratory  c/o's or need for noct saba. Also denies any obvious fluctuation of symptoms with weather or environmental changes or other aggravating or alleviating factors except as outlined above   Current Medications, Allergies, Complete Past Medical History, Past Surgical History, Family History, and Social History were reviewed in [employer] record.  ROS  The following are not active complaints unless bolded sore throat, dysphagia, dental problems, itching, sneezing,  nasal congestion or excess/ purulent secretions, ear ache,   fever, chills, sweats, unintended wt loss, classically pleuritic or exertional cp,  orthopnea pnd or leg swelling, presyncope, palpitations, abdominal pain, anorexia, nausea, vomiting, diarrhea  or change in bowel or bladder habits, change in stools or urine, dysuria,hematuria,  rash, arthralgias, visual complaints, headache, numbness, weakness or ataxia or problems with walking or coordination,  change in mood/affect or memory.            .                    Objective:   Physical Exam   amb wm nad walking   on Cane   / vital signs reviewed    03/24/2014       210  >  03/21/2015  214 > 09/28/2015   HEENT mild turbinate edema.  Oropharynx no thrush or excess pnd or cobblestoning.  No JVD or cervical adenopathy. Mild accessory muscle hypertrophy. Trachea midline, nl thryroid. Chest was hyperinflated by percussion with diminished breath sounds and moderate increased exp time without wheeze. Hoover sign positive at mid inspiration. Regular rate and rhythm without murmur gallop or rub ,    Tr  + edema both lower ext  Abd: pot bellied contour, soft/ no hsm, nl excursion. Ext warm without cyanosis or clubbing.  Assessment & Plan:

## 2015-09-28 NOTE — Patient Instructions (Signed)
No change in medications   Please schedule a follow up visit in 3 months but call sooner if needed with cxr  

## 2015-09-29 NOTE — Assessment & Plan Note (Signed)
-   quit smoking 1990s  - PFTs 03/24/2014 FEV1  0.85 (27%) ratio 45 and dlco 26 corrects to 52%  - 03/24/14 trial of LAMA and d/c SAMA = tudorza 1 bid  - 08/11/2014  100% with dpi  - 08/11/2014 declined rehab    -  09/28/2015  p extensive coaching HFA effectiveness =    75% from a baseline of 50%   Remains very severe/ not a candidate at this point for any intervention re SPN  (see separate a/p)   I had an extended discussion with the patient and wife reviewing all relevant studies completed to date and  lasting 15 to 20 minutes of a 25 minute visit    Each maintenance medication was reviewed in detail including most importantly the difference between maintenance and prns and under what circumstances the prns are to be triggered using an action plan format that is not reflected in the computer generated alphabetically organized AVS.    Please see instructions for details which were reviewed in writing and the patient given a copy highlighting the part that I personally wrote and discussed at today's ov.

## 2015-09-29 NOTE — Assessment & Plan Note (Signed)
CT chest on 08/31/15 : 1.9 cm mass on the left abutting the visceral pleura and complete RML atx , high grade stenosis of proximal right mainstem bronchus w/ notable endobronchial mass. And a Exophytic 2.1cm mass distal body of stomach. No mediastinal or hilar adnenopathy noted.  >PET scan [id number]  >hypermetabolic LLL nodule  @ 2.2 suv and neg otherwise   Discussed in detail all the  indications, usual  risks and alternatives  relative to the benefits with patient /wife who agree  to proceed with conservative f/u as outlined  -  If this is a tumor, it is very low activity whereas his copd/resp failure are very high activity/relevance

## 2015-09-29 NOTE — Assessment & Plan Note (Signed)
-   03/24/2014   Walked 2 lpm nl pace but stopped x 2  And completed  one lap @ 185 stopped due to   Sob with adequate sats - 03/24/14 HCO3 = 33 c/w hypercarbia  rx as of 09/28/2015  = 2lpm with walking and sleeping, 1lpm at rest sitting

## 2015-10-04 ENCOUNTER — Ambulatory Visit: Payer: BLUE CROSS/BLUE SHIELD | Admitting: Internal Medicine

## 2015-10-24 DIAGNOSIS — L720 Epidermal cyst: Secondary | ICD-10-CM | POA: Diagnosis not present

## 2015-10-24 DIAGNOSIS — L821 Other seborrheic keratosis: Secondary | ICD-10-CM | POA: Diagnosis not present

## 2015-10-24 DIAGNOSIS — Z85820 Personal history of malignant melanoma of skin: Secondary | ICD-10-CM | POA: Diagnosis not present

## 2015-10-24 DIAGNOSIS — L814 Other melanin hyperpigmentation: Secondary | ICD-10-CM | POA: Diagnosis not present

## 2015-10-24 DIAGNOSIS — Z85828 Personal history of other malignant neoplasm of skin: Secondary | ICD-10-CM | POA: Diagnosis not present

## 2015-12-02 ENCOUNTER — Other Ambulatory Visit: Payer: Self-pay | Admitting: Adult Health

## 2016-01-02 ENCOUNTER — Telehealth: Payer: Self-pay | Admitting: Internal Medicine

## 2016-01-02 DIAGNOSIS — J441 Chronic obstructive pulmonary disease with (acute) exacerbation: Secondary | ICD-10-CM

## 2016-01-02 NOTE — Telephone Encounter (Signed)
Called and spoke with pt and she is aware of order placed for cxr prior to appt.

## 2016-01-02 NOTE — Telephone Encounter (Signed)
Order for the cxr has been placed for the pt prior to his appt with MW tomorrow.  Attempted to call pt to make them aware but no machine and no answer.  Will try back.

## 2016-01-03 ENCOUNTER — Ambulatory Visit (INDEPENDENT_AMBULATORY_CARE_PROVIDER_SITE_OTHER)
Admission: RE | Admit: 2016-01-03 | Discharge: 2016-01-03 | Disposition: A | Discharge status: Home / Self Care | Payer: BLUE CROSS/BLUE SHIELD | Source: Ambulatory Visit | Attending: Internal Medicine | Admitting: Internal Medicine

## 2016-01-03 ENCOUNTER — Encounter: Payer: Self-pay | Admitting: Internal Medicine

## 2016-01-03 ENCOUNTER — Ambulatory Visit (INDEPENDENT_AMBULATORY_CARE_PROVIDER_SITE_OTHER): Payer: BLUE CROSS/BLUE SHIELD | Admitting: Internal Medicine

## 2016-01-03 VITALS — BP 140/70 | HR 87 | Ht 71.0 in | Wt 209.0 lb

## 2016-01-03 DIAGNOSIS — J449 Chronic obstructive pulmonary disease, unspecified: Secondary | ICD-10-CM | POA: Diagnosis not present

## 2016-01-03 DIAGNOSIS — J9612 Chronic respiratory failure with hypercapnia: Secondary | ICD-10-CM

## 2016-01-03 DIAGNOSIS — J441 Chronic obstructive pulmonary disease with (acute) exacerbation: Secondary | ICD-10-CM | POA: Diagnosis not present

## 2016-01-03 DIAGNOSIS — Z87891 Personal history of nicotine dependence: Secondary | ICD-10-CM | POA: Diagnosis not present

## 2016-01-03 MED ORDER — FLUTTER DEVI: Status: DC

## 2016-01-03 NOTE — Progress Notes (Signed)
Subjective:    Patient ID: Alexander Kirby, male    DOB: 05-09-1935,  MRN: [id number]    Brief patient profile:  84 yowm quit smoking 1994 admit to [name] 2014 but improved to point where did not need 02 as of March 2015 but persistent doe x 100 ft on  Level grade got worse April 2015 with sob and cough with production of of green mucus some better p steroid shot referred to pulmonary clinic 01/27/2014 by Dr Lisbeth Ply with documented GOLD IV COPD 03/24/14    History of Present Illness  01/27/2014 1st [facility] office visit/ Alexander Kirby  Chief Complaint  Patient presents with  . Pulmonary Consult    Referred by Dr. Daiva Eves. Pt states dxed with COPD approx 10 yrs ago. Pt c/o increased cough and SOB since April 2014. He gets SOB walking minimal distances.  Cough is prod with moderate green sputum.   usual routine > sleeps sitting  up on several pillows / sob when wakes but not typically prematurely or full of phlegm, then immediately use duoneb / uses symbicort 160 last but poor hfa  rec Plan A = automatic =symbicort 160 Take 2 puffs first thing in am and then another 2 puffs about 12 hours later.                                    ipatropium after symbicort and a total of 4 x daily  Plan B = backup  As needed up to every 4 hours, ok to use  neb alb at home or maxair  Prednisone 10 mg take  4 each am x 2 days,   2 each am x 2 days,  1 each am x 2 days and stop  Augmentin 875 mg take one pill twice daily  X 10 days  Work on inhaler technique:       08/11/2014 f/u ov/[name] re:  GOLD IV copd/ 02 dep Chief Complaint  Patient presents with  . Follow-up    Pt states that his breathing is doing well. No new co's today.  He has used neb only 2 x since last visit and rarely usues maxair.   walks 50 ft into country store on 02 on 2lpm some piddling around the yard / always uses w/c to get into office rec Work on inhaler technique:   See calendar for specific medication  instructions    11/16/2014 f/u ov/[name] re: copd / no med calendar Chief Complaint  Patient presents with  . Follow-up    Pt states his breathing is unchanged. No new co's today.   using maxair rarely / not aware of action plan at bottom of med calendar  Still sob with more than slow adls/ only walks 50 ft or so at a time = MMRC 2/3 on 2lpm  rec Work on inhaler technique:    See calendar for specific medication instructions Separating the top medications from the bottom group is fundamental to providing you adequate care going forward.       03/21/2015  f/u ov/[name] re: copd GOLD IV/ 02 dep   no change doe = can do a [employer] leaning on cart on 02 but MMRC3 = can't walk 100 yards even at a slow pace at a flat grade s stopping due to sob   Did not bring in med calendar  When having a bad day can do better  p maxair rx but rarely uses it  02 24/7 2 lpm  rec Work on inhaler technique:  See calendar for specific medication instructions      09/09/2015 Follow up : COPD  Pt presents for abnormal CT chest  Recently seen by urology for hematuria found to have lung nodule on CT.  He was set up for a CT chest on 08/31/15 that showed 1.9 cm mass on the left abutting the visceral pleura and complete RML atx , high grade stenosis of proximal right mainstem bronchus w/ notable endobronchial mass. And a Exophytic 2.1cm mass distal body of stomach. No mediastinal or hilar adnenopathy noted.  He was referred to our office to discuss results.   rec Continue Symbicort and Tudorza.  Set up for PET scan for lung nodule > 2.2 SUV         01/03/2016  f/u ov/[name] re: GOLD  IV copd 2lpm but with activity  3lpm / on symb tudorza  Chief Complaint  Patient presents with  . Follow-up  alb 2-3 x per week  Neb / lots of am congestion but mucus is white/ doesn't know if still has flutter valve Doe = MMRC3 = can't walk 100 yards even at a slow pace at a flat grade s stopping due to sob  Even on 02 3lpm      No obvious day to day or daytime variability or assoc  purulent sputum or mucus plugs or hemoptysis or cp or chest tightness, subjective wheeze or overt sinus or hb symptoms. No unusual exp hx or h/o childhood pna/ asthma or knowledge of premature birth.  Sleeping ok without nocturnal  or early am exacerbation  of respiratory  c/o's or need for noct saba. Also denies any obvious fluctuation of symptoms with weather or environmental changes or other aggravating or alleviating factors except as outlined above   Current Medications, Allergies, Complete Past Medical History, Past Surgical History, Family History, and Social History were reviewed in [employer] record.  ROS  The following are not active complaints unless bolded sore throat, dysphagia, dental problems, itching, sneezing,  nasal congestion or excess/ purulent secretions, ear ache,   fever, chills, sweats, unintended wt loss, classically pleuritic or exertional cp,  orthopnea pnd or leg swelling, presyncope, palpitations, abdominal pain, anorexia, nausea, vomiting, diarrhea  or change in bowel or bladder habits, change in stools or urine, dysuria,hematuria,  rash, arthralgias, visual complaints, headache, numbness, weakness or ataxia or problems with walking or coordination,  change in mood/affect or memory.              Objective:  Physical Exam   amb wm nad w/c    / vital signs reviewed - note sats 97% on 2lpm     03/24/2014       210  >  03/21/2015  214 > 09/28/2015  > 01/03/2016   209    HEENT mild turbinate edema.  Oropharynx no thrush or excess pnd or cobblestoning.  No JVD or cervical adenopathy. Mild accessory muscle hypertrophy. Trachea midline, nl thryroid. Chest was hyperinflated by percussion with diminished breath sounds and moderate increased exp time without wheeze. Hoover sign positive at mid inspiration. Regular rate and rhythm without murmur gallop or rub ,   1+ pitting edema both lower ext   Abd: pot bellied contour, soft/ no hsm, nl excursion. Ext warm without cyanosis or clubbing.         CXR PA and Lateral:   01/03/2016 :  I personally reviewed images and agree with radiology impression as follows:    1. Stable left mid lung base nodule compared to chest x-ray from 2015. 2. Basilar pulmonary fibrosis and changes of emphysema. 3. Stable partially compressed L1 vertebral body.      Assessment & Plan:

## 2016-01-03 NOTE — Patient Instructions (Signed)
No change in medications though you may find the flutter valve helps with the congested cough    See calendar for specific medication instructions and bring it back for each and every office visit for every healthcare provider you see.  Without it,  you may not receive the best quality medical care that we feel you deserve.  You will note that the calendar groups together  your maintenance  medications that are timed at particular times of the day.  Think of this as your checklist for what your doctor has instructed you to do until your next evaluation to see what benefit  there is  to staying on a consistent group of medications intended to keep you well.  The other group at the bottom is entirely up to you to use as you see fit  for specific symptoms that may arise between visits that require you to treat them on an as needed basis.  Think of this as your action plan or "what if" list.   Separating the top medications from the bottom group is fundamental to providing you adequate care going forward.

## 2016-01-04 ENCOUNTER — Encounter: Payer: Self-pay | Admitting: Internal Medicine

## 2016-01-04 NOTE — Assessment & Plan Note (Addendum)
-   03/24/2014   Walked 2 lpm nl pace but stopped x 2  And completed  one lap @ 185 stopped due to   Sob with adequate sats - 03/24/14 HCO3 = 33 c/w hypercarbia  Adequate control on present rx, reviewed > no change in rx needed   rx as of 01/03/2016  = 2lpm at rest and sleeping and 3lpm with activity

## 2016-01-04 NOTE — Assessment & Plan Note (Signed)
-   quit smoking 1990s  - PFTs 03/24/2014 FEV1  0.85 (27%) ratio 45 and dlco 26 corrects to 52%  - 03/24/14 trial of LAMA and d/c SAMA = tudorza 1 bid  - 08/11/2014  100% with dpi  - 08/11/2014 declined rehab  -  09/28/2015  p extensive coaching HFA effectiveness =    75% from a baseline of 50% - re- instructed on flutter 01/03/2016    Severe but well compensated at rest on present rx including 24h 02  Needs better cough mechanics > flutter should help but "can't find it anymore (note was on med calendar that he was supposed to be using it)  I had an extended discussion with the patient reviewing all relevant studies completed to date and  lasting 15 to 20 minutes of a 25 minute visit    Each maintenance medication was reviewed in detail including most importantly the difference between maintenance and prns and under what circumstances the prns are to be triggered using an action plan format that is not reflected in the computer generated alphabetically organized AVS but trather by a customized med calendar that reflects the AVS meds with confirmed 100% correlation.   Please see instructions for details which were reviewed in writing and the patient given a copy highlighting the part that I personally wrote and discussed at today's ov.

## 2016-01-24 DIAGNOSIS — E1165 Type 2 diabetes mellitus with hyperglycemia: Secondary | ICD-10-CM | POA: Diagnosis not present

## 2016-01-24 DIAGNOSIS — E039 Hypothyroidism, unspecified: Secondary | ICD-10-CM | POA: Diagnosis not present

## 2016-01-24 DIAGNOSIS — E7800 Pure hypercholesterolemia, unspecified: Secondary | ICD-10-CM | POA: Diagnosis not present

## 2016-01-26 DIAGNOSIS — M25421 Effusion, right elbow: Secondary | ICD-10-CM | POA: Diagnosis not present

## 2016-01-26 DIAGNOSIS — E1165 Type 2 diabetes mellitus with hyperglycemia: Secondary | ICD-10-CM | POA: Diagnosis not present

## 2016-01-26 DIAGNOSIS — Z23 Encounter for immunization: Secondary | ICD-10-CM | POA: Diagnosis not present

## 2016-01-26 DIAGNOSIS — E7800 Pure hypercholesterolemia, unspecified: Secondary | ICD-10-CM | POA: Diagnosis not present

## 2016-01-26 DIAGNOSIS — I10 Essential (primary) hypertension: Secondary | ICD-10-CM | POA: Diagnosis not present

## 2016-01-26 DIAGNOSIS — M109 Gout, unspecified: Secondary | ICD-10-CM | POA: Diagnosis not present

## 2016-01-26 DIAGNOSIS — M7989 Other specified soft tissue disorders: Secondary | ICD-10-CM | POA: Diagnosis not present

## 2016-01-26 DIAGNOSIS — Z86711 Personal history of pulmonary embolism: Secondary | ICD-10-CM | POA: Diagnosis not present

## 2016-01-26 DIAGNOSIS — Z6833 Body mass index (BMI) 33.0-33.9, adult: Secondary | ICD-10-CM | POA: Diagnosis not present

## 2016-01-26 DIAGNOSIS — N183 Chronic kidney disease, stage 3 (moderate): Secondary | ICD-10-CM | POA: Diagnosis not present

## 2016-01-27 ENCOUNTER — Other Ambulatory Visit: Payer: Self-pay | Admitting: Family Medicine

## 2016-01-27 DIAGNOSIS — M79601 Pain in right arm: Secondary | ICD-10-CM

## 2016-01-30 DIAGNOSIS — M7989 Other specified soft tissue disorders: Secondary | ICD-10-CM | POA: Diagnosis not present

## 2016-04-03 ENCOUNTER — Ambulatory Visit (INDEPENDENT_AMBULATORY_CARE_PROVIDER_SITE_OTHER): Payer: BLUE CROSS/BLUE SHIELD | Admitting: Internal Medicine

## 2016-04-03 ENCOUNTER — Ambulatory Visit (INDEPENDENT_AMBULATORY_CARE_PROVIDER_SITE_OTHER)
Admission: RE | Admit: 2016-04-03 | Discharge: 2016-04-03 | Disposition: A | Discharge status: Home / Self Care | Payer: BLUE CROSS/BLUE SHIELD | Source: Ambulatory Visit | Attending: Internal Medicine | Admitting: Internal Medicine

## 2016-04-03 ENCOUNTER — Encounter: Payer: Self-pay | Admitting: Internal Medicine

## 2016-04-03 VITALS — BP 132/66 | HR 85 | Ht 72.0 in | Wt 200.0 lb

## 2016-04-03 DIAGNOSIS — J449 Chronic obstructive pulmonary disease, unspecified: Secondary | ICD-10-CM | POA: Diagnosis not present

## 2016-04-03 DIAGNOSIS — R911 Solitary pulmonary nodule: Secondary | ICD-10-CM

## 2016-04-03 DIAGNOSIS — Z23 Encounter for immunization: Secondary | ICD-10-CM | POA: Diagnosis not present

## 2016-04-03 DIAGNOSIS — J9612 Chronic respiratory failure with hypercapnia: Secondary | ICD-10-CM

## 2016-04-03 NOTE — Patient Instructions (Addendum)
You may need to change the nifedipine to an alternative but ok to leave on for now (may be contributing to leg swelling)   Please remember to go to the   x-ray department downstairs for your tests - we will call you with the results when they are available.  Please schedule a follow up visit in 3 months but call sooner if needed

## 2016-04-03 NOTE — Progress Notes (Signed)
Subjective:    Patient ID: Alexander Kirby, male    DOB: 1935/02/18,  MRN: [id number]    Brief patient profile:  44 yowm quit smoking 1994 admit to [facility] 2014 but improved to point where did not need 02 as of March 2015 but persistent doe x 100 ft on  Level grade got worse April 2015 with sob and cough with production of of green mucus some better p steroid shot referred to pulmonary clinic 01/27/2014 by Dr Lisbeth Ply with documented GOLD IV COPD 03/24/14      History of Present Illness  01/27/2014 1st [place] [facility] office visit/ Wert  Chief Complaint  Patient presents with  . Pulmonary Consult    Referred by Dr. Daiva Eves. Pt states dxed with COPD approx 10 yrs ago. Pt c/o increased cough and SOB since April 2014. He gets SOB walking minimal distances.  Cough is prod with moderate green sputum.   usual routine > sleeps sitting  up on several pillows / sob when wakes but not typically prematurely or full of phlegm, then immediately use duoneb / uses symbicort 160 last but poor hfa  rec Plan A = automatic =symbicort 160 Take 2 puffs first thing in am and then another 2 puffs about 12 hours later.                                    ipatropium after symbicort and a total of 4 x daily  Plan B = backup  As needed up to every 4 hours, ok to use  neb alb at home or maxair  Prednisone 10 mg take  4 each am x 2 days,   2 each am x 2 days,  1 each am x 2 days and stop  Augmentin 875 mg take one pill twice daily  X 10 days  Work on inhaler technique:           09/09/2015 NP Follow up : COPD  Pt presents for abnormal CT chest  Recently seen by urology for hematuria found to have lung nodule on CT.  He was set up for a CT chest on 08/31/15 that showed 1.9 cm mass on the left abutting the visceral pleura and complete RML atx , high grade stenosis of proximal right mainstem bronchus w/ notable endobronchial mass. And a Exophytic 2.1cm mass distal body of stomach. No mediastinal or hilar  adnenopathy noted.  He was referred to our office to discuss results.   rec Continue Symbicort and Tudorza.  Set up for PET scan for lung nodule > 2.2 SUV     04/03/2016  f/u ov/Wert re: GOLD IV copd / 2lpm except 3lpm with activity/ symb/tudorza Chief Complaint  Patient presents with  . Follow-up    Pt states his breathing is about the same. Humid weather seems to make breathing worse. He is using maxair 2 x daily on average and neb once per wk on average.  MMRC3 = can't walk 100 yards even at a slow pace at a flat grade s stopping due to sob  Even on 02         No obvious day to day or daytime variability or assoc  purulent sputum or mucus plugs or hemoptysis or cp or chest tightness, subjective wheeze or overt sinus or hb symptoms. No unusual exp hx or h/o childhood pna/ asthma or knowledge of premature birth.  Sleeping ok  without nocturnal  or early am exacerbation  of respiratory  c/o's or need for noct saba. Also denies any obvious fluctuation of symptoms with weather or environmental changes or other aggravating or alleviating factors except as outlined above   Current Medications, Allergies, Complete Past Medical History, Past Surgical History, Family History, and Social History were reviewed in [employer] record.  ROS  The following are not active complaints unless bolded sore throat, dysphagia, dental problems, itching, sneezing,  nasal congestion or excess/ purulent secretions, ear ache,   fever, chills, sweats, unintended wt loss, classically pleuritic or exertional cp,  orthopnea pnd or leg swelling, presyncope, palpitations, abdominal pain, anorexia, nausea, vomiting, diarrhea  or change in bowel or bladder habits, change in stools or urine, dysuria,hematuria,  rash, arthralgias, visual complaints, headache, numbness, weakness or ataxia or problems with walking or coordination,  change in mood/affect or memory.              Objective:  Physical  Exam   amb wm nad w/c    / vital signs reviewed -  - Note on arrival 02 sats  96% on 2lpm      03/24/2014       210  >  03/21/2015  214 > 09/28/2015  > 01/03/2016   209  > 04/03/2016 200    HEENT mild turbinate edema.  Oropharynx no thrush or excess pnd or cobblestoning.  No JVD or cervical adenopathy. Mild accessory muscle hypertrophy. Trachea midline, nl thryroid. Chest was hyperinflated by percussion with very  diminished breath sounds bilaterally and moderate increased exp time without wheeze. Hoover sign positive at mid inspiration. Regular rate and rhythm without murmur gallop or rub ,   1+ pitting edema both lower ext  Abd: pot bellied contour, soft/ no hsm, nl excursion. Ext warm without cyanosis or clubbing.       CXR PA and Lateral:   04/03/2016 :    I personally reviewed images and agree with radiology impression as follows:   COPD changes with stable LEFT mid lung nodule and basilar interstitial disease.  No acute abnormalities.      Assessment & Plan:

## 2016-04-04 NOTE — Progress Notes (Signed)
ATC, NA and no option to leave msg, thanks

## 2016-04-05 NOTE — Progress Notes (Signed)
atc na and no option to leave msg

## 2016-04-08 ENCOUNTER — Encounter: Payer: Self-pay | Admitting: Internal Medicine

## 2016-04-08 NOTE — Assessment & Plan Note (Signed)
CT chest on 08/31/15 : 1.9 cm mass on the left abutting the visceral pleura and complete RML atx , high grade stenosis of proximal right mainstem bronchus w/ notable endobronchial mass. And a Exophytic 2.1cm mass distal body of stomach. No mediastinal or hilar adnenopathy noted.  >PET scan [id number]  >hypermetabolic LLL nodule  @ 2.2 suv and neg otherwise  No growth seen/ no symptoms  I had an extended discussion with the patient reviewing all relevant studies completed to date and  lasting 15 to 20 minutes of a 25 minute visit on the following ongoing concerns:   Discussed in detail all the  indications, usual  risks and alternatives  relative to the benefits with patient who agrees to proceed with conservative f/u as outlined  And if symptoms or growth then consider attempt bx/ palliative RT

## 2016-04-08 NOTE — Assessment & Plan Note (Signed)
-   03/24/2014   Walked 2 lpm nl pace but stopped x 2  And completed  one lap @ 185 stopped due to   Sob with adequate sats - 03/24/14 HCO3 = 33 c/w hypercarbia   rx as of 04/03/2016  = 2lpm at rest and sleeping and 3lpm with activity

## 2016-04-08 NOTE — Assessment & Plan Note (Signed)
-   quit smoking 1990s  - PFTs 03/24/2014 FEV1  0.85 (27%) ratio 45 and dlco 26 corrects to 52%  - 03/24/14 trial of LAMA and d/c SAMA = tudorza 1 bid  - 08/11/2014  100% with dpi  - 08/11/2014 declined rehab  -  09/28/2015  p extensive coaching HFA effectiveness =    75% from a baseline of 50% - re- instructed on flutter 01/03/2016     Group D in terms of symptom/risk and laba/lama/ICS  therefore appropriate rx at this point.   nothing else to offer / Each maintenance medication was reviewed in detail including most importantly the difference between maintenance and as needed and under what circumstances the prns are to be used.  Please see instructions for details which were reviewed in writing and the patient given a copy.

## 2016-04-10 NOTE — Progress Notes (Signed)
ATC, NA and no VM  

## 2016-05-21 ENCOUNTER — Telehealth: Payer: Self-pay | Admitting: Internal Medicine

## 2016-05-21 NOTE — Telephone Encounter (Signed)
lmtcb x1 for pt. 

## 2016-05-21 NOTE — Telephone Encounter (Signed)
Spoke with pt wife who states tudorza requires a PA, however spiriva and incruse are covered alternatives.  MW please advise. Thanks.

## 2016-05-21 NOTE — Telephone Encounter (Signed)
Try incruse one click each am

## 2016-05-22 MED ORDER — UMECLIDINIUM BROMIDE 62.5 MCG/INH IN AEPB: 1.0000 | INHALATION_SPRAY | Freq: Every day | RESPIRATORY_TRACT | 0 refills | Status: DC

## 2016-05-22 NOTE — Telephone Encounter (Signed)
Spoke with pt's wife. She is aware of MW's recommendations. She asks that I send a 30 day supply to their local pharmacy. Rx has been sent in. Nothing further was needed.

## 2016-05-22 NOTE — Telephone Encounter (Signed)
Patient is returning call.  °

## 2016-06-05 DIAGNOSIS — Z5329 Procedure and treatment not carried out because of patient's decision for other reasons: Secondary | ICD-10-CM | POA: Diagnosis not present

## 2016-06-05 DIAGNOSIS — E1165 Type 2 diabetes mellitus with hyperglycemia: Secondary | ICD-10-CM | POA: Diagnosis not present

## 2016-06-05 DIAGNOSIS — Z6832 Body mass index (BMI) 32.0-32.9, adult: Secondary | ICD-10-CM | POA: Diagnosis not present

## 2016-06-05 DIAGNOSIS — E669 Obesity, unspecified: Secondary | ICD-10-CM | POA: Diagnosis not present

## 2016-06-05 DIAGNOSIS — N183 Chronic kidney disease, stage 3 (moderate): Secondary | ICD-10-CM | POA: Diagnosis not present

## 2016-06-05 DIAGNOSIS — Z9181 History of falling: Secondary | ICD-10-CM | POA: Diagnosis not present

## 2016-06-05 DIAGNOSIS — J449 Chronic obstructive pulmonary disease, unspecified: Secondary | ICD-10-CM | POA: Diagnosis not present

## 2016-06-05 DIAGNOSIS — M109 Gout, unspecified: Secondary | ICD-10-CM | POA: Diagnosis not present

## 2016-06-05 DIAGNOSIS — E039 Hypothyroidism, unspecified: Secondary | ICD-10-CM | POA: Diagnosis not present

## 2016-06-05 DIAGNOSIS — Z1389 Encounter for screening for other disorder: Secondary | ICD-10-CM | POA: Diagnosis not present

## 2016-07-03 ENCOUNTER — Ambulatory Visit (INDEPENDENT_AMBULATORY_CARE_PROVIDER_SITE_OTHER): Payer: BLUE CROSS/BLUE SHIELD | Admitting: Internal Medicine

## 2016-07-03 ENCOUNTER — Encounter: Payer: Self-pay | Admitting: Internal Medicine

## 2016-07-03 VITALS — BP 152/80 | HR 87 | Ht 71.0 in | Wt 209.0 lb

## 2016-07-03 DIAGNOSIS — R911 Solitary pulmonary nodule: Secondary | ICD-10-CM

## 2016-07-03 DIAGNOSIS — J9612 Chronic respiratory failure with hypercapnia: Secondary | ICD-10-CM | POA: Diagnosis not present

## 2016-07-03 DIAGNOSIS — J449 Chronic obstructive pulmonary disease, unspecified: Secondary | ICD-10-CM

## 2016-07-03 MED ORDER — TIOTROPIUM BROMIDE MONOHYDRATE 2.5 MCG/ACT IN AERS: 2.0000 | INHALATION_SPRAY | Freq: Every day | RESPIRATORY_TRACT | 3 refills | Status: DC

## 2016-07-03 MED ORDER — TIOTROPIUM BROMIDE MONOHYDRATE 2.5 MCG/ACT IN AERS: 2.0000 | INHALATION_SPRAY | Freq: Every day | RESPIRATORY_TRACT | 11 refills | Status: DC

## 2016-07-03 MED ORDER — TIOTROPIUM BROMIDE MONOHYDRATE 1.25 MCG/ACT IN AERS: 2.0000 | INHALATION_SPRAY | Freq: Every day | RESPIRATORY_TRACT | 0 refills | Status: DC

## 2016-07-03 NOTE — Patient Instructions (Addendum)
Stop tudorza and incruse and use spiriva 1.25 x 4 puffs each am until use it up then spiriva 2.5  X 2 puffs each am   Work on inhaler technique:  relax and gently blow all the way out then take a nice smooth deep breath back in, triggering the inhaler at same time you start breathing in.  Hold for up to 5 seconds if you can. Blow out thru nose. Rinse and gargle with water when done     Please schedule a follow up visit in 3 months but call sooner if needed with a cxr on return

## 2016-07-03 NOTE — Progress Notes (Signed)
Subjective:    Patient ID: Alexander Kirby, male    DOB: 1935/01/08,  MRN: [id number]    Brief patient profile:  81yowm quit smoking 1994 admit to [facility] 2014 but improved to point where did not need 02 as of March 2015 but persistent doe x 100 ft on  Level grade got worse April 2015 with sob and cough with production of of green mucus some better p steroid shot referred to pulmonary clinic 01/27/2014 by Dr Lisbeth Ply with documented GOLD IV COPD 03/24/14      History of Present Illness  01/27/2014 1st [facility] office visit/ Zamier Eggebrecht  Chief Complaint  Patient presents with  . Pulmonary Consult    Referred by Dr. Daiva Eves. Pt states dxed with COPD approx 10 yrs ago. Pt c/o increased cough and SOB since April 2014. He gets SOB walking minimal distances.  Cough is prod with moderate green sputum.   usual routine > sleeps sitting  up on several pillows / sob when wakes but not typically prematurely or full of phlegm, then immediately use duoneb / uses symbicort 160 last but poor hfa  rec Plan A = automatic =symbicort 160 Take 2 puffs first thing in am and then another 2 puffs about 12 hours later.                                    ipatropium after symbicort and a total of 4 x daily  Plan B = backup  As needed up to every 4 hours, ok to use  neb alb at home or maxair  Prednisone 10 mg take  4 each am x 2 days,   2 each am x 2 days,  1 each am x 2 days and stop  Augmentin 875 mg take one pill twice daily  X 10 days  Work on inhaler technique:           09/09/2015 NP Follow up : COPD  Pt presents for abnormal CT chest  Recently seen by urology for hematuria found to have lung nodule on CT.  He was set up for a CT chest on 08/31/15 that showed 1.9 cm mass on the left abutting the visceral pleura and complete RML atx , high grade stenosis of proximal right mainstem bronchus w/ notable endobronchial mass. And a Exophytic 2.1cm mass distal body of stomach. No mediastinal or hilar adnenopathy  noted.  He was referred to our office to discuss results.   rec Continue Symbicort and Tudorza.  Set up for PET scan for lung nodule > 2.2 SUV     04/03/2016  f/u ov/Tykeem Lanzer re: GOLD IV copd / 2lpm except 3lpm with activity/ symb/tudorza Chief Complaint  Patient presents with  . Follow-up    Pt states his breathing is about the same. Humid weather seems to make breathing worse. He is using maxair 2 x daily on average and neb once per wk on average.  MMRC3 = can't walk 100 yards even at a slow pace at a flat grade s stopping due to sob  Even on 02  rec You may need to change the nifedipine to an alternative but ok to leave on for now (may be contributing to leg swelling)   .    07/03/2016  f/u ov/Vercie Pokorny re:   GOLD IV copd/ 2lpm 24/7 but  3lpm with acitivity symb/ tudorza  Chief Complaint  Patient presents with  .  Follow-up    Pt stated he has SOB and coughes up yellow mucus. He is also following up for his lung nodule  didn't do as well on incruse so back to [place] but not in list  Some am coughing but min vol/ min discoloration  Doe still = MMRC3 even on 02    No obvious day to day or daytime variability or assoc   mucus plugs or hemoptysis or cp or chest tightness, subjective wheeze or overt sinus or hb symptoms. No unusual exp hx or h/o childhood pna/ asthma or knowledge of premature birth.  Sleeping ok without nocturnal  or early am exacerbation  of respiratory  c/o's or need for noct saba. Also denies any obvious fluctuation of symptoms with weather or environmental changes or other aggravating or alleviating factors except as outlined above   Current Medications, Allergies, Complete Past Medical History, Past Surgical History, Family History, and Social History were reviewed in [employer] record.  ROS  The following are not active complaints unless bolded sore throat, dysphagia, dental problems, itching, sneezing,  nasal congestion or excess/ purulent  secretions, ear ache,   fever, chills, sweats, unintended wt loss, classically pleuritic or exertional cp,  orthopnea pnd or leg swelling better  presyncope, palpitations, abdominal pain, anorexia, nausea, vomiting, diarrhea  or change in bowel or bladder habits, change in stools or urine, dysuria,hematuria,  rash, arthralgias, visual complaints, headache, numbness, weakness or ataxia or problems with walking or coordination,  change in mood/affect or memory.              Objective:  Physical Exam   amb wm nad w/c    / vital signs reviewed -  - Note on arrival 02 sats  98% on 2lpm and BP 152/80    03/24/2014     210  >  03/21/2015  214 > 09/28/2015  > 01/03/2016   209  > 04/03/2016 200 >  07/03/2016  209   HEENT mild turbinate edema.  Oropharynx no thrush or excess pnd or cobblestoning.  No JVD or cervical adenopathy. Mild accessory muscle hypertrophy. Trachea midline, nl thryroid. Chest was hyperinflated by percussion with very  diminished breath sounds bilaterally and moderate increased exp time without wheeze. Hoover sign positive at mid inspiration. Regular rate and rhythm without murmur gallop or rub ,  Trace  pitting edema both lower ext  Abd: pot bellied contour, soft/ no hsm, nl excursion. Ext warm without cyanosis or clubbing.               Assessment & Plan:

## 2016-07-04 ENCOUNTER — Ambulatory Visit: Payer: BLUE CROSS/BLUE SHIELD | Admitting: Internal Medicine

## 2016-07-04 NOTE — Assessment & Plan Note (Addendum)
-   quit smoking 1990s  - PFTs 03/24/2014 FEV1  0.85 (27%) ratio 45 and dlco 26 corrects to 52%  - 03/24/14 trial of LAMA and d/c SAMA = tudorza 1 bid  - 08/11/2014  100% with dpi  - 08/11/2014 declined rehab  - re- instructed on flutter 01/03/2016    - 07/03/2016  After extensive coaching device  effectiveness =    90% on respimat> try spiriva in place of tudorza which is not covered on plan    Has very severe copd and Group D in terms of symptom/risk and laba/lama/ICS  therefore appropriate rx at this point and spiriva/symbicort with adequate technique demonstrated is best fit here.   I had an extended discussion with the patient and his wife reviewing all relevant studies completed to date and  lasting 15 to 20 minutes of a 25 minute visit    Each maintenance medication was reviewed in detail including most importantly the difference between maintenance and prns and under what circumstances the prns are to be triggered using an action plan format that is not reflected in the computer generated alphabetically organized AVS.    Please see AVS for specific instructions unique to this visit that I personally wrote and verbalized to the the pt in detail and then reviewed with pt  by my nurse highlighting any  changes in therapy recommended at today's visit to their plan of care.

## 2016-07-04 NOTE — Assessment & Plan Note (Signed)
-   03/24/2014   Walked 2 lpm nl pace but stopped x 2  And completed  one lap @ 185 stopped due to   Sob with adequate sats - 03/24/14 HCO3 = 33 c/w hypercarbia   rx as of 07/03/2016  = 2lpm at rest and sleeping and 3lpm with activity

## 2016-07-04 NOTE — Assessment & Plan Note (Signed)
CT chest on 08/31/15 : 1.9 cm mass on the left abutting the visceral pleura and complete RML atx , high grade stenosis of proximal right mainstem bronchus w/ notable endobronchial mass. And a Exophytic 2.1cm mass distal body of stomach. No mediastinal or hilar adnenopathy noted.  >PET scan [id number]  >hypermetabolic LLL nodule  @ 2.2 suv and neg otherwise   Discussed in detail all the  indications, usual  risks and alternatives  relative to the benefits with patient/wife who agrees to proceed with conservative f/u - if not having symptoms does not want to attempt bx or even RT

## 2016-07-06 ENCOUNTER — Telehealth: Payer: Self-pay | Admitting: Internal Medicine

## 2016-07-06 NOTE — Telephone Encounter (Signed)
Spoke with pt's wife, Jana Half. I have clarified what inhalers the pt should be taking. Nothing further was needed.

## 2016-08-20 ENCOUNTER — Telehealth: Payer: Self-pay | Admitting: Internal Medicine

## 2016-08-20 MED ORDER — ALBUTEROL SULFATE HFA 108 (90 BASE) MCG/ACT IN AERS
2.0000 | INHALATION_SPRAY | Freq: Four times a day (QID) | RESPIRATORY_TRACT | 3 refills | Status: DC | PRN
Start: 2016-08-20 — End: 2017-06-05

## 2016-08-20 NOTE — Telephone Encounter (Signed)
Spoke with pt's spouse, who is requesting refills for proair. Pt was previously taken maxair but it has been discontinued. Maxair was prescribed by pt's prior PCP. Pt was then prescribed proair in place of maxair by PCP.Pt is no longer under this providers care. pt's spouse is requesting Rx for 90 day supply, with 3 refills to [facility].   MW please advise. Thanks.   07/03/16 Patient Instructions    Stop tudorza and incruse and use spiriva 1.25 x 4 puffs each am until use it up then spiriva 2.5  X 2 puffs each am   Work on inhaler technique:  relax and gently blow all the way out then take a nice smooth deep breath back in, triggering the inhaler at same time you start breathing in.  Hold for up to 5 seconds if you can. Blow out thru nose. Rinse and gargle with water when done     Please schedule a follow up visit in 3 months but call sooner if needed with a cxr on return

## 2016-08-20 NOTE — Telephone Encounter (Signed)
Rx for proair sent to [facility] for 90 day supply  Left spouse a detailed msg explaining this

## 2016-08-20 NOTE — Telephone Encounter (Signed)
Ok to refill as he requests

## 2016-10-02 ENCOUNTER — Ambulatory Visit (INDEPENDENT_AMBULATORY_CARE_PROVIDER_SITE_OTHER)
Admission: RE | Admit: 2016-10-02 | Discharge: 2016-10-02 | Disposition: A | Discharge status: Home / Self Care | Payer: BLUE CROSS/BLUE SHIELD | Source: Ambulatory Visit | Attending: Internal Medicine | Admitting: Internal Medicine

## 2016-10-02 ENCOUNTER — Ambulatory Visit (INDEPENDENT_AMBULATORY_CARE_PROVIDER_SITE_OTHER): Payer: BLUE CROSS/BLUE SHIELD | Admitting: Internal Medicine

## 2016-10-02 ENCOUNTER — Encounter: Payer: Self-pay | Admitting: Internal Medicine

## 2016-10-02 VITALS — BP 132/74 | HR 78 | Ht 71.0 in | Wt 200.0 lb

## 2016-10-02 DIAGNOSIS — J449 Chronic obstructive pulmonary disease, unspecified: Secondary | ICD-10-CM

## 2016-10-02 DIAGNOSIS — J9612 Chronic respiratory failure with hypercapnia: Secondary | ICD-10-CM | POA: Diagnosis not present

## 2016-10-02 DIAGNOSIS — R911 Solitary pulmonary nodule: Secondary | ICD-10-CM

## 2016-10-02 MED ORDER — TIOTROPIUM BROMIDE MONOHYDRATE 1.25 MCG/ACT IN AERS: 4.0000 | INHALATION_SPRAY | Freq: Every day | RESPIRATORY_TRACT | 0 refills | Status: DC

## 2016-10-02 NOTE — Patient Instructions (Addendum)
Stop tudorza  use spiriva 1.25 x 4 puffs each am until use it up then spiriva 2.5  X 2 puffs each am    Work on inhaler technique:  relax and gently blow all the way out then take a nice smooth deep breath back in, triggering the inhaler at same time you start breathing in.  Hold for up to 5 seconds if you can. Blow out thru nose. Rinse and gargle with water when done     Please schedule a follow up visit in 3 months but call sooner if needed

## 2016-10-02 NOTE — Progress Notes (Signed)
Subjective:    Patient ID: Alexander Kirby, male    DOB: November 19, 1934,  MRN: [id number]    Brief patient profile:  81yowm quit smoking 1994 admit to [facility] 2014 but improved to point where did not need 02 as of March 2015 but persistent doe x 100 ft on  Level grade got worse April 2015 with sob and cough with production of of green mucus some better p steroid shot referred to pulmonary clinic 01/27/2014 by Dr Lisbeth Ply with documented GOLD IV COPD 03/24/14      History of Present Illness  01/27/2014 1st [facility] office visit/ Elridge Stemm  Chief Complaint  Patient presents with  . Pulmonary Consult    Referred by Dr. Daiva Eves. Pt states dxed with COPD approx 10 yrs ago. Pt c/o increased cough and SOB since April 2014. He gets SOB walking minimal distances.  Cough is prod with moderate green sputum.   usual routine > sleeps sitting  up on several pillows / sob when wakes but not typically prematurely or full of phlegm, then immediately use duoneb / uses symbicort 160 last but poor hfa  rec Plan A = automatic =symbicort 160 Take 2 puffs first thing in am and then another 2 puffs about 12 hours later.                                    ipatropium after symbicort and a total of 4 x daily  Plan B = backup  As needed up to every 4 hours, ok to use  neb alb at home or maxair  Prednisone 10 mg take  4 each am x 2 days,   2 each am x 2 days,  1 each am x 2 days and stop  Augmentin 875 mg take one pill twice daily  X 10 days  Work on inhaler technique:           09/09/2015 NP Follow up : COPD  Pt presents for abnormal CT chest  Recently seen by urology for hematuria found to have lung nodule on CT.  He was set up for a CT chest on 08/31/15 that showed 1.9 cm mass on the left abutting the visceral pleura and complete RML atx , high grade stenosis of proximal right mainstem bronchus w/ notable endobronchial mass. And a Exophytic 2.1cm mass distal body of stomach. No mediastinal or hilar adnenopathy  noted.  He was referred to our office to discuss results.   rec Continue Symbicort and Tudorza.  Set up for PET scan for lung nodule > 2.2 SUV        .    07/03/2016  f/u ov/Feven Alderfer re:   GOLD IV copd/ 2lpm 24/7 but  3lpm with acitivity symb/ tudorza  Chief Complaint  Patient presents with  . Follow-up    Pt stated he has SOB and coughes up yellow mucus. He is also following up for his lung nodule  didn't do as well on incruse so back to [place] but not in list  Some am coughing but min vol/ min discoloration  Doe still = MMRC3 even on 02  rec Stop tudorza and incruse and use spiriva 1.25 x 4 puffs each am until use it up then spiriva 2.5  X 2 puffs each am  Work on inhaler technique:  relax and gently blow all the way out then take a nice smooth deep breath back  in, triggering the inhaler at same time you start breathing in.  Hold for up to 5 seconds if you can. Blow out thru nose. Rinse and gargle with water when done Please schedule a follow up visit in 3 months but call sooner if needed with a cxr on return      10/02/2016  f/u ov/Dyan Creelman re:  GOLD IV copd/ tudorza and symbicort 160 2bid  Chief Complaint  Patient presents with  . Follow-up    Pt c/o increased SOB, but unsure for how long.  He is not coughing much, but occ produces some yellow sputum.    doe still MMRC 3 even on 2lpm /  Proair use may twice daily / neb maybe twice weekly  ? Reaction to spiriva (muscle aches) have not occurred on tudorza but never rechallenged with spiriva and insurance won't pay for tudorza  No obvious day to day or daytime variability or assoc  mucus plugs or hemoptysis or cp or chest tightness, subjective wheeze or overt sinus or hb symptoms. No unusual exp hx or h/o childhood pna/ asthma or knowledge of premature birth.  Sleeping ok without nocturnal  or early am exacerbation  of respiratory  c/o's or need for noct saba. Also denies any obvious fluctuation of symptoms with weather or  environmental changes or other aggravating or alleviating factors except as outlined above   Current Medications, Allergies, Complete Past Medical History, Past Surgical History, Family History, and Social History were reviewed in [employer] record.  ROS  The following are not active complaints unless bolded sore throat, dysphagia, dental problems, itching, sneezing,  nasal congestion or excess/ purulent secretions, ear ache,   fever, chills, sweats, unintended wt loss, classically pleuritic or exertional cp,  orthopnea pnd or leg swelling, presyncope, palpitations, abdominal pain, anorexia, nausea, vomiting, diarrhea  or change in bowel or bladder habits, change in stools or urine, dysuria,hematuria,  rash, arthralgias, visual complaints, headache, numbness, weakness or ataxia or problems with walking or coordination,  change in mood/affect or memory.                  Objective:  Physical Exam   amb wm nad  Chronically ill appearing     / vital signs reviewed   - Note on arrival 02 sats  100% on 2lpm     03/24/2014     210  >  03/21/2015  214 > 09/28/2015  > 01/03/2016   209  > 04/03/2016 200 >  07/03/2016  209  > 10/02/2016   200   HEENT mild turbinate edema.  Oropharynx no thrush or excess pnd or cobblestoning.  No JVD or cervical adenopathy. Mild accessory muscle hypertrophy. Trachea midline, nl thryroid. Chest was hyperinflated by percussion with very  diminished breath sounds bilaterally and moderate increased exp time without wheeze. Hoover sign positive at mid inspiration. Regular rate and rhythm without murmur gallop or rub ,  Trace  pitting edema =  bilateral lower ext  Abd: pot bellied contour, soft/ no hsm, nl excursion. Ext warm without cyanosis or clubbing.     CXR PA and Lateral:   10/02/2016 :    I personally reviewed images and agree with radiology impression as follows:    Advanced COPD with scarring in the bases  15 mm left lower lobe nodule  unchanged.          Assessment & Plan:

## 2016-10-03 NOTE — Assessment & Plan Note (Signed)
CT chest on 08/31/15 : 1.9 cm mass on the left abutting the visceral pleura and complete RML atx , high grade stenosis of proximal right mainstem bronchus w/ notable endobronchial mass. And a Exophytic 2.1cm mass distal body of stomach. No mediastinal or hilar adnenopathy noted.  >PET scan 08/19/5454  >hypermetabolic LLL nodule  @ 2.2 suv and neg otherwise   Chest x-rays reviewed with patient and his wife. There is been no change. He is not a candidate for surgery and would not even do well with radiation given the severity of his COPD.  Discussed in detail all the  indications, usual  risks and alternatives  relative to the benefits with patient who agrees to proceed with conservative f/u as outlined

## 2016-10-03 NOTE — Assessment & Plan Note (Signed)
-   03/24/2014   Walked 2 lpm nl pace but stopped x 2  And completed  one lap @ 185 stopped due to   Sob with adequate sats - 03/24/14 HCO3 = 33 c/w hypercarbia   rx as of 10/02/2016  = 2lpm 24/7 unless needs to titrate up walking with goal of > 90%

## 2016-10-03 NOTE — Assessment & Plan Note (Signed)
-   quit smoking 1990s  - PFTs 03/24/2014 FEV1  0.85 (27%) ratio 45 and dlco 26 corrects to 52%  - 03/24/14 trial of LAMA and d/c SAMA = tudorza 1 bid  - 08/11/2014  100% with dpi  - 08/11/2014 declined rehab  - re- instructed on flutter 01/03/2016   - 07/03/2016   try spiriva in place of tudorza which is not covered on plan > ? Muscle aches   - 10/02/2016  After extensive coaching HFA effectiveness =    90% with SMI > rechallenge with spriva respimat and if cannot tolerate will try to do a prior approval for Alexander Kirby as he is artery tried and failed incruse.  Really very little else to offer here as he is nearly endstage and if declines further need to consider a PO2 of care/hospice approach.  Each maintenance medication was reviewed in detail including most importantly the difference between maintenance and as needed and under what circumstances the prns are to be used.  Please see AVS for specific  Instructions which are unique to this visit and I personally typed out  which were reviewed in detail in writing with the patient and a copy provided.

## 2016-10-09 DIAGNOSIS — M109 Gout, unspecified: Secondary | ICD-10-CM | POA: Diagnosis not present

## 2016-10-09 DIAGNOSIS — E039 Hypothyroidism, unspecified: Secondary | ICD-10-CM | POA: Diagnosis not present

## 2016-10-11 DIAGNOSIS — Z6832 Body mass index (BMI) 32.0-32.9, adult: Secondary | ICD-10-CM | POA: Diagnosis not present

## 2016-10-11 DIAGNOSIS — I10 Essential (primary) hypertension: Secondary | ICD-10-CM | POA: Diagnosis not present

## 2016-10-11 DIAGNOSIS — I5030 Unspecified diastolic (congestive) heart failure: Secondary | ICD-10-CM | POA: Diagnosis not present

## 2016-10-11 DIAGNOSIS — E1165 Type 2 diabetes mellitus with hyperglycemia: Secondary | ICD-10-CM | POA: Diagnosis not present

## 2016-10-11 DIAGNOSIS — E7800 Pure hypercholesterolemia, unspecified: Secondary | ICD-10-CM | POA: Diagnosis not present

## 2016-10-11 DIAGNOSIS — Z5329 Procedure and treatment not carried out because of patient's decision for other reasons: Secondary | ICD-10-CM | POA: Diagnosis not present

## 2016-10-11 DIAGNOSIS — M109 Gout, unspecified: Secondary | ICD-10-CM | POA: Diagnosis not present

## 2016-10-11 DIAGNOSIS — N183 Chronic kidney disease, stage 3 (moderate): Secondary | ICD-10-CM | POA: Diagnosis not present

## 2016-10-11 DIAGNOSIS — E039 Hypothyroidism, unspecified: Secondary | ICD-10-CM | POA: Diagnosis not present

## 2016-10-11 DIAGNOSIS — J449 Chronic obstructive pulmonary disease, unspecified: Secondary | ICD-10-CM | POA: Diagnosis not present

## 2016-10-22 ENCOUNTER — Telehealth: Payer: Self-pay | Admitting: Internal Medicine

## 2016-10-22 NOTE — Telephone Encounter (Addendum)
Received PA for Tudoza  Covered alternatives are spiriva and incruse  Per MW- will need ov to switch to incruse, and ok to give samples of Tudorza to last until this appt   ATC, line busy [facility]

## 2016-10-23 NOTE — Telephone Encounter (Signed)
ATC, NA and no VM  

## 2016-10-26 NOTE — Telephone Encounter (Signed)
ATC 

## 2016-10-29 NOTE — Telephone Encounter (Signed)
ATC, NA and will close per protocol 

## 2016-12-24 ENCOUNTER — Telehealth: Payer: Self-pay | Admitting: Internal Medicine

## 2016-12-24 NOTE — Telephone Encounter (Signed)
Since it is after 5, will hold in triage for tomorrow.

## 2016-12-26 ENCOUNTER — Telehealth: Payer: Self-pay | Admitting: Internal Medicine

## 2016-12-26 NOTE — Telephone Encounter (Signed)
lmtcb x1 for pt's spouse, Jana Half [facility]) on work number  ATC home number, phone rang for >82min with no answer and no option to leave vm

## 2016-12-26 NOTE — Telephone Encounter (Signed)
Patient wife called back - she is needing a call back by 3pm - She can be reached at [phone]

## 2016-12-26 NOTE — Telephone Encounter (Signed)
LM for Mel at [place]

## 2016-12-26 NOTE — Telephone Encounter (Signed)
Mel with [facility] returning phone call. Mel stated she fax over four forms requesting prior auth. Mel stated they will accept a verbal auth by calling [phone].

## 2016-12-27 NOTE — Telephone Encounter (Signed)
Pt wife returning call- she can be reached at [phone] -pr

## 2016-12-27 NOTE — Telephone Encounter (Signed)
lmomtcb x 2 for the pts wife.   

## 2016-12-27 NOTE — Telephone Encounter (Signed)
Called [facility], spoke with PA representative [place] PA for Caprice Renshaw approved 7.12.18 through 7.12.19 Reference # [id number]  Transferred to the pharmacy dept to give new Rx so this can be shipped out to patient Spoke with pharmacist Audelia Hives - Rx given for #3 inhalers with 3 refills  Called spoke with patient's spouse Jana Half and informed her of the above.  She stated patient has enough of his inhaler to last until the shipment arrives  Patient is scheduled for follow up with MW on 7.17.18 and will keep this appt Nothing further needed; will sign off

## 2016-12-27 NOTE — Telephone Encounter (Signed)
Called spoke with patient's spouse Jana Half who is concerned that the PA for pt's Alexander Kirby has not yet been done.  Patient is nearly out of this medication.  Jana Half stated that she is willing to pay out of pocket for this if insurance denies the PA because it helps patient's breathing significantly.  Rolland Bimler that we have documentation that [facility] is indeed waiting on the form to be signed but that there is also notation that PA can be done over the phone and I will attempt this for her and call her back.  Per Jana Half, okay to leave a detailed message.  See 7.9.18 phone note.

## 2016-12-27 NOTE — Telephone Encounter (Signed)
Called [facility], spoke with PA representative [place] PA for Caprice Renshaw approved 7.12.18 through 7.12.19 Reference # [id number]  Transferred to the pharmacy dept to give new Rx so this can be shipped out to patient Spoke with pharmacist Audelia Hives - Rx given for #3 inhalers with 3 refills  Called spoke with patient's spouse Jana Half and informed her of the above.  She stated patient has enough of his inhaler to last until the shipment arrives  Patient is scheduled for follow up with MW on 7.17.18 and will keep this appt Nothing further needed; will sign off

## 2017-01-01 ENCOUNTER — Ambulatory Visit (INDEPENDENT_AMBULATORY_CARE_PROVIDER_SITE_OTHER): Payer: BLUE CROSS/BLUE SHIELD | Admitting: Internal Medicine

## 2017-01-01 ENCOUNTER — Encounter: Payer: Self-pay | Admitting: Internal Medicine

## 2017-01-01 VITALS — BP 152/72 | HR 85 | Ht 71.0 in | Wt 200.0 lb

## 2017-01-01 DIAGNOSIS — J449 Chronic obstructive pulmonary disease, unspecified: Secondary | ICD-10-CM

## 2017-01-01 DIAGNOSIS — R911 Solitary pulmonary nodule: Secondary | ICD-10-CM | POA: Diagnosis not present

## 2017-01-01 DIAGNOSIS — J9612 Chronic respiratory failure with hypercapnia: Secondary | ICD-10-CM | POA: Diagnosis not present

## 2017-01-01 MED ORDER — ACLIDINIUM BROMIDE 400 MCG/ACT IN AEPB: 1.0000 | INHALATION_SPRAY | Freq: Two times a day (BID) | RESPIRATORY_TRACT | 0 refills | Status: DC

## 2017-01-01 NOTE — Patient Instructions (Signed)
No change in medications   Please schedule a follow up visit in 4 months but call sooner if needed with cxr

## 2017-01-01 NOTE — Progress Notes (Signed)
Subjective:    Patient ID: Alexander Kirby, male    DOB: 1935/04/10,  MRN: [id number]    Brief patient profile:  81yowm quit smoking 1994 admit to [facility] 2014 but improved to point where did not need 02 as of March 2015 but persistent doe x 100 ft on  Level grade got worse April 2015 with sob and cough with production of of green mucus some better p steroid shot referred to pulmonary clinic 01/27/2014 by Dr Lisbeth Ply with documented GOLD IV COPD 03/24/14   History of Present Illness  01/27/2014 1st [facility] office visit/ Alexander Kirby  Chief Complaint  Patient presents with  . Pulmonary Consult    Referred by Dr. Daiva Eves. Pt states dxed with COPD approx 10 yrs ago. Pt c/o increased cough and SOB since April 2014. He gets SOB walking minimal distances.  Cough is prod with moderate green sputum.   usual routine > sleeps sitting  up on several pillows / sob when wakes but not typically prematurely or full of phlegm, then immediately use duoneb / uses symbicort 160 last but poor hfa  rec Plan A = automatic =symbicort 160 Take 2 puffs first thing in am and then another 2 puffs about 12 hours later.                                    ipatropium after symbicort and a total of 4 x daily  Plan B = backup  As needed up to every 4 hours, ok to use  neb alb at home or maxair  Prednisone 10 mg take  4 each am x 2 days,   2 each am x 2 days,  1 each am x 2 days and stop  Augmentin 875 mg take one pill twice daily  X 10 days  Work on inhaler technique:           09/09/2015 NP Follow up : COPD  Pt presents for abnormal CT chest  Recently seen by urology for hematuria found to have lung nodule on CT.  He was set up for a CT chest on 08/31/15 that showed 1.9 cm mass on the left abutting the visceral pleura and complete RML atx , high grade stenosis of proximal right mainstem bronchus w/ notable endobronchial mass. And a Exophytic 2.1cm mass distal body of stomach. No mediastinal or hilar adnenopathy  noted.  He was referred to our office to discuss results.   rec Continue Symbicort and Tudorza.  Set up for PET scan for lung nodule > 2.2 SUV        .    07/03/2016  f/u ov/Alexander Kirby re:   GOLD IV copd/ 2lpm 24/7 but  3lpm with acitivity symb/ tudorza  Chief Complaint  Patient presents with  . Follow-up    Pt stated he has SOB and coughes up yellow mucus. He is also following up for his lung nodule  didn't do as well on incruse so back to [place] but not in list  Some am coughing but min vol/ min discoloration  Doe still = MMRC3 even on 02  rec Stop tudorza and incruse and use spiriva 1.25 x 4 puffs each am until use it up then spiriva 2.5  X 2 puffs each am  Work on inhaler technique:  relax and gently blow all the way out then take a nice smooth deep breath back in, triggering the  inhaler at same time you start breathing in.  Hold for up to 5 seconds if you can. Blow out thru nose. Rinse and gargle with water when done Please schedule a follow up visit in 3 months but call sooner if needed with a cxr on return      10/02/2016  f/u ov/Alexander Kirby re:  GOLD IV copd/ tudorza and symbicort 160 2bid  Chief Complaint  Patient presents with  . Follow-up    Pt c/o increased SOB, but unsure for how long.  He is not coughing much, but occ produces some yellow sputum.    doe still MMRC 3 even on 2lpm /  Proair use may twice daily / neb maybe twice weekly  ? Reaction to spiriva (muscle aches) have not occurred on tudorza but never rechallenged with spiriva and insurance won't pay for [place] rec Stop tudorza  use spiriva 1.25 x 4 puffs each am until use it up then spiriva 2.5  X 2 puffs each am  Work on inhaler technique:      01/01/2017  f/u ov/Alexander Kirby re:  GOLD IV / copd / tudorza and symbicort  Chief Complaint  Patient presents with  . Follow-up    Pt states that his breathing is "great"- he is using his albuterol inhaler 2 x daily on average and neb 1-2 x per wk.    ? Somewhat achy on spiriva  and back on tudorza one twice daily and approved by insurance  02 2lpm 24/7  Sleeping ok   Avoids shopping / gets let off at door due to doe  [facility] = can't walk 100 yards even at a slow pace at a flat grade s stopping due to sob  Even on 2lpm not clear he's titrating up as instructed  / saba use as above/ reviewed   No obvious day to day or daytime variability or assoc excess/ purulent sputum or mucus plugs or hemoptysis or cp or chest tightness, subjective wheeze or overt sinus or hb symptoms. No unusual exp hx or h/o childhood pna/ asthma or knowledge of premature birth.  Sleeping ok without nocturnal  or early am exacerbation  of respiratory  c/o's or need for noct saba. Also denies any obvious fluctuation of symptoms with weather or environmental changes or other aggravating or alleviating factors except as outlined above   Current Medications, Allergies, Complete Past Medical History, Past Surgical History, Family History, and Social History were reviewed in [employer] record.  ROS  The following are not active complaints unless bolded sore throat, dysphagia, dental problems, itching, sneezing,  nasal congestion or excess/ purulent secretions, ear ache,   fever, chills, sweats, unintended wt loss, classically pleuritic or exertional cp,  orthopnea pnd or leg swelling, presyncope, palpitations, abdominal pain, anorexia, nausea, vomiting, diarrhea  or change in bowel or bladder habits, change in stools or urine, dysuria,hematuria,  rash, arthralgias, visual complaints, headache, numbness, weakness or ataxia or problems with walking or coordination,  change in mood/affect or memory.                                      Objective:  Physical Exam   amb wm nad  Chronically ill appearing     / vital signs reviewed   - Note on arrival 02 sats   93% on 2lpm     03/24/2014     210  >  03/21/2015  214 > 09/28/2015  > 01/03/2016   209  > 04/03/2016 200 >  07/03/2016   209  > 10/02/2016   200   HEENT mild turbinate edema.  Oropharynx no thrush or excess pnd or cobblestoning.  No JVD or cervical adenopathy. Mild accessory muscle hypertrophy. Trachea midline, nl thryroid. Chest was hyperinflated by percussion with very  diminished breath sounds bilaterally and moderate increased exp time without audible  wheeze. Hoover sign positive at mid inspiration. Regular rate and rhythm without murmur gallop or rub ,  Trace  pitting edema =  bilateral lower ext/wearing elastic hose   Abd: pot bellied contour, soft/ no hsm, nl excursion. Ext warm without cyanosis or clubbing.     CXR PA and Lateral:   10/02/2016 :    I personally reviewed images and agree with radiology impression as follows:    Advanced COPD with scarring in the bases  15 mm left lower lobe nodule unchanged.          Assessment & Plan:

## 2017-01-02 ENCOUNTER — Telehealth: Payer: Self-pay | Admitting: Internal Medicine

## 2017-01-02 NOTE — Assessment & Plan Note (Signed)
-   03/24/2014   Walked 2 lpm nl pace but stopped x 2  And completed  one lap @ 185 stopped due to   Sob with adequate sats - 03/24/14 HCO3 = 33 c/w hypercarbia   rx as of 01/01/2017  = 2lpm 24/7 unless needs to titrate up walking with goal of > 90%

## 2017-01-02 NOTE — Telephone Encounter (Signed)
MW  Spoke with the patient's wife and she states that this is correct that he had a reaction to the Spiriva and not the [place]. She states he has not had any problems taking this medication.  So it is ok to inform the pharmacy that you are aware and that the patient is ok to take medication?

## 2017-01-02 NOTE — Telephone Encounter (Signed)
He told me the reaction was to spiriva and not to [place] and he was taking it at the ov s adverse reaction (neck aching/tight)  - if this is not the case then d/c tudorza

## 2017-01-02 NOTE — Assessment & Plan Note (Signed)
CT chest on 08/31/15 : 1.9 cm mass on the left abutting the visceral pleura and complete RML atx , high grade stenosis of proximal right mainstem bronchus w/ notable endobronchial mass. And a Exophytic 2.1cm mass distal body of stomach. No mediastinal or hilar adnenopathy noted.  >PET scan 01/18/2918  >hypermetabolic LLL nodule  @ 2.2 suv and neg otherwise   Discussed in detail all the  indications, usual  risks and alternatives  relative to the benefits with patient who agrees to proceed with conservative f/u as outlined  > f/u with cxr in 4 m

## 2017-01-02 NOTE — Telephone Encounter (Signed)
Called pharmacy back and informed them of MW's message. They understood and made note of this and had no additional questions at this time. Nothing further is needed. Spoke with Vania Rea the pharmacist.

## 2017-01-02 NOTE — Telephone Encounter (Signed)
yes

## 2017-01-02 NOTE — Telephone Encounter (Signed)
MW  Please Advise-  Pt's pharmacy called and states they are getting an allergy alert for patient's turdoza. The allergy is patient is allergic to inhaled anticholinergics. Pharmacist Gracy Bruins states that this was reported July of 2018. Patient also called in regards to the same thing.

## 2017-01-02 NOTE — Assessment & Plan Note (Signed)
-   quit smoking 1990s  - PFTs 03/24/2014 FEV1  0.85 (27%) ratio 45 and dlco 26 corrects to 52%  - 03/24/14 trial of LAMA and d/c SAMA = tudorza 1 bid  - 08/11/2014  100% with dpi  - 08/11/2014 declined rehab  - re- instructed on flutter 01/03/2016   - 07/03/2016   try spiriva in place of tudorza which is not covered on plan > ? Muscle aches - 10/02/2016  After extensive coaching HFA effectiveness =    90% with SMI > rechallenge with spriva respimat but again had neck muscle aches resolved off it > approved for tudorza and better again    I had an extended discussion with the patient reviewing all relevant studies completed to date and  lasting 15 to 20 minutes of a 25 minute visit on the following ongoing concerns:  He has very severe dz but relatively well compensated on complex rx s recent aecopd  Formulary restrictions will be an ongoing challenge for the forseable future and I would be happy to pick an alternative if the pt will first  provide me a list of them but pt  will need to return here for training for any new device that is required eg dpi vs hfa vs respimat.    In meantime we can always provide samples so the patient never runs out of any needed respiratory medications.   Each maintenance medication was reviewed in detail including most importantly the difference between maintenance and as needed and under what circumstances the prns are to be used.  Please see AVS for specific  Instructions which are unique to this visit and I personally typed out  which were reviewed in detail in writing with the patient and a copy provided.

## 2017-01-02 NOTE — Telephone Encounter (Signed)
Pharm calling a/b pt medication possible interaction w/other med, please advise they can be reached @ [phone] ref # [id number].Hillery Hunter

## 2017-02-12 DIAGNOSIS — E039 Hypothyroidism, unspecified: Secondary | ICD-10-CM | POA: Diagnosis not present

## 2017-02-12 DIAGNOSIS — E1165 Type 2 diabetes mellitus with hyperglycemia: Secondary | ICD-10-CM | POA: Diagnosis not present

## 2017-02-12 DIAGNOSIS — E7800 Pure hypercholesterolemia, unspecified: Secondary | ICD-10-CM | POA: Diagnosis not present

## 2017-02-12 DIAGNOSIS — M109 Gout, unspecified: Secondary | ICD-10-CM | POA: Diagnosis not present

## 2017-02-19 DIAGNOSIS — J449 Chronic obstructive pulmonary disease, unspecified: Secondary | ICD-10-CM | POA: Diagnosis not present

## 2017-02-19 DIAGNOSIS — E7800 Pure hypercholesterolemia, unspecified: Secondary | ICD-10-CM | POA: Diagnosis not present

## 2017-02-19 DIAGNOSIS — E1165 Type 2 diabetes mellitus with hyperglycemia: Secondary | ICD-10-CM | POA: Diagnosis not present

## 2017-02-19 DIAGNOSIS — M109 Gout, unspecified: Secondary | ICD-10-CM | POA: Diagnosis not present

## 2017-02-19 DIAGNOSIS — I5030 Unspecified diastolic (congestive) heart failure: Secondary | ICD-10-CM | POA: Diagnosis not present

## 2017-02-19 DIAGNOSIS — I10 Essential (primary) hypertension: Secondary | ICD-10-CM | POA: Diagnosis not present

## 2017-02-19 DIAGNOSIS — E669 Obesity, unspecified: Secondary | ICD-10-CM | POA: Diagnosis not present

## 2017-02-19 DIAGNOSIS — Z6832 Body mass index (BMI) 32.0-32.9, adult: Secondary | ICD-10-CM | POA: Diagnosis not present

## 2017-02-19 DIAGNOSIS — E039 Hypothyroidism, unspecified: Secondary | ICD-10-CM | POA: Diagnosis not present

## 2017-02-19 DIAGNOSIS — N183 Chronic kidney disease, stage 3 (moderate): Secondary | ICD-10-CM | POA: Diagnosis not present

## 2017-04-22 DIAGNOSIS — N401 Enlarged prostate with lower urinary tract symptoms: Secondary | ICD-10-CM | POA: Diagnosis not present

## 2017-04-22 DIAGNOSIS — Z87442 Personal history of urinary calculi: Secondary | ICD-10-CM | POA: Diagnosis not present

## 2017-04-22 DIAGNOSIS — R351 Nocturia: Secondary | ICD-10-CM | POA: Diagnosis not present

## 2017-04-22 DIAGNOSIS — R310 Gross hematuria: Secondary | ICD-10-CM | POA: Diagnosis not present

## 2017-04-23 ENCOUNTER — Ambulatory Visit: Payer: BLUE CROSS/BLUE SHIELD | Admitting: Internal Medicine

## 2017-04-29 ENCOUNTER — Ambulatory Visit (INDEPENDENT_AMBULATORY_CARE_PROVIDER_SITE_OTHER): Payer: Medicare Other | Admitting: Internal Medicine

## 2017-04-29 ENCOUNTER — Encounter: Payer: Self-pay | Admitting: Internal Medicine

## 2017-04-29 ENCOUNTER — Ambulatory Visit (INDEPENDENT_AMBULATORY_CARE_PROVIDER_SITE_OTHER)
Admission: RE | Admit: 2017-04-29 | Discharge: 2017-04-29 | Disposition: A | Discharge status: Home / Self Care | Payer: Medicare Other | Source: Ambulatory Visit | Attending: Internal Medicine | Admitting: Internal Medicine

## 2017-04-29 ENCOUNTER — Other Ambulatory Visit: Payer: Self-pay | Admitting: Internal Medicine

## 2017-04-29 VITALS — BP 122/78 | HR 85 | Ht 71.0 in | Wt 212.0 lb

## 2017-04-29 DIAGNOSIS — R911 Solitary pulmonary nodule: Secondary | ICD-10-CM

## 2017-04-29 DIAGNOSIS — J9612 Chronic respiratory failure with hypercapnia: Secondary | ICD-10-CM

## 2017-04-29 DIAGNOSIS — Z23 Encounter for immunization: Secondary | ICD-10-CM

## 2017-04-29 DIAGNOSIS — J449 Chronic obstructive pulmonary disease, unspecified: Secondary | ICD-10-CM

## 2017-04-29 NOTE — Patient Instructions (Signed)

## 2017-04-29 NOTE — Progress Notes (Signed)
Subjective:    Patient ID: Alexander Kirby, male    DOB: 02-04-1935,  MRN: [id number]    Brief patient profile:  19  yowm quit smoking 1994 admit to [facility] 2014 but improved to point where did not need 02 as of March 2015 but persistent doe x 100 ft on  Level grade got worse April 2015 with sob and cough with production of of green mucus some better p steroid shot referred to pulmonary clinic 01/27/2014 by Dr Lisbeth Ply with documented GOLD IV COPD 03/24/14     History of Present Illness  01/27/2014 1st [facility] office visit/ Alexander Kirby  Chief Complaint  Patient presents with  . Pulmonary Consult    Referred by Dr. Daiva Eves. Pt states dxed with COPD approx 10 yrs ago. Pt c/o increased cough and SOB since April 2014. He gets SOB walking minimal distances.  Cough is prod with moderate green sputum.   usual routine > sleeps sitting  up on several pillows / sob when wakes but not typically prematurely or full of phlegm, then immediately use duoneb / uses symbicort 160 last but poor hfa  rec Plan A = automatic =symbicort 160 Take 2 puffs first thing in am and then another 2 puffs about 12 hours later.                                    ipatropium after symbicort and a total of 4 x daily  Plan B = backup  As needed up to every 4 hours, ok to use  neb alb at home or maxair  Prednisone 10 mg take  4 each am x 2 days,   2 each am x 2 days,  1 each am x 2 days and stop  Augmentin 875 mg take one pill twice daily  X 10 days  Work on inhaler technique:           09/09/2015 NP Follow up : COPD  Pt presents for abnormal CT chest  Recently seen by urology for hematuria found to have lung nodule on CT.  He was set up for a CT chest on 08/31/15 that showed 1.9 cm mass on the left abutting the visceral pleura and complete RML atx , high grade stenosis of proximal right mainstem bronchus w/ notable endobronchial mass. And a Exophytic 2.1cm mass distal body of stomach. No mediastinal or hilar adnenopathy  noted.  He was referred to our office to discuss results.   rec Continue Symbicort and Tudorza.  Set up for PET scan for lung nodule > 2.2 SUV         01/01/2017  f/u ov/Alexander Kirby re:  GOLD IV / copd / tudorza and symbicort  Chief Complaint  Patient presents with  . Follow-up    Pt states that his breathing is "great"- he is using his albuterol inhaler 2 x daily on average and neb 1-2 x per wk.    ? Somewhat achy on spiriva and back on tudorza one twice daily and approved by insurance  02 2lpm 24/7  Sleeping ok   Avoids shopping / gets let off at door due to doe  [facility] = can't walk 100 yards even at a slow pace at a flat grade s stopping due to sob  Even on 2lpm not clear he's titrating up as instructed  / saba use as above/ reviewed  rec No change in medications  Please schedule a follow up visit in 4 months but call sooner if needed with cxr    04/29/2017  f/u ov/Alexander Kirby re:   GOLD IV copd/ LLL SPN Chief Complaint  Patient presents with  . Follow-up    Breathing is unchanged. He denies any new co's today.    congested cough esp in am > sev tbs mucoid sputum/ rare need for saba but very sedentary   doe = MMRC3 = can't walk 100 yards even at a slow pace at a flat grade s stopping due to sob    No obvious day to day or daytime variability or assoc  purulent sputum or mucus plugs or hemoptysis or cp or chest tightness, subjective wheeze or overt sinus or hb symptoms. No unusual exp hx or h/o childhood pna/ asthma or knowledge of premature birth.  Sleeping ok flat without nocturnal  or early am exacerbation  of respiratory  c/o's or need for noct saba. Also denies any obvious fluctuation of symptoms with weather or environmental changes or other aggravating or alleviating factors except as outlined above   Current Allergies, Complete Past Medical History, Past Surgical History, Family History, and Social History were reviewed in [employer] record.  ROS  The  following are not active complaints unless bolded Hoarseness, sore throat, dysphagia, dental problems, itching, sneezing,  nasal congestion or discharge of excess mucus or purulent secretions, ear ache,   fever, chills, sweats, unintended wt loss or wt gain, classically pleuritic or exertional cp,  orthopnea pnd or leg swelling, presyncope, palpitations, abdominal pain, anorexia, nausea, vomiting, diarrhea  or change in bowel habits or change in bladder habits, change in stools or change in urine, dysuria, hematuria,  rash, arthralgias, visual complaints, headache, numbness, weakness or ataxia or problems with walking or coordination,  change in mood/affect or memory.        Current Meds  Medication Sig  . Aclidinium Bromide (TUDORZA PRESSAIR) 400 MCG/ACT AEPB Inhale 1 puff into the lungs 2 (two) times daily.  Marland Kitchen albuterol (PROAIR HFA) 108 (90 Base) MCG/ACT inhaler Inhale 2 puffs into the lungs every 6 (six) hours as needed for wheezing or shortness of breath.  Marland Kitchen albuterol (PROVENTIL) (2.5 MG/3ML) 0.083% nebulizer solution Take 2.5 mg by nebulization every 4 (four) hours as needed. With atrovent  . budesonide-formoterol (SYMBICORT) 160-4.5 MCG/ACT inhaler Inhale 2 puffs into the lungs 2 (two) times daily.  . colestipol (COLESTID) 1 G tablet Take 1 g by mouth daily.  . finasteride (PROSCAR) 5 MG tablet Take 5 mg by mouth daily.  . furosemide (LASIX) 80 MG tablet Take 80 mg by mouth daily.  Marland Kitchen guaiFENesin (MUCINEX) 600 MG 12 hr tablet Take 2 tablets (1,200 mg total) by mouth 2 (two) times daily. (Patient taking differently: Take 600 mg by mouth 2 (two) times daily as needed for cough or to loosen phlegm. )  . insulin glargine (LANTUS) 100 UNIT/ML injection Inject 15 Units into the skin at bedtime. (Patient taking differently: Inject 25 Units into the skin 2 (two) times daily. )  . levothyroxine (SYNTHROID, LEVOTHROID) 75 MCG tablet Take 75 mcg by mouth daily before breakfast.  . loratadine (CLARITIN) 10  MG tablet Take 10 mg by mouth daily as needed.   Marland Kitchen NIFEdipine (PROCARDIA XL/ADALAT-CC) 60 MG 24 hr tablet Take 60 mg by mouth every morning.  . OXYGEN Oxygen 2lpm 24/7  . Potassium Chloride 40 MEQ/15ML (20%) SOLN Take 15 mLs by mouth daily.  . Probiotic Product (ALIGN)  4 MG CAPS Take 1 capsule by mouth daily.  . Red Yeast Rice Extract (RED YEAST RICE PO) Take 1 tablet by mouth 3 (three) times daily.   Marland Kitchen Respiratory Therapy Supplies (FLUTTER) DEVI Use as directed  . rivaroxaban (XARELTO) 20 MG TABS tablet Take 20 mg by mouth daily with supper.  . Tamsulosin HCl (FLOMAX) 0.4 MG CAPS Take 0.4 mg by mouth at bedtime.                                         Objective:  Physical Exam   amb wm nad  Chronically ill appearing     / vital signs reviewed   - Note on arrival 02 sats   97% on 2.5lpm     03/24/2014     210  >  03/21/2015  214 > 09/28/2015  > 01/03/2016   209  > 04/03/2016 200 >  07/03/2016  209  > 10/02/2016   200 >  04/29/2017   212   HEENT mild turbinate edema.  Oropharynx no thrush or excess pnd or cobblestoning.  No JVD or cervical adenopathy. Mild accessory muscle hypertrophy. Trachea midline, nl thryroid. Chest was hyperinflated by percussion with very  diminished breath sounds bilaterally and moderate increased exp time without audible  wheeze. Hoover sign positive at mid inspiration. Regular rate and rhythm without murmur gallop or rub ,  Trace  pitting edema =  bilateral lower ext/wearing elastic hose   Abd: pot bellied contour, soft/ no hsm, nl excursion. Ext warm without cyanosis or clubbing.      HEENT: nl dentition, turbinates bilaterally, and oropharynx. Nl external ear canals without cough reflex   NECK :  without JVD/Nodes/TM/ nl carotid upstrokes bilaterally   LUNGS: no acc muscle use,   Barrel chest with very distant bs s wheeze    CV:  RRR  no s3 or murmur or increase in P2, and   Trace to 1+ bilateral lower ext pitting edema  ABD:  soft and  nontender with nl inspiratory excursion in the supine position. No bruits or organomegaly appreciated, bowel sounds nl  MS:  Nl gait/ ext warm without deformities, calf tenderness, cyanosis or clubbing No obvious joint restrictions   SKIN: warm and dry without lesions    NEURO:  alert, approp, nl sensorium with  no motor or cerebellar deficits apparent.     CXR PA and Lateral:   04/29/2017 :    I personally reviewed images and agree with radiology impression as follows:    1. Stable left lower lobe lung nodule. 2. COPD          Assessment & Plan:

## 2017-05-01 ENCOUNTER — Encounter: Payer: Self-pay | Admitting: Internal Medicine

## 2017-05-01 NOTE — Assessment & Plan Note (Signed)
-   03/24/2014   Walked 2 lpm nl pace but stopped x 2  And completed  one lap @ 185 stopped due to   Sob with adequate sats - 03/24/14 HCO3 = 33 c/w hypercarbia   rx as of 04/29/2017  = 2-2.5 lpm 24/7 unless needs to titrate up walking with goal of > 90%  Though somewhat paradoxic, when the lung fails to clear C02 properly and pC02 rises the lung then becomes a more efficient scavenger of C02 allowing lower work of breathing and  better C02 clearance albeit at a higher serum pC02 level - this is why pts can look a lot better than their ABG's would suggest and why it's so difficult to prognosticate endstage dz.  It's also why I strongly rec DNI status (ventilating pts down to a nl pC02 adversely affects this compensatory mechanism)

## 2017-05-01 NOTE — Assessment & Plan Note (Signed)
CT chest on 08/31/15 : 1.9 cm mass on the left abutting the visceral pleura and complete RML atx , high grade stenosis of proximal right mainstem bronchus w/ notable endobronchial mass. And a Exophytic 2.1cm mass distal body of stomach. No mediastinal or hilar adnenopathy noted.  >PET scan 6/0/1093  >hypermetabolic LLL nodule  @ 2.2 suv and neg otherwise    I had an extended discussion with the patient reviewing all relevant studies completed to date and  lasting 15 to 20 minutes of a 25 minute visit on the following ongoing concerns:   This probably represents a low grade bronchogenic ca but can't be sure about prognosticating s bx  It could probably be reached by ct directed bx but then the question becomes what to do about it as clearly can't afford to lose lung function to surgery or much beyond palliative RT which he doesn't need for now anyway  Wants to think about it and review options at next ov after the holidays which is fine with me   Each maintenance medication was reviewed in detail including most importantly the difference between maintenance and as needed and under what circumstances the prns are to be used.  Please see AVS for specific  Instructions which are unique to this visit and I personally typed out  which were reviewed in detail in writing with the patient and a copy provided.

## 2017-05-01 NOTE — Assessment & Plan Note (Signed)
-   quit smoking 1990s  - PFTs 03/24/2014 FEV1  0.85 (27%) ratio 45 and dlco 26 corrects to 52%  - 03/24/14 trial of LAMA and d/c SAMA = tudorza 1 bid  - 08/11/2014  100% with dpi  - 08/11/2014 declined rehab  - re- instructed on flutter 01/03/2016   - 07/03/2016   try spiriva in place of tudorza which is not covered on plan > ? Muscle aches - 10/02/2016  After extensive coaching HFA effectiveness =    90% with SMI > rechallenge with spriva respimat but again had neck muscle aches resolved off it > approved for tudorza and better again    He is relatively well compensated but clearly has very severe copd/ no change in rx feasible

## 2017-05-08 DIAGNOSIS — Z85820 Personal history of malignant melanoma of skin: Secondary | ICD-10-CM | POA: Diagnosis not present

## 2017-05-08 DIAGNOSIS — D692 Other nonthrombocytopenic purpura: Secondary | ICD-10-CM | POA: Diagnosis not present

## 2017-05-08 DIAGNOSIS — D1801 Hemangioma of skin and subcutaneous tissue: Secondary | ICD-10-CM | POA: Diagnosis not present

## 2017-05-08 DIAGNOSIS — Z85828 Personal history of other malignant neoplasm of skin: Secondary | ICD-10-CM | POA: Diagnosis not present

## 2017-05-08 DIAGNOSIS — L821 Other seborrheic keratosis: Secondary | ICD-10-CM | POA: Diagnosis not present

## 2017-05-08 DIAGNOSIS — L720 Epidermal cyst: Secondary | ICD-10-CM | POA: Diagnosis not present

## 2017-05-08 DIAGNOSIS — L814 Other melanin hyperpigmentation: Secondary | ICD-10-CM | POA: Diagnosis not present

## 2017-06-03 DIAGNOSIS — Z794 Long term (current) use of insulin: Secondary | ICD-10-CM | POA: Diagnosis not present

## 2017-06-03 DIAGNOSIS — Z961 Presence of intraocular lens: Secondary | ICD-10-CM | POA: Diagnosis not present

## 2017-06-03 DIAGNOSIS — E119 Type 2 diabetes mellitus without complications: Secondary | ICD-10-CM | POA: Diagnosis not present

## 2017-06-05 ENCOUNTER — Other Ambulatory Visit: Payer: Self-pay | Admitting: Internal Medicine

## 2017-06-05 ENCOUNTER — Telehealth: Payer: Self-pay | Admitting: Internal Medicine

## 2017-06-05 NOTE — Telephone Encounter (Signed)
Attempted to contact pt. No answer, no option to leave a message. Will try back.  

## 2017-06-05 NOTE — Telephone Encounter (Signed)
Patient's wife will not be able to be reached after 1:00 pm today.

## 2017-06-06 MED ORDER — ACLIDINIUM BROMIDE 400 MCG/ACT IN AEPB: 1.0000 | INHALATION_SPRAY | Freq: Two times a day (BID) | RESPIRATORY_TRACT | 0 refills | Status: DC

## 2017-06-06 NOTE — Telephone Encounter (Signed)
Pt's spouse requesting a 90 day supply rx for tudorza.  This has been sent to requested pharmacy.  Nothing further needed.

## 2017-07-30 ENCOUNTER — Ambulatory Visit (INDEPENDENT_AMBULATORY_CARE_PROVIDER_SITE_OTHER): Payer: Medicare Other | Admitting: Internal Medicine

## 2017-07-30 ENCOUNTER — Telehealth: Payer: Self-pay | Admitting: Internal Medicine

## 2017-07-30 ENCOUNTER — Encounter: Payer: Self-pay | Admitting: Internal Medicine

## 2017-07-30 ENCOUNTER — Ambulatory Visit (INDEPENDENT_AMBULATORY_CARE_PROVIDER_SITE_OTHER)
Admission: RE | Admit: 2017-07-30 | Discharge: 2017-07-30 | Disposition: A | Discharge status: Home / Self Care | Payer: Medicare Other | Source: Ambulatory Visit | Attending: Internal Medicine | Admitting: Internal Medicine

## 2017-07-30 VITALS — BP 134/80 | HR 91 | Ht 71.0 in | Wt 211.0 lb

## 2017-07-30 DIAGNOSIS — J449 Chronic obstructive pulmonary disease, unspecified: Secondary | ICD-10-CM

## 2017-07-30 DIAGNOSIS — J9612 Chronic respiratory failure with hypercapnia: Secondary | ICD-10-CM | POA: Diagnosis not present

## 2017-07-30 DIAGNOSIS — R911 Solitary pulmonary nodule: Secondary | ICD-10-CM | POA: Diagnosis not present

## 2017-07-30 NOTE — Telephone Encounter (Signed)
Called and spoke with patients wife, she wants to know if he needs to go downstairs for cxr before OV today.   MW please advise, thanks!

## 2017-07-30 NOTE — Telephone Encounter (Signed)
[place] for cxr for copd

## 2017-07-30 NOTE — Progress Notes (Signed)
Subjective:    Patient ID: Alexander Kirby, male    DOB: 01-Oct-1934,  MRN: [id number]    Brief patient profile:  89  yowm quit smoking 1994 admit to [facility] 2014 but improved to point where did not need 02 as of March 2015 but persistent doe x 100 ft on  Level grade got worse April 2015 with sob and cough with production of of green mucus some better p steroid shot referred to pulmonary clinic 01/27/2014 by Dr Lisbeth Ply with documented GOLD IV COPD 03/24/14     History of Present Illness  01/27/2014 1st [facility] office visit/ [facility]  Chief Complaint  Patient presents with  . Pulmonary Consult    Referred by Dr. Daiva Eves. Pt states dxed with COPD approx 10 yrs ago. Pt c/o increased cough and SOB since April 2014. He gets SOB walking minimal distances.  Cough is prod with moderate green sputum.   usual routine > sleeps sitting  up on several pillows / sob when wakes but not typically prematurely or full of phlegm, then immediately use duoneb / uses symbicort 160 last but poor hfa  rec Plan A = automatic =symbicort 160 Take 2 puffs first thing in am and then another 2 puffs about 12 hours later.                                    ipatropium after symbicort and a total of 4 x daily  Plan B = backup  As needed up to every 4 hours, ok to use  neb alb at home or maxair  Prednisone 10 mg take  4 each am x 2 days,   2 each am x 2 days,  1 each am x 2 days and stop  Augmentin 875 mg take one pill twice daily  X 10 days  Work on inhaler technique:           09/09/2015 NP Follow up : COPD  Pt presents for abnormal CT chest  Recently seen by urology for hematuria found to have lung nodule on CT.   rec Continue Symbicort and Tudorza.  Set up for PET scan for lung nodule =  2.2 SUV     07/30/2017  f/u ov/[facility] re:  GOLD IV/ 02 dep/ LLL spn  Chief Complaint  Patient presents with  . Follow-up    Breathing is unchanged.  He is using his proair 2 x daily on average and neb 3 x per wk.    Dyspnea:  On 02 up to 3lpm doe = MMRC3 = can't walk 100 yards even at a slow pace at a flat grade s stopping due to sob   Cough: some am congestion> mucoid/beige/ min qod Sleep: on 2lpm 2 pillows   No obvious day to day or daytime variability or assoc excess/ purulent sputum or mucus plugs or hemoptysis or cp or chest tightness, subjective wheeze or overt sinus or hb symptoms. No unusual exposure hx or h/o childhood pna/ asthma or knowledge of premature birth.  Sleeping ok 2pillows/ 2lpm  without nocturnal    exacerbation  of respiratory  c/o's or need for noct saba. Also denies any obvious fluctuation of symptoms with weather or environmental changes or other aggravating or alleviating factors except as outlined above   Current Allergies, Complete Past Medical History, Past Surgical History, Family History, and Social History were reviewed in [employer]  medical record.  ROS  The following are not active complaints unless bolded Hoarseness, sore throat, dysphagia, dental problems, itching, sneezing,  nasal congestion or discharge of excess mucus or purulent secretions, ear ache,   fever, chills, sweats, unintended wt loss or wt gain, classically pleuritic or exertional cp,  orthopnea pnd or leg swelling, presyncope, palpitations, abdominal pain, anorexia, nausea, vomiting, diarrhea  or change in bowel habits or change in bladder habits, change in stools or change in urine, dysuria, hematuria,  rash, arthralgias, visual complaints, headache, numbness, weakness or ataxia or problems with walking or coordination using cane,  change in mood/affect or memory.        Current Meds  Medication Sig  . Aclidinium Bromide (TUDORZA PRESSAIR) 400 MCG/ACT AEPB Inhale 1 puff into the lungs 2 (two) times daily.  Marland Kitchen albuterol (PROVENTIL) (2.5 MG/3ML) 0.083% nebulizer solution Take 2.5 mg by nebulization every 4 (four) hours as needed. With atrovent  . budesonide-formoterol (SYMBICORT) 160-4.5 MCG/ACT  inhaler Inhale 2 puffs into the lungs 2 (two) times daily.  . colestipol (COLESTID) 1 G tablet Take 1 g by mouth daily.  . finasteride (PROSCAR) 5 MG tablet Take 5 mg by mouth daily.  . furosemide (LASIX) 80 MG tablet Take 80 mg by mouth daily.  Marland Kitchen guaiFENesin (MUCINEX) 600 MG 12 hr tablet Take 2 tablets (1,200 mg total) by mouth 2 (two) times daily. (Patient taking differently: Take 600 mg by mouth 2 (two) times daily as needed for cough or to loosen phlegm. )  . insulin glargine (LANTUS) 100 UNIT/ML injection Inject 15 Units into the skin at bedtime. (Patient taking differently: Inject 25 Units into the skin 2 (two) times daily. )  . levothyroxine (SYNTHROID, LEVOTHROID) 75 MCG tablet Take 75 mcg by mouth daily before breakfast.  . loratadine (CLARITIN) 10 MG tablet Take 10 mg by mouth daily as needed.   Marland Kitchen NIFEdipine (PROCARDIA XL/ADALAT-CC) 60 MG 24 hr tablet Take 60 mg by mouth every morning.  . OXYGEN Oxygen 2lpm 24/7  . Potassium Chloride 40 MEQ/15ML (20%) SOLN Take 15 mLs by mouth daily.  Marland Kitchen PROAIR HFA 108 (90 Base) MCG/ACT inhaler FOR DIRECTIONS ON HOW TO   TAKE THIS MEDICINE, READ   THE ENCLOSED MEDICATION    INFORMATION FORM  . Probiotic Product (ALIGN) 4 MG CAPS Take 1 capsule by mouth daily.  . Red Yeast Rice Extract (RED YEAST RICE PO) Take 1 tablet by mouth 3 (three) times daily.   Marland Kitchen Respiratory Therapy Supplies (FLUTTER) DEVI Use as directed  . rivaroxaban (XARELTO) 20 MG TABS tablet Take 20 mg by mouth daily with supper.  . Tamsulosin HCl (FLOMAX) 0.4 MG CAPS Take 0.4 mg by mouth at bedtime.                   Objective:  Physical Exam    chronically ill amb wm nad  Vital signs reviewed - Note on arrival 02 sats  91 % on 2lpm    2lpm   03/24/2014     210  >  03/21/2015  214 > 09/28/2015  > 01/03/2016   209  > 04/03/2016 200 >  07/03/2016  209  > 10/02/2016   200 >  04/29/2017   212 > 07/30/2017  211     HEENT: nl   turbinates bilaterally, and oropharynx. Nl external ear  canals without cough reflex - top dentures / bottom partial    NECK :  without JVD/Nodes/TM/ nl carotid upstrokes bilaterally  LUNGS: no acc muscle use,   barrel contour chest wall with bilateral  Distant bs without cough on insp or exp maneuver and hyper resonant   to  percussion bilaterally    CV:  RRR  no s3 or murmur or increase in P2, and  Trace sym pedal  edema   ABD:  soft and nontender with pos early hoover's  in the supine position. No bruits or organomegaly appreciated, bowel sounds nl  MS:  Nl gait/ ext warm without deformities, calf tenderness, cyanosis or clubbing No obvious joint restrictions   SKIN: warm and dry without lesions    NEURO:  alert, approp, nl sensorium with  no motor or cerebellar deficits apparent.        CXR PA and Lateral:   07/30/2017 :    I personally reviewed images and agree with radiology impression as follows:    COPD. Stable left lower lobe nodule. Stable interstitial prominence in the mid and lower lungs bilaterally.         Assessment & Plan:

## 2017-07-30 NOTE — Assessment & Plan Note (Signed)
-   quit smoking 1990s  - PFTs 03/24/2014 FEV1  0.85 (27%) ratio 45 and dlco 26 corrects to 52%  - 03/24/14 trial of LAMA and d/c SAMA = tudorza 1 bid  - 08/11/2014  100% with dpi  - 08/11/2014 declined rehab  - re- instructed on flutter 01/03/2016   - 07/03/2016   try spiriva in place of tudorza which is not covered on plan > ? Muscle aches - 10/02/2016  After extensive coaching HFA effectiveness =    90% with SMI > rechallenge with spriva respimat but again had neck muscle aches resolved off it > approved for tudorza and better again     Group D in terms of symptom/risk and laba/lama/ICS  therefore appropriate rx at this point > continue symb/turdoza  Nothing else to add for now

## 2017-07-30 NOTE — Patient Instructions (Signed)
See calendar for specific medication instructions and bring it back for each and every office visit for every healthcare provider you see.  Without it,  you may not receive the best quality medical care that we feel you deserve.  You will note that the calendar groups together  your maintenance  medications that are timed at particular times of the day.  Think of this as your checklist for what your doctor has instructed you to do until your next evaluation to see what benefit  there is  to staying on a consistent group of medications intended to keep you well.  The other group at the bottom is entirely up to you to use as you see fit  for specific symptoms that may arise between visits that require you to treat them on an as needed basis.  Think of this as your action plan or "what if" list.   Separating the top medications from the bottom group is fundamental to providing you adequate care going forward.    Please schedule a follow up visit in 35months but call sooner if needed with cxr on return

## 2017-07-30 NOTE — Telephone Encounter (Signed)
Spoke with patients wife, advised of MW response.

## 2017-07-30 NOTE — Assessment & Plan Note (Addendum)
CT chest on 08/31/15 : 1.9 cm mass on the left abutting the visceral pleura and complete RML atx , high grade stenosis of proximal right mainstem bronchus w/ notable endobronchial mass. And a Exophytic 2.1cm mass distal body of stomach. No mediastinal or hilar adnenopathy noted.  >PET scan 06/19/1973  >hypermetabolic LLL nodule  @ 2.2 suv and neg otherwise   No def growth or clinical impact at this point and only access for tissue dx as suggested by PET is the LLL which would be risky given the severity of his copd, which is near endstage  Discussed in detail all the  indications, usual  risks and alternatives  relative to the benefits with patient who agrees to proceed with conservative f/u with the attitude consider treatment when has symptoms attributable to the PET findings and not sooner  I had an extended discussion with the patient and wife  reviewing all relevant studies completed to date and  lasting 15 to 20 minutes of a 25 minute visit      Each maintenance medication was reviewed in detail including most importantly the difference between maintenance and as needed and under what circumstances the prns are to be used. This was done in the context of a medication calendar review which provided the patient with a user-friendly unambiguous mechanism for medication administration and reconciliation and provides an action plan for all active problems. It is critical that this be shown to every doctor  for modification during the office visit if necessary so the patient can use it as a working document.

## 2017-07-30 NOTE — Assessment & Plan Note (Signed)
-   03/24/2014   Walked 2 lpm nl pace but stopped x 2  And completed  one lap @ 185 stopped due to   Sob with adequate sats - 03/24/14 HCO3 = 33 c/w hypercarbia   rx as of 07/30/2017  = 2-2.5 lpm 24/7 unless needs to titrate up walking with goal of > 90%

## 2017-09-17 ENCOUNTER — Telehealth: Payer: Self-pay | Admitting: Internal Medicine

## 2017-09-17 MED ORDER — ACLIDINIUM BROMIDE 400 MCG/ACT IN AEPB: 1.0000 | INHALATION_SPRAY | Freq: Two times a day (BID) | RESPIRATORY_TRACT | 3 refills | Status: DC

## 2017-09-17 NOTE — Telephone Encounter (Signed)
Spoke with pt's wife. Pt is needing a refill on Tudorza. Rx has been sent in. Nothing further was needed.

## 2017-12-24 ENCOUNTER — Telehealth: Payer: Self-pay | Admitting: Internal Medicine

## 2017-12-24 MED ORDER — BUDESONIDE-FORMOTEROL FUMARATE 160-4.5 MCG/ACT IN AERO: 2.0000 | INHALATION_SPRAY | Freq: Two times a day (BID) | RESPIRATORY_TRACT | 3 refills | Status: DC

## 2017-12-24 NOTE — Telephone Encounter (Signed)
Spoke with pt's wife, requesting a refill on Symbicort. This has been sent to preferred pharmacy.  Nothing further needed.

## 2018-01-28 ENCOUNTER — Ambulatory Visit (INDEPENDENT_AMBULATORY_CARE_PROVIDER_SITE_OTHER)
Admission: RE | Admit: 2018-01-28 | Discharge: 2018-01-28 | Disposition: A | Discharge status: Home / Self Care | Payer: Medicare Other | Source: Ambulatory Visit | Attending: Internal Medicine | Admitting: Internal Medicine

## 2018-01-28 ENCOUNTER — Encounter: Payer: Self-pay | Admitting: Internal Medicine

## 2018-01-28 ENCOUNTER — Ambulatory Visit (INDEPENDENT_AMBULATORY_CARE_PROVIDER_SITE_OTHER): Payer: Medicare Other | Admitting: Internal Medicine

## 2018-01-28 ENCOUNTER — Telehealth: Payer: Self-pay | Admitting: Internal Medicine

## 2018-01-28 VITALS — BP 138/60 | HR 79 | Ht 71.0 in | Wt 206.0 lb

## 2018-01-28 DIAGNOSIS — J449 Chronic obstructive pulmonary disease, unspecified: Secondary | ICD-10-CM

## 2018-01-28 DIAGNOSIS — R911 Solitary pulmonary nodule: Secondary | ICD-10-CM

## 2018-01-28 DIAGNOSIS — J9612 Chronic respiratory failure with hypercapnia: Secondary | ICD-10-CM

## 2018-01-28 NOTE — Progress Notes (Signed)
Subjective:    Patient ID: Alexander Kirby, male    DOB: 09-27-1934,  MRN: [id number]    Brief patient profile:  75  yowm quit smoking 1994 admit to [facility] 2014 but improved to point where did not need 02 as of March 2015 but persistent doe x 100 ft on  Level grade got worse April 2015 with sob and cough with production of of green mucus some better p steroid shot referred to pulmonary clinic 01/27/2014 by Dr Lisbeth Ply with documented GOLD IV COPD 03/24/14     History of Present Illness  01/27/2014 1st [facility] office visit/ Alexander Kirby  Chief Complaint  Patient presents with  . Pulmonary Consult    Referred by Dr. Daiva Eves. Pt states dxed with COPD approx 10 yrs ago. Pt c/o increased cough and SOB since April 2014. He gets SOB walking minimal distances.  Cough is prod with moderate green sputum.   usual routine > sleeps sitting  up on several pillows / sob when wakes but not typically prematurely or full of phlegm, then immediately use duoneb / uses symbicort 160 last but poor hfa  rec Plan A = automatic =symbicort 160 Take 2 puffs first thing in am and then another 2 puffs about 12 hours later.                                    ipatropium after symbicort and a total of 4 x daily  Plan B = backup  As needed up to every 4 hours, ok to use  neb alb at home or maxair  Prednisone 10 mg take  4 each am x 2 days,   2 each am x 2 days,  1 each am x 2 days and stop  Augmentin 875 mg take one pill twice daily  X 10 days  Work on inhaler technique:           09/09/2015 NP Follow up : COPD  Pt presents for abnormal CT chest  Recently seen by urology for hematuria found to have lung nodule on CT.   rec Continue Symbicort and Tudorza.  Set up for PET scan for lung nodule =  2.2 SUV     07/30/2017  f/u ov/Alexander Kirby re:  GOLD IV/ 02 dep/ LLL spn  Chief Complaint  Patient presents with  . Follow-up    Breathing is unchanged.  He is using his proair 2 x daily on average and neb 3 x per wk.     Dyspnea:  On 02 up to 3lpm doe = MMRC3 = can't walk 100 yards even at a slow pace at a flat grade s stopping due to sob   Cough: some am congestion> mucoid/beige/ min qod Sleep: on 2lpm 2 pillows  rec See calendar for specific medication instructions and bring it back for each and every office visit for every healthcare provider you see.  Without it,  you may not receive the best quality medical care that we feel you deserve.    01/28/2018  f/u ov/Alexander Kirby re: GOLD IV / 02 dep/ LLL nodule Chief Complaint  Patient presents with  . Follow-up    He is c/o increased SOB since this morning. He has a prod cough with yellow sputum.  He is using his proair once daily on average and he has not used neb.    Dyspnea:  Uses handicap parking /never shops anymore =  MMRC3 = can't walk 100 yards even at a slow pace at a flat grade s stopping due to sob   Cough: usual cough sporadic every day or two - worse on exp to hot/humid conditions  Sleeping: 2 pillows / 2lpm flat bed  SABA use: once a day  02:  2lpm hs/  During the day turns up 2.5 lpm     No obvious other patterns in  day to day or daytime variability or assoc excess/ purulent sputum or mucus plugs or hemoptysis or cp or chest tightness, subjective wheeze or overt sinus or hb symptoms.   Sleeping as above without nocturnal  or early am exacerbation  of respiratory c/o's or need for noct saba. Also denies any obvious fluctuation of symptoms with weather or environmental changes or other aggravating or alleviating factors except as outlined above   No unusual exposure hx or h/o childhood pna/ asthma or knowledge of premature birth.  Current Allergies, Complete Past Medical History, Past Surgical History, Family History, and Social History were reviewed in [employer] record.  ROS  The following are not active complaints unless bolded Hoarseness, sore throat, dysphagia, dental problems, itching, sneezing,  nasal congestion or  discharge of excess mucus or purulent secretions, ear ache,   fever, chills, sweats, unintended wt loss or wt gain, classically pleuritic or exertional cp,  orthopnea pnd or arm/hand swelling  or leg swelling, presyncope, palpitations, abdominal pain, anorexia, nausea, vomiting, diarrhea  or change in bowel habits or change in bladder habits, change in stools or change in urine, dysuria, hematuria,  rash, arthralgias, visual complaints, headache, numbness, weakness or ataxia or problems with walking or coordination,  change in mood or  memory.        Current Meds  Medication Sig  . Aclidinium Bromide (TUDORZA PRESSAIR) 400 MCG/ACT AEPB Inhale 1 puff into the lungs 2 (two) times daily.  Marland Kitchen albuterol (PROVENTIL) (2.5 MG/3ML) 0.083% nebulizer solution Take 2.5 mg by nebulization every 4 (four) hours as needed. With atrovent  . budesonide-formoterol (SYMBICORT) 160-4.5 MCG/ACT inhaler Inhale 2 puffs into the lungs 2 (two) times daily.  . colestipol (COLESTID) 1 G tablet Take 1 g by mouth daily.  . finasteride (PROSCAR) 5 MG tablet Take 5 mg by mouth daily.  . furosemide (LASIX) 80 MG tablet Take 80 mg by mouth daily.  Marland Kitchen guaiFENesin (MUCINEX) 600 MG 12 hr tablet Take 2 tablets (1,200 mg total) by mouth 2 (two) times daily. (Patient taking differently: Take 600 mg by mouth 2 (two) times daily as needed for cough or to loosen phlegm. )  . insulin glargine (LANTUS) 100 UNIT/ML injection Inject 15 Units into the skin at bedtime. (Patient taking differently: Inject 25 Units into the skin 2 (two) times daily. )  . levothyroxine (SYNTHROID, LEVOTHROID) 75 MCG tablet Take 75 mcg by mouth daily before breakfast.  . loratadine (CLARITIN) 10 MG tablet Take 10 mg by mouth daily as needed.   Marland Kitchen NIFEdipine (PROCARDIA XL/ADALAT-CC) 60 MG 24 hr tablet Take 60 mg by mouth every morning.  . OXYGEN Oxygen 2lpm 24/7  . Potassium Chloride 40 MEQ/15ML (20%) SOLN Take 15 mLs by mouth daily.  Marland Kitchen PROAIR HFA 108 (90 Base) MCG/ACT  inhaler FOR DIRECTIONS ON HOW TO   TAKE THIS MEDICINE, READ   THE ENCLOSED MEDICATION    INFORMATION FORM  . Probiotic Product (ALIGN) 4 MG CAPS Take 1 capsule by mouth daily.  . Red Yeast Rice Extract (RED YEAST RICE PO)  Take 1 tablet by mouth 3 (three) times daily.   Marland Kitchen Respiratory Therapy Supplies (FLUTTER) DEVI Use as directed  . rivaroxaban (XARELTO) 20 MG TABS tablet Take 20 mg by mouth daily with supper.  . Tamsulosin HCl (FLOMAX) 0.4 MG CAPS Take 0.4 mg by mouth at bedtime.              Objective:  Physical Exam   Chronically ill w/c bound  wm nad   Vital signs reviewed - Note on arrival 02 sats  95% on 2.5 lpm   03/24/2014     210  >  03/21/2015  214 > 09/28/2015  > 01/03/2016   209  > 04/03/2016 200 >  07/03/2016  209  > 10/02/2016   200 >  04/29/2017   212 > 07/30/2017  211 > 01/28/2018  206     HEENT: top dentures/ bottom partial  oropharynx. Nl external ear canals without cough reflex -  Mild bilateral non-specific turbinate edema     NECK :  without JVD/Nodes/TM/ nl carotid upstrokes bilaterally   LUNGS: no acc muscle use,  Mod barrel  contour chest wall with bilateral  Distant bs s audible wheeze and  without cough on insp or exp maneuver and mod  Hyperresonant  to  percussion bilaterally     CV:  RRR  no s3 or murmur or increase in P2, and trace pitting edema both lower ext sym   ABD:  Obese/ soft and nontender with pos mid  insp Hoover's  in the supine position. No bruits or organomegaly appreciated, bowel sounds nl  MS:     ext warm without deformities, calf tenderness, cyanosis or clubbing No obvious joint restrictions   SKIN: warm and dry without lesions    NEURO:  alert, approp, nl sensorium with  no motor or cerebellar deficits apparent.       CXR PA and Lateral:   01/28/2018 :    I personally reviewed images and agree with radiology impression as follows:   COPD/chronic interstitial changes, stable. Nodular density again noted in the left mid lung, shown  on prior CT from 2017 to be within the superior segment of the left lower lobe. No visible change by plain film.             Assessment & Plan:

## 2018-01-28 NOTE — Assessment & Plan Note (Signed)
-   03/24/2014   Walked 2 lpm nl pace but stopped x 2  And completed  one lap @ 185 stopped due to   Sob with adequate sats - 03/24/14 HCO3 = 33 c/w hypercarbia   rx as of 01/28/2018  = 2-2.5 lpm 24/7 unless needs to titrate up walking with goal of > 90%

## 2018-01-28 NOTE — Assessment & Plan Note (Signed)
-   quit smoking 1990s  - PFTs 03/24/2014 FEV1  0.85 (27%) ratio 45 and dlco 26 corrects to 52%  - 03/24/14 trial of LAMA and d/c SAMA = tudorza 1 bid  - 08/11/2014  100% with dpi  - 08/11/2014 declined rehab  - re- instructed on flutter 01/03/2016   - 07/03/2016   try spiriva in place of tudorza which is not covered on plan > ? Muscle aches - 10/02/2016  After extensive coaching HFA effectiveness =    90% with SMI > rechallenge with spriva respimat but again had neck muscle aches resolved off it > approved for tudorza and better again   - 01/28/2018  After extensive coaching inhaler device  effectiveness =    90% - 01/28/2018 offered home pulmonary rehab > declined     Group D in terms of symptom/risk and laba/lama/ICS  therefore appropriate rx at this point = symb 160/ tudorza and no change in rx needed    I had an extended discussion with the patient reviewing all relevant studies completed to date and  lasting 15 to 20 minutes of a 25 minute visit    See device teaching which extended face to face time for this visit    Each maintenance medication was reviewed in detail including most importantly the difference between maintenance and prns and under what circumstances the prns are to be triggered using an action plan format that is not reflected in the computer generated alphabetically organized AVS but trather by a customized med calendar that reflects the AVS meds with confirmed 100% correlation.   In addition, Please see AVS for unique instructions that I personally wrote and verbalized to the the pt in detail and then reviewed with pt  by my nurse highlighting any  changes in therapy recommended at today's visit to their plan of care.

## 2018-01-28 NOTE — Telephone Encounter (Signed)
Pt here now for appt and cxr ordered

## 2018-01-28 NOTE — Patient Instructions (Signed)
No change in medications ° °See calendar for specific medication instructions and bring it back for each and every office visit for every healthcare provider you see.  Without it,  you may not receive the best quality medical care that we feel you deserve. ° °You will note that the calendar groups together  your maintenance  medications that are timed at particular times of the day.  Think of this as your checklist for what your doctor has instructed you to do until your next evaluation to see what benefit  there is  to staying on a consistent group of medications intended to keep you well.  The other group at the bottom is entirely up to you to use as you see fit  for specific symptoms that may arise between visits that require you to treat them on an as needed basis.  Think of this as your action plan or "what if" list.  ° °Separating the top medications from the bottom group is fundamental to providing you adequate care going forward.   ° ° °Please schedule a follow up visit in 3 months but call sooner if needed  °

## 2018-01-28 NOTE — Assessment & Plan Note (Addendum)
CT chest on 08/31/15 : 1.9 cm mass on the left abutting the visceral pleura and complete RML atx , high grade stenosis of proximal right mainstem bronchus w/ notable endobronchial mass. And a Exophytic 2.1cm mass distal body of stomach. No mediastinal or hilar adnenopathy noted.  >PET scan 07/22/5571  >hypermetabolic LLL nodule  @ 2.2 suv and neg otherwise   - cxr 01/28/2018  No change LLL nodule    Discussed in detail all the  indications, usual  risks and alternatives  relative to the benefits with patient who agrees to proceed with conservative f/u with cxr only

## 2018-01-28 NOTE — Telephone Encounter (Addendum)
Plan: Please schedule a follow up visit in 110months but call sooner if needed with cxr on return   Attempted to call pt's spouse Jana Half but unable to reach her.  Left message for Jana Half to return call x1

## 2018-01-29 NOTE — Progress Notes (Signed)
Spoke with pt and notified of results per Dr. Wert. Pt verbalized understanding and denied any questions. 

## 2018-01-31 ENCOUNTER — Encounter: Payer: Self-pay | Admitting: Internal Medicine

## 2018-03-27 ENCOUNTER — Telehealth: Payer: Self-pay | Admitting: Internal Medicine

## 2018-03-27 DIAGNOSIS — J449 Chronic obstructive pulmonary disease, unspecified: Secondary | ICD-10-CM

## 2018-03-27 NOTE — Telephone Encounter (Signed)
Called and spoke with patients wife, she wants to know if patient will need a cxr before appointment. MW please advise, thank you.

## 2018-03-27 NOTE — Telephone Encounter (Signed)
That's fine dx copd

## 2018-03-28 NOTE — Telephone Encounter (Signed)
Called and spoke with wife. Let her know Dr. Melvyn Novas was okay with having an x-ray prior to visit. Wife states they will be here to get it just before the [place] on 04/25/2018. I let her know if patient starts to feel worse to please call our office.   Order for Chest X-ray placed  Nothing further needed.

## 2018-04-21 ENCOUNTER — Ambulatory Visit (INDEPENDENT_AMBULATORY_CARE_PROVIDER_SITE_OTHER)
Admission: RE | Admit: 2018-04-21 | Discharge: 2018-04-21 | Disposition: A | Discharge status: Home / Self Care | Payer: Medicare Other | Source: Ambulatory Visit | Attending: Internal Medicine | Admitting: Internal Medicine

## 2018-04-21 DIAGNOSIS — J449 Chronic obstructive pulmonary disease, unspecified: Secondary | ICD-10-CM | POA: Diagnosis not present

## 2018-04-25 ENCOUNTER — Ambulatory Visit: Payer: Medicare Other | Admitting: Internal Medicine

## 2018-05-02 ENCOUNTER — Ambulatory Visit: Payer: Medicare Other | Admitting: Internal Medicine

## 2018-05-06 ENCOUNTER — Ambulatory Visit (INDEPENDENT_AMBULATORY_CARE_PROVIDER_SITE_OTHER): Payer: Medicare Other | Admitting: Internal Medicine

## 2018-05-06 ENCOUNTER — Encounter: Payer: Self-pay | Admitting: Internal Medicine

## 2018-05-06 DIAGNOSIS — R911 Solitary pulmonary nodule: Secondary | ICD-10-CM | POA: Diagnosis not present

## 2018-05-06 DIAGNOSIS — J449 Chronic obstructive pulmonary disease, unspecified: Secondary | ICD-10-CM | POA: Diagnosis not present

## 2018-05-06 DIAGNOSIS — J9612 Chronic respiratory failure with hypercapnia: Secondary | ICD-10-CM

## 2018-05-06 NOTE — Progress Notes (Signed)
Subjective:   Patient ID: Alexander Kirby, male    DOB: 1934/06/30,  MRN: [id number]    Brief patient profile:  23  yowm quit smoking 1994 admit to [facility] 2014 but improved to point where did not need 02 as of March 2015 but persistent doe x 100 ft on  Level grade got worse April 2015 with sob and cough with production of of green mucus some better p steroid shot referred to pulmonary clinic 01/27/2014 by Dr Lisbeth Ply with documented GOLD IV COPD 03/24/14     History of Present Illness  01/27/2014 1st [facility] office visit/ Korissa Horsford  Chief Complaint  Patient presents with  . Pulmonary Consult    Referred by Dr. Daiva Eves. Pt states dxed with COPD approx 10 yrs ago. Pt c/o increased cough and SOB since April 2014. He gets SOB walking minimal distances.  Cough is prod with moderate green sputum.   usual routine > sleeps sitting  up on several pillows / sob when wakes but not typically prematurely or full of phlegm, then immediately use duoneb / uses symbicort 160 last but poor hfa  rec Plan A = automatic =symbicort 160 Take 2 puffs first thing in am and then another 2 puffs about 12 hours later.                                    ipatropium after symbicort and a total of 4 x daily  Plan B = backup  As needed up to every 4 hours, ok to use  neb alb at home or maxair  Prednisone 10 mg take  4 each am x 2 days,   2 each am x 2 days,  1 each am x 2 days and stop  Augmentin 875 mg take one pill twice daily  X 10 days  Work on inhaler technique:           09/09/2015 NP Follow up : COPD  Pt presents for abnormal CT chest  Recently seen by urology for hematuria found to have lung nodule on CT.   rec Continue Symbicort and Tudorza.  Set up for PET scan for lung nodule =  2.2 SUV     07/30/2017  f/u ov/Arrietty Dercole re:  GOLD IV/ 02 dep/ LLL spn  Chief Complaint  Patient presents with  . Follow-up    Breathing is unchanged.  He is using his proair 2 x daily on average and neb 3 x per wk.     Dyspnea:  On 02 up to 3lpm doe = MMRC3 = can't walk 100 yards even at a slow pace at a flat grade s stopping due to sob   Cough: some am congestion> mucoid/beige/ min qod Sleep: on 2lpm 2 pillows  rec See calendar for specific medication instructions and bring it back for each and every office visit for every healthcare provider you see.  Without it,  you may not receive the best quality medical care that we feel you deserve.    01/28/2018  f/u ov/Cheryllynn Sarff re: GOLD IV / 02 dep/ LLL nodule Chief Complaint  Patient presents with  . Follow-up    He is c/o increased SOB since this morning. He has a prod cough with yellow sputum.  He is using his proair once daily on average and he has not used neb.   Dyspnea:  Uses handicap parking /never shops anymore = MMRC3 =  can't walk 100 yards even at a slow pace at a flat grade s stopping due to sob   Cough: usual cough sporadic every day or two - worse on exp to hot/humid conditions  Sleeping: 2 pillows / 2lpm flat bed  SABA use: once a day  02:  2lpm hs/  During the day turns up 2.5 lpm   rec Follow med calendar     05/06/2018  f/u ov/Rica Heather re: GOLD IV / 02 dep  Chief Complaint  Patient presents with  . Follow-up    Breathing is unchanged. He is using his albuterol inhaler 1-2 x per day. He has not needed neb recently.  Dyspnea:  25-50 ft = MMRC3 = can't walk 100 yards even at a slow pace at a flat grade s stopping due to sob   Cough: min cough mucous Sleeping: flat bed/ 2 pillows  SABA use: 1-2 x daily just the hfa saba  02: 2lpm hs/ 2.5 during the day/ not checking sats with walking as rec   No obvious day to day or daytime variability or assoc excess/ purulent sputum or mucus plugs or hemoptysis or cp or chest tightness, subjective wheeze or overt sinus or hb symptoms.   Sleeps as above  without nocturnal  or early am exacerbation  of respiratory  c/o's or need for noct saba. Also denies any obvious fluctuation of symptoms with weather or  environmental changes or other aggravating or alleviating factors except as outlined above   No unusual exposure hx or h/o childhood pna/ asthma or knowledge of premature birth.  Current Allergies, Complete Past Medical History, Past Surgical History, Family History, and Social History were reviewed in [employer] record.  ROS  The following are not active complaints unless bolded Hoarseness, sore throat, dysphagia, dental problems, itching, sneezing,  nasal congestion or discharge of excess mucus or purulent secretions, ear ache,   fever, chills, sweats, unintended wt loss or wt gain, classically pleuritic or exertional cp,  orthopnea pnd or arm/hand swelling  or leg swelling improved , presyncope, palpitations, abdominal pain, anorexia, nausea, vomiting, diarrhea  or change in bowel habits or change in bladder habits, change in stools or change in urine, dysuria, hematuria,  rash, arthralgias, visual complaints, headache, numbness, weakness or ataxia or problems with walking or coordination,  change in mood or  memory.        Current Meds  Medication Sig  . Aclidinium Bromide (TUDORZA PRESSAIR) 400 MCG/ACT AEPB Inhale 1 puff into the lungs 2 (two) times daily.  Marland Kitchen albuterol (PROVENTIL) (2.5 MG/3ML) 0.083% nebulizer solution Take 2.5 mg by nebulization every 4 (four) hours as needed. With atrovent  . budesonide-formoterol (SYMBICORT) 160-4.5 MCG/ACT inhaler Inhale 2 puffs into the lungs 2 (two) times daily.  . colestipol (COLESTID) 1 G tablet Take 1 g by mouth daily.  . finasteride (PROSCAR) 5 MG tablet Take 5 mg by mouth daily.  . furosemide (LASIX) 80 MG tablet Take 80 mg by mouth daily.  Marland Kitchen guaiFENesin (MUCINEX) 600 MG 12 hr tablet Take 2 tablets (1,200 mg total) by mouth 2 (two) times daily. (Patient taking differently: Take 600 mg by mouth 2 (two) times daily as needed for cough or to loosen phlegm. )  . insulin glargine (LANTUS) 100 UNIT/ML injection Inject 15 Units  into the skin at bedtime. (Patient taking differently: Inject 25 Units into the skin 2 (two) times daily. )  . levothyroxine (SYNTHROID, LEVOTHROID) 75 MCG tablet Take 75 mcg by mouth  daily before breakfast.  . loratadine (CLARITIN) 10 MG tablet Take 10 mg by mouth daily as needed.   Marland Kitchen NIFEdipine (PROCARDIA XL/ADALAT-CC) 60 MG 24 hr tablet Take 60 mg by mouth every morning.  . OXYGEN Oxygen 2lpm 24/7  . Potassium Chloride 40 MEQ/15ML (20%) SOLN Take 15 mLs by mouth daily.  Marland Kitchen PROAIR HFA 108 (90 Base) MCG/ACT inhaler FOR DIRECTIONS ON HOW TO   TAKE THIS MEDICINE, READ   THE ENCLOSED MEDICATION    INFORMATION FORM  . Probiotic Product (ALIGN) 4 MG CAPS Take 1 capsule by mouth daily.  . Red Yeast Rice Extract (RED YEAST RICE PO) Take 1 tablet by mouth 3 (three) times daily.   Marland Kitchen Respiratory Therapy Supplies (FLUTTER) DEVI Use as directed  . rivaroxaban (XARELTO) 20 MG TABS tablet Take 20 mg by mouth daily with supper.  . Tamsulosin HCl (FLOMAX) 0.4 MG CAPS Take 0.4 mg by mouth at bedtime.               Objective:  Physical Exam  Chronically ill w/c bound    Vital signs reviewed - Note on arrival 02 sats  95% on 2lpm continuous     03/24/2014     210  >  03/21/2015  214 > 09/28/2015  > 01/03/2016   209  > 04/03/2016 200 >  07/03/2016  209  > 10/02/2016   200 >  04/29/2017   212 > 07/30/2017  211 > 01/28/2018  206> 05/06/2018   218     HEENT: top dentures/ bottom partial    Trace ankle edema bilaterally    HEENT: Top dentures/ nl  oropharynx. Nl external ear canals without cough reflex -  Mod bilateral non-specific turbinate edema     NECK :  without JVD/Nodes/TM/ nl carotid upstrokes bilaterally   LUNGS: no acc muscle use,  Mod barrel  contour chest wall with bilateral  Distant bs s audible wheeze and  without cough on insp or exp maneuver and mod  Hyperresonant  to  percussion bilaterally     CV:  RRR  no s3 or murmur or increase in P2, and trace sym bilateral ankle  edema   ABD:  soft  and nontender with pos mid insp Hoover's  No bruits or organomegaly appreciated, bowel sounds nl  MS:      ext warm without deformities, calf tenderness, cyanosis or clubbing No obvious joint restrictions   SKIN: warm and dry without lesions    NEURO:  alert, approp, nl sensorium with  no motor or cerebellar deficits apparent.             Assessment & Plan:

## 2018-05-06 NOTE — Patient Instructions (Signed)
No change in medications   Please schedule a follow up visit in 3 months but call sooner if needed with cxr

## 2018-05-07 ENCOUNTER — Encounter: Payer: Self-pay | Admitting: Internal Medicine

## 2018-05-07 NOTE — Assessment & Plan Note (Signed)
-   quit smoking 1990s  - PFTs 03/24/2014 FEV1  0.85 (27%) ratio 45 and dlco 26 corrects to 52%  - 03/24/14 trial of LAMA and d/c SAMA = tudorza 1 bid  - 08/11/2014  100% with dpi  - 08/11/2014 declined rehab  - re- instructed on flutter 01/03/2016   - 07/03/2016   try spiriva in place of tudorza which is not covered on plan > ? Muscle aches - 10/02/2016  After extensive coaching HFA effectiveness =    90% with SMI > rechallenge with spriva respimat but again had neck muscle aches resolved off it > approved for tudorza and better again  - 01/28/2018  After extensive coaching inhaler device  effectiveness =    90%    He has very severe dz clinically c/w  Group D in terms of symptom/risk and laba/lama/ICS  therefore appropriate rx at this point so no change in rx feasible   Encouraged more walking but needs to monitor sats while ex (see separate a/p)

## 2018-05-07 NOTE — Assessment & Plan Note (Signed)
-   03/24/2014   Walked 2 lpm nl pace but stopped x 2  And completed  one lap @ 185 stopped due to   Sob with adequate sats - 03/24/14 HCO3 = 33 c/w hypercarbia   rx as of 05/06/2018  = 2-2.5 lpm 24/7 unless needs to titrate up walking with goal of > 90%

## 2018-05-07 NOTE — Assessment & Plan Note (Signed)
CT chest on 08/31/15 : 1.9 cm mass on the left abutting the visceral pleura and complete RML atx , high grade stenosis of proximal right mainstem bronchus w/ notable endobronchial mass. And a Exophytic 2.1cm mass distal body of stomach. No mediastinal or hilar adnenopathy noted.  >PET scan 09/18/5954  >hypermetabolic LLL nodule  @ 2.2 suv and neg otherwise  - cxr 01/28/2018  No change LLL nodule    Discussed in detail all the  indications, usual  risks and alternatives  relative to the benefits with patient who agrees to proceed with conservative f/u with cxr in 3 months unless symptoms develop that may be amenable to palliative measures (cp/ hemoptysis) as any intervention including localize RT may be enough to literally push him over the edge to resp decompensation    I had an extended discussion with the patient/wife  reviewing all relevant studies completed to date and  lasting 15 to 20 minutes of a 25 minute visit    Each maintenance medication was reviewed in detail including most importantly the difference between maintenance and prns and under what circumstances the prns are to be triggered using an action plan format that is not reflected in the computer generated alphabetically organized AVS.     Please see AVS for specific instructions unique to this visit that I personally wrote and verbalized to the the pt in detail and then reviewed with pt  by my nurse highlighting any  changes in therapy recommended at today's visit to their plan of care.

## 2018-07-29 ENCOUNTER — Telehealth: Payer: Self-pay | Admitting: Internal Medicine

## 2018-07-29 NOTE — Telephone Encounter (Signed)
atc unable to reach will f/u

## 2018-07-30 NOTE — Telephone Encounter (Signed)
Fax received from [place]. Form filled out and taken to Dr. Melvyn Novas for signature Form to be faxed to [phone].

## 2018-07-30 NOTE — Telephone Encounter (Signed)
Called patient, unable to reach left message to give us a call back. 

## 2018-07-30 NOTE — Telephone Encounter (Signed)
I do not have anything on this pt

## 2018-07-30 NOTE — Telephone Encounter (Signed)
Called and spoke with patient, she stated that [facility] was to send [place] a fax with the patients information on it for the prescription of Tudorza. They are needing to get the price lowered for the patient. Checked box downstairs and did not see this. Magda Paganini please advise if you have this form. Thank you.

## 2018-07-30 NOTE — Telephone Encounter (Signed)
Form signed by Dr. Melvyn Novas. Faxed to [facility] [phone] Confirmation received, sent to be scanned into chart.

## 2018-08-12 ENCOUNTER — Ambulatory Visit: Payer: Medicare Other | Admitting: Internal Medicine

## 2018-08-13 ENCOUNTER — Ambulatory Visit (INDEPENDENT_AMBULATORY_CARE_PROVIDER_SITE_OTHER)
Admission: RE | Admit: 2018-08-13 | Discharge: 2018-08-13 | Disposition: A | Discharge status: Home / Self Care | Payer: Medicare Other | Source: Ambulatory Visit | Attending: Internal Medicine | Admitting: Internal Medicine

## 2018-08-13 ENCOUNTER — Encounter: Payer: Self-pay | Admitting: Internal Medicine

## 2018-08-13 ENCOUNTER — Ambulatory Visit (INDEPENDENT_AMBULATORY_CARE_PROVIDER_SITE_OTHER): Payer: Medicare Other | Admitting: Internal Medicine

## 2018-08-13 VITALS — BP 130/76 | HR 78 | Ht 71.0 in | Wt 219.0 lb

## 2018-08-13 DIAGNOSIS — R911 Solitary pulmonary nodule: Secondary | ICD-10-CM

## 2018-08-13 DIAGNOSIS — J9612 Chronic respiratory failure with hypercapnia: Secondary | ICD-10-CM

## 2018-08-13 DIAGNOSIS — J449 Chronic obstructive pulmonary disease, unspecified: Secondary | ICD-10-CM

## 2018-08-13 NOTE — Assessment & Plan Note (Addendum)
Quit smoking 1990s  - PFTs 03/24/2014 FEV1  0.85 (27%) ratio 45 and dlco 26 corrects to 52%  - 03/24/14 trial of LAMA and d/c SAMA = tudorza 1 bid  - 08/11/2014  100% with dpi  - 08/11/2014 declined rehab  - re- instructed on flutter 01/03/2016   - 07/03/2016   try spiriva in place of tudorza which is not covered on plan > ? Muscle aches - 10/02/2016    rechallenged  with spriva respimat but again had neck muscle aches resolved off it > approved for tudorza and better again    - 08/13/2018  After extensive coaching inhaler device,  effectiveness =    75%  (short Ti)    Group D in terms of symptom/risk and laba/lama/ICS  therefore appropriate rx at this point >>>  Continue symb 160 and tudorza / encouraged more activity

## 2018-08-13 NOTE — Assessment & Plan Note (Signed)
-   03/24/2014   Walked 2 lpm nl pace but stopped x 2  And completed  one lap @ 185 stopped due to   Sob with adequate sats - 03/24/14 HCO3 = 33 c/w hypercarbia   rx as of 08/13/2018  = 2-2.5 lpm 24/7 unless needs to titrate up walking with goal of > 90%  - emphasized again need to titrate with activity and call if still not saturating over 90% on max rx per his portable device    Each maintenance medication was reviewed in detail including most importantly the difference between maintenance and as needed and under what circumstances the prns are to be used.  Please see AVS for specific  Instructions which are unique to this visit and I personally typed out  which were reviewed in detail in writing with the patient and a copy provided.

## 2018-08-13 NOTE — Patient Instructions (Addendum)
Please remember to go to the  x-ray department  for your tests - we will call you with the results when they are available.    Work on inhaler technique:  relax and gently blow all the way out then take a nice smooth deep breath back in, triggering the inhaler at same time you start breathing in.  Hold for up to 5 seconds if you can. Blow out thru nose. Rinse and gargle with water when done.     Please schedule a follow up visit in 3 months but call sooner if needed

## 2018-08-13 NOTE — Progress Notes (Signed)
Subjective:   Patient ID: Alexander Kirby, male    DOB: 04-Dec-1934,  MRN: [id number]    Brief patient profile:  46  yowm quit smoking 1994 admit to [facility] [place] 2014 but improved to point where did not need 02 as of March 2015 but persistent doe x 100 ft on  Level grade got worse April 2015 with sob and cough with production of of green mucus some better p steroid shot referred to pulmonary clinic 01/27/2014 by Dr Lisbeth Ply with documented GOLD IV COPD 03/24/14     History of Present Illness  01/27/2014 1st [facility] office visit/ Tera Pellicane  Chief Complaint  Patient presents with  . Pulmonary Consult    Referred by Dr. Daiva Eves. Pt states dxed with COPD approx 10 yrs ago. Pt c/o increased cough and SOB since April 2014. He gets SOB walking minimal distances.  Cough is prod with moderate green sputum.   usual routine > sleeps sitting  up on several pillows / sob when wakes but not typically prematurely or full of phlegm, then immediately use duoneb / uses symbicort 160 last but poor hfa  rec Plan A = automatic =symbicort 160 Take 2 puffs first thing in am and then another 2 puffs about 12 hours later.                                    ipatropium after symbicort and a total of 4 x daily  Plan B = backup  As needed up to every 4 hours, ok to use  neb alb at home or maxair  Prednisone 10 mg take  4 each am x 2 days,   2 each am x 2 days,  1 each am x 2 days and stop  Augmentin 875 mg take one pill twice daily  X 10 days  Work on inhaler technique:             05/06/2018  f/u ov/Nyx Keady re: GOLD IV / 02 dep  Chief Complaint  Patient presents with  . Follow-up    Breathing is unchanged. He is using his albuterol inhaler 1-2 x per day. He has not needed neb recently.  Dyspnea:  25-50 ft = MMRC3 = can't walk 100 yards even at a slow pace at a flat grade s stopping due to sob   Cough: min cough mucous Sleeping: flat bed/ 2 pillows  SABA use: 1-2 x daily just the hfa saba  02: 2lpm hs/ 2.5  during the day/ not checking sats with walking as rec  rec No change in medications  Please schedule a follow up visit in 3 months but call sooner if needed with cxr      08/13/2018  f/u ov/Tyhesha Dutson re: GOLD IV/ 02 dep on symbicort and tudorza  Chief Complaint  Patient presents with  . Follow-up    Breathing is about the same "maybe a little worse".  He is using his rescue inhaler 2 x daily on average. He rarely uses neb.   Dyspnea:  MMRC3 = can't walk 100 yards even at a slow pace at a flat grade s stopping due to sob  Even on 02  Cough: qod coughing spells, just clear mucus/ rattling quality  Sleeping: flat bed/ 2 pillows  SABA use: none at hs, during the day if gets hot/stuffy feels he needs saba at rest though hfa not optimal  - see a/p  02: 2- 2.5 - not titrating 02 with activity as rec    No obvious day to day or daytime variability or assoc   purulent sputum or mucus plugs or hemoptysis or cp or chest tightness, subjective wheeze or overt sinus or hb symptoms.    Sleeping as above  without nocturnal  or early am exacerbation  of respiratory  c/o's or need for noct saba. Also denies any obvious fluctuation of symptoms with weather or environmental changes or other aggravating or alleviating factors except as outlined above   No unusual exposure hx or h/o childhood pna/ asthma or knowledge of premature birth.  Current Allergies, Complete Past Medical History, Past Surgical History, Family History, and Social History were reviewed in [employer] record.  ROS  The following are not active complaints unless bolded Hoarseness, sore throat, dysphagia, dental problems, itching, sneezing,  nasal congestion or discharge of excess mucus or purulent secretions, ear ache,   fever, chills, sweats, unintended wt loss or wt gain, classically pleuritic or exertional cp,  orthopnea pnd or arm/hand swelling  or leg swelling improved , presyncope, palpitations, abdominal pain,  anorexia, nausea, vomiting, diarrhea  or change in bowel habits or change in bladder habits, change in stools or change in urine, dysuria, hematuria,  rash, arthralgias, visual complaints, headache, numbness, weakness or ataxia or problems with walking or coordination,  change in mood or  memory.        Current Meds  Medication Sig  . Aclidinium Bromide (TUDORZA PRESSAIR) 400 MCG/ACT AEPB Inhale 1 puff into the lungs 2 (two) times daily.  Marland Kitchen albuterol (PROVENTIL) (2.5 MG/3ML) 0.083% nebulizer solution Take 2.5 mg by nebulization every 4 (four) hours as needed. With atrovent  . budesonide-formoterol (SYMBICORT) 160-4.5 MCG/ACT inhaler Inhale 2 puffs into the lungs 2 (two) times daily.  . colestipol (COLESTID) 1 G tablet Take 1 g by mouth daily.  . finasteride (PROSCAR) 5 MG tablet Take 5 mg by mouth daily.  . furosemide (LASIX) 80 MG tablet Take 80 mg by mouth daily.  Marland Kitchen guaiFENesin (MUCINEX) 600 MG 12 hr tablet Take 2 tablets (1,200 mg total) by mouth 2 (two) times daily. (Patient taking differently: Take 600 mg by mouth 2 (two) times daily as needed for cough or to loosen phlegm. )  . insulin glargine (LANTUS) 100 UNIT/ML injection Inject 15 Units into the skin at bedtime. (Patient taking differently: Inject 25 Units into the skin 2 (two) times daily. )  . levothyroxine (SYNTHROID, LEVOTHROID) 75 MCG tablet Take 75 mcg by mouth daily before breakfast.  . loratadine (CLARITIN) 10 MG tablet Take 10 mg by mouth daily as needed.   Marland Kitchen NIFEdipine (PROCARDIA XL/ADALAT-CC) 60 MG 24 hr tablet Take 60 mg by mouth every morning.  . OXYGEN Oxygen 2lpm 24/7  . Potassium Chloride 40 MEQ/15ML (20%) SOLN Take 15 mLs by mouth daily.  Marland Kitchen PROAIR HFA 108 (90 Base) MCG/ACT inhaler FOR DIRECTIONS ON HOW TO   TAKE THIS MEDICINE, READ   THE ENCLOSED MEDICATION    INFORMATION FORM  . Probiotic Product (ALIGN) 4 MG CAPS Take 1 capsule by mouth daily.  . Red Yeast Rice Extract (RED YEAST RICE PO) Take 1 tablet by mouth 3  (three) times daily.   Marland Kitchen Respiratory Therapy Supplies (FLUTTER) DEVI Use as directed  . rivaroxaban (XARELTO) 20 MG TABS tablet Take 20 mg by mouth daily with supper.  . Tamsulosin HCl (FLOMAX) 0.4 MG CAPS Take 0.4 mg by mouth at bedtime.  Objective:  Physical Exam     03/24/2014     210  >  03/21/2015  214 > 09/28/2015  > 01/03/2016   209  > 04/03/2016 200 >  07/03/2016  209  > 10/02/2016   200 >  04/29/2017   212 > 07/30/2017  211 > 01/28/2018  206> 05/06/2018   218  > 08/13/2018  219     w/c bound chronically ill wm nad   Vital signs reviewed - Note on arrival 02 sats  95 % on 2lpm cont     HEENT: Top dentures/ lower partial with nl  oropharynx. Nl external ear canals without cough reflex -  Mild bilateral non-specific turbinate edema     NECK :  without JVD/Nodes/TM/ nl carotid upstrokes bilaterally   LUNGS: no acc muscle use,  Mod barrel  contour chest wall with bilateral  Distant bs s audible wheeze and  without cough on insp or exp maneuver and mod   Hyperresonant  to  percussion bilaterally     CV:  RRR  no s3 or murmur or increase in P2, and no edema   ABD:  soft and nontender with pos mid  insp Hoover's  in the supine position. No bruits or organomegaly appreciated, bowel sounds nl  MS:   ext warm without deformities, calf tenderness, cyanosis or clubbing No obvious joint restrictions   SKIN: warm and dry without lesions    NEURO:  alert, approp, nl sensorium with  no motor or cerebellar deficits apparent though did not walk today .          CXR PA and Lateral:   08/13/2018 :    I personally reviewed images and agree with radiology impression as follows:   COPD changes with chronic interstitial disease at the lung bases in LEFT upper lobe scarring. LEFT midlung nodule appears minimally larger when compared to studies back to 2015; CT imaging is recommended to assess stability.       Assessment & Plan:

## 2018-08-17 NOTE — Assessment & Plan Note (Addendum)
1st viz on plain cxr 01/28/14 CT chest on 08/31/15 : 1.9 cm mass on the left abutting the visceral pleura and complete RML atx , high grade stenosis of proximal right mainstem bronchus w/ notable endobronchial mass. And a Exophytic 2.1cm mass distal body of stomach. No mediastinal or hilar adnenopathy noted.  >PET scan 07/25/3783  >hypermetabolic LLL nodule  @ 2.2 suv and neg otherwise  - cxr 01/28/2018  No change LLL nodule  - cxr 08/13/2018   Min change going back to 2015 can no longer be seen on lateral view    I strongly doubt this is any form of aggressive neoplasm and pt with multiple co-morbidities which are more relevant to his chronic progressive symptoms/ debilitation so no role for intervention in this setting - might reconsider if any symptoms require palliative approach    Discussed in detail all the  indications, usual  risks and alternatives  relative to the benefits with patient who agrees to proceed with conservative f/u as outlined     I had an extended discussion with the patient/wife reviewing all relevant studies completed to date and  lasting 15 to 20 minutes of a 25 minute visit    See device teaching which extended face to face time for this visit   Each maintenance medication was reviewed in detail including most importantly the difference between maintenance and prns and under what circumstances the prns are to be triggered using an action plan format that is not reflected in the computer generated alphabetically organized AVS.     Please see AVS for specific instructions unique to this visit that I personally wrote and verbalized to the the pt in detail and then reviewed with pt  by my nurse highlighting any  changes in therapy recommended at today's visit to their plan of care.

## 2018-08-20 ENCOUNTER — Other Ambulatory Visit: Payer: Self-pay | Admitting: Internal Medicine

## 2018-11-11 ENCOUNTER — Ambulatory Visit: Payer: Medicare Other | Admitting: Internal Medicine

## 2018-12-22 ENCOUNTER — Other Ambulatory Visit: Payer: Self-pay | Admitting: Internal Medicine

## 2018-12-24 ENCOUNTER — Telehealth: Payer: Self-pay | Admitting: Internal Medicine

## 2018-12-24 NOTE — Telephone Encounter (Signed)
Spoke with pt's wife, Jana Half. Pt has an appointment with MW on 12/30/2018 at 1400. He is apprehensive to come in to the office, he has not been around anyone in months. I advised Jana Half that we could do a telephone visit if the pt would be more comfortable with that. She agreed. Pt's appointment has been switched to a televisit as the pt does not have access to do a MyChart visit. Nothing further was needed.

## 2018-12-29 ENCOUNTER — Ambulatory Visit: Payer: Medicare Other | Admitting: Internal Medicine

## 2018-12-30 ENCOUNTER — Other Ambulatory Visit: Payer: Self-pay

## 2018-12-30 ENCOUNTER — Encounter: Payer: Self-pay | Admitting: Internal Medicine

## 2018-12-30 ENCOUNTER — Ambulatory Visit (INDEPENDENT_AMBULATORY_CARE_PROVIDER_SITE_OTHER): Payer: Medicare Other | Admitting: Internal Medicine

## 2018-12-30 DIAGNOSIS — R911 Solitary pulmonary nodule: Secondary | ICD-10-CM

## 2018-12-30 DIAGNOSIS — J449 Chronic obstructive pulmonary disease, unspecified: Secondary | ICD-10-CM

## 2018-12-30 DIAGNOSIS — J9612 Chronic respiratory failure with hypercapnia: Secondary | ICD-10-CM

## 2018-12-30 NOTE — Assessment & Plan Note (Signed)
1st viz on plain cxr 01/28/14 CT chest on 08/31/15 : 1.9 cm mass on the left abutting the visceral pleura and complete RML atx , high grade stenosis of proximal right mainstem bronchus w/ notable endobronchial mass. And a Exophytic 2.1cm mass distal body of stomach. No mediastinal or hilar adnenopathy noted.  >PET scan 09/24/1789  >hypermetabolic LLL nodule  @ 2.2 suv and neg otherwise  - cxr 01/28/2018  No change LLL nodule  - cxr 08/13/2018   Min increase on PA, not viz on lateral  Asymptomatic in pt with very severe copd  Discussed in detail all the  indications, usual  risks and alternatives  relative to the benefits with patient who agrees to proceed with conservative f/u with ov/ cxr in 3 m, call sooner pnr  Each maintenance medication was reviewed in detail including most importantly the difference between maintenance and as needed and under what circumstances the prns are to be used.  Please see AVS for specific  Instructions which are unique to this visit and I personally typed out  which were reviewed in detail over the phone  with the patient and a copy provided by mail

## 2018-12-30 NOTE — Patient Instructions (Signed)
No change in medications   Continue social distancing as much as feasible  Please schedule a follow up visit in 3 months but call sooner if needed

## 2018-12-30 NOTE — Assessment & Plan Note (Signed)
Quit smoking 1990s  - PFTs 03/24/2014 FEV1  0.85 (27%) ratio 45 and dlco 26 corrects to 52%  - 03/24/14 trial of LAMA and d/c SAMA = tudorza 1 bid  - 08/11/2014  100% with dpi  - 08/11/2014 declined rehab  - re- instructed on flutter 01/03/2016   - 07/03/2016   try spiriva in place of tudorza which is not covered on plan > ? Muscle aches - 10/02/2016  After extensive coaching HFA effectiveness =    90% with SMI > rechallenge with spriva respimat but again had neck muscle aches resolved off it > approved for tudorza and better again  - 01/28/2018  After extensive coaching inhaler device  effectiveness =    90%    Group D in terms of symptom/risk and laba/lama/ICS  therefore appropriate rx at this point >>>  Continue symb 160 and tudorza

## 2018-12-30 NOTE — Progress Notes (Signed)
Subjective:   Patient ID: Alexander Kirby, male    DOB: 04-Dec-1934,  MRN: [id number]    Brief patient profile:  46  yowm quit smoking 1994 admit to [facility] [place] 2014 but improved to point where did not need 02 as of March 2015 but persistent doe x 100 ft on  Level grade got worse April 2015 with sob and cough with production of of green mucus some better p steroid shot referred to pulmonary clinic 01/27/2014 by Dr Lisbeth Ply with documented GOLD IV COPD 03/24/14     History of Present Illness  01/27/2014 1st [facility] office visit/ [place]  Chief Complaint  Patient presents with  . Pulmonary Consult    Referred by Dr. Daiva Eves. Pt states dxed with COPD approx 10 yrs ago. Pt c/o increased cough and SOB since April 2014. He gets SOB walking minimal distances.  Cough is prod with moderate green sputum.   usual routine > sleeps sitting  up on several pillows / sob when wakes but not typically prematurely or full of phlegm, then immediately use duoneb / uses symbicort 160 last but poor hfa  rec Plan A = automatic =symbicort 160 Take 2 puffs first thing in am and then another 2 puffs about 12 hours later.                                    ipatropium after symbicort and a total of 4 x daily  Plan B = backup  As needed up to every 4 hours, ok to use  neb alb at home or maxair  Prednisone 10 mg take  4 each am x 2 days,   2 each am x 2 days,  1 each am x 2 days and stop  Augmentin 875 mg take one pill twice daily  X 10 days  Work on inhaler technique:             05/06/2018  f/u ov/Wert re: GOLD IV / 02 dep  Chief Complaint  Patient presents with  . Follow-up    Breathing is unchanged. He is using his albuterol inhaler 1-2 x per day. He has not needed neb recently.  Dyspnea:  25-50 ft = MMRC3 = can't walk 100 yards even at a slow pace at a flat grade s stopping due to sob   Cough: min cough mucous Sleeping: flat bed/ 2 pillows  SABA use: 1-2 x daily just the hfa saba  02: 2lpm hs/ 2.5  during the day/ not checking sats with walking as rec  rec No change in medications  Please schedule a follow up visit in 3 months but call sooner if needed with cxr      08/13/2018  f/u ov/Wert re: GOLD IV/ 02 dep on symbicort and tudorza  Chief Complaint  Patient presents with  . Follow-up    Breathing is about the same "maybe a little worse".  He is using his rescue inhaler 2 x daily on average. He rarely uses neb.   Dyspnea:  MMRC3 = can't walk 100 yards even at a slow pace at a flat grade s stopping due to sob  Even on 02  Cough: qod coughing spells, just clear mucus/ rattling quality  Sleeping: flat bed/ 2 pillows  SABA use: none at hs, during the day if gets hot/stuffy feels he needs saba at rest though hfa not optimal  - see a/p  02: 2- 2.5 - not titrating 02 with activity as rec  rec Please remember to go to the  x-ray department  for your tests - we will call you with the results when they are available.   Work on inhaler technique:    Virtual Visit via Telephone Note 12/30/2018   I connected with Alexander Kirby on 12/30/18 at  2:00 PM EDT by telephone and verified that I am speaking with the correct person using two identifiers.   I discussed the limitations, risks, security and privacy concerns of performing an evaluation and management service by telephone and the availability of in person appointments. I also discussed with the patient that there may be a patient responsible charge related to this service. The patient expressed understanding and agreed to proceed.   History of Present Illness: Dyspnea:  Very sedentary with covid restrictions Cough: no change sporadic pattern, clear mucus  Sleeping: 2-3 pillows  SABA use: onc3  02:  2 - 2.5 lpm    No obvious day to day or daytime variability or assoc excess/ purulent sputum or mucus plugs or hemoptysis or cp or chest tightness, subjective wheeze or overt sinus or hb symptoms.    Also denies any obvious fluctuation of  symptoms with weather or environmental changes or other aggravating or alleviating factors except as outlined above.   Meds reviewed/ med reconciliation completed     No outpatient medications have been marked as taking for the 12/30/18 encounter (Office Visit) with Tanda Rockers, MD.         Observations/Objective: Good voice texture, speaking in full sentences    Assessment and Plan: See problem list for active a/p's   Follow Up Instructions: See avs for instructions unique to this ov which includes revised/ updated med list     I discussed the assessment and treatment plan with the patient. The patient was provided an opportunity to ask questions and all were answered. The patient agreed with the plan and demonstrated an understanding of the instructions.   The patient was advised to call back or seek an in-person evaluation if the symptoms worsen or if the condition fails to improve as anticipated.  I provided 25 minutes of non-face-to-face time during this encounter.   Christinia Gully, MD

## 2018-12-30 NOTE — Assessment & Plan Note (Signed)
-   03/24/2014   Walked 2 lpm nl pace but stopped x 2  And completed  one lap @ 185 stopped due to   Sob with adequate sats - 03/24/14 HCO3 = 33 c/w hypercarbia   rx as of 12/30/2018  = 2-2.5 lpm 24/7 unless needs to titrate up walking with goal of > 90%   Adequate control on present rx, reviewed in detail with pt > no change in rx needed

## 2019-04-01 ENCOUNTER — Ambulatory Visit: Payer: Medicare Other | Admitting: Internal Medicine

## 2019-04-02 ENCOUNTER — Ambulatory Visit (INDEPENDENT_AMBULATORY_CARE_PROVIDER_SITE_OTHER): Payer: Medicare Other | Admitting: Internal Medicine

## 2019-04-02 ENCOUNTER — Encounter: Payer: Self-pay | Admitting: Internal Medicine

## 2019-04-02 DIAGNOSIS — R911 Solitary pulmonary nodule: Secondary | ICD-10-CM

## 2019-04-02 DIAGNOSIS — J9612 Chronic respiratory failure with hypercapnia: Secondary | ICD-10-CM

## 2019-04-02 DIAGNOSIS — J449 Chronic obstructive pulmonary disease, unspecified: Secondary | ICD-10-CM

## 2019-04-02 NOTE — Assessment & Plan Note (Signed)
Quit smoking 1990s  - PFTs 03/24/2014 FEV1  0.85 (27%) ratio 45 and dlco 26 corrects to 52%  - 03/24/14 trial of LAMA and d/c SAMA = tudorza 1 bid  - 08/11/2014  100% with dpi  - 08/11/2014 declined rehab  - re- instructed on flutter 01/03/2016   - 07/03/2016   try spiriva in place of tudorza which is not covered on plan > ? Muscle aches - 10/02/2016  After extensive coaching HFA effectiveness =    90% with SMI > rechallenge with spriva respimat but again had neck muscle aches resolved off it > approved for tudorza and better again  - 01/28/2018  After extensive coaching inhaler device  effectiveness =    90%     Group D in terms of symptom/risk and laba/lama/ICS  therefore appropriate rx at this point >>>  Continue symb/tudorza and prn saba

## 2019-04-02 NOTE — Assessment & Plan Note (Signed)
1st viz on plain cxr 01/28/14 CT chest on 08/31/15 : 1.9 cm mass on the left abutting the visceral pleura and complete RML atx , high grade stenosis of proximal right mainstem bronchus w/ notable endobronchial mass. And a Exophytic 2.1cm mass distal body of stomach. No mediastinal or hilar adnenopathy noted.  >PET scan [id number]  >hypermetabolic LLL nodule  @ 2.2 suv and neg otherwise  - cxr 01/28/2018  No change LLL nodule  - cxr 08/13/2018   Min increase on PA, not viz on lateral  Min growth x 5 years, very severe copd so limited options > Discussed in detail all the  indications, usual  risks and alternatives  relative to the benefits with patient who agrees to proceed with conservative f/u with cxr in 6 m

## 2019-04-02 NOTE — Assessment & Plan Note (Signed)
02 dep since 2015  - 03/24/2014   Walked 2 lpm nl pace but stopped x 2  And completed  one lap @ 185 stopped due to   Sob with adequate sats - 03/24/14 HCO3 = 33 c/w hypercarbia   rx as of 04/02/2019  = 2-2.5 lpm 24/7 unless needs to titrate up walking with goal of > 90%    Adequate control on present rx, reviewed in detail with pt > no change in rx needed     >>>> f/u @ 6 m, sooner if needed    Each maintenance medication was reviewed in detail including most importantly the difference between maintenance and as needed and under what circumstances the prns are to be used.  Please see AVS for specific  Instructions which are unique to this visit and I personally typed out  which were reviewed in detail over the phone with the patient and a copy provided.

## 2019-04-02 NOTE — Progress Notes (Signed)
Subjective:   Patient ID: Alexander Kirby, male    DOB: 04-Dec-1934,  MRN: [id number]    Brief patient profile:  46  yowm quit smoking 1994 admit to [facility] [place] 2014 but improved to point where did not need 02 as of March 2015 but persistent doe x 100 ft on  Level grade got worse April 2015 with sob and cough with production of of green mucus some better p steroid shot referred to pulmonary clinic 01/27/2014 by Dr Lisbeth Ply with documented GOLD IV COPD 03/24/14     History of Present Illness  01/27/2014 1st [facility] office visit/ Wert  Chief Complaint  Patient presents with  . Pulmonary Consult    Referred by Dr. Daiva Eves. Pt states dxed with COPD approx 10 yrs ago. Pt c/o increased cough and SOB since April 2014. He gets SOB walking minimal distances.  Cough is prod with moderate green sputum.   usual routine > sleeps sitting  up on several pillows / sob when wakes but not typically prematurely or full of phlegm, then immediately use duoneb / uses symbicort 160 last but poor hfa  rec Plan A = automatic =symbicort 160 Take 2 puffs first thing in am and then another 2 puffs about 12 hours later.                                    ipatropium after symbicort and a total of 4 x daily  Plan B = backup  As needed up to every 4 hours, ok to use  neb alb at home or maxair  Prednisone 10 mg take  4 each am x 2 days,   2 each am x 2 days,  1 each am x 2 days and stop  Augmentin 875 mg take one pill twice daily  X 10 days  Work on inhaler technique:             05/06/2018  f/u ov/Wert re: GOLD IV / 02 dep  Chief Complaint  Patient presents with  . Follow-up    Breathing is unchanged. He is using his albuterol inhaler 1-2 x per day. He has not needed neb recently.  Dyspnea:  25-50 ft = MMRC3 = can't walk 100 yards even at a slow pace at a flat grade s stopping due to sob   Cough: min cough mucous Sleeping: flat bed/ 2 pillows  SABA use: 1-2 x daily just the hfa saba  02: 2lpm hs/ 2.5  during the day/ not checking sats with walking as rec  rec No change in medications  Please schedule a follow up visit in 3 months but call sooner if needed with cxr      08/13/2018  f/u ov/Wert re: GOLD IV/ 02 dep on symbicort and tudorza  Chief Complaint  Patient presents with  . Follow-up    Breathing is about the same "maybe a little worse".  He is using his rescue inhaler 2 x daily on average. He rarely uses neb.   Dyspnea:  MMRC3 = can't walk 100 yards even at a slow pace at a flat grade s stopping due to sob  Even on 02  Cough: qod coughing spells, just clear mucus/ rattling quality  Sleeping: flat bed/ 2 pillows  SABA use: none at hs, during the day if gets hot/stuffy feels he needs saba at rest though hfa not optimal  - see a/p  02: 2- 2.5 - not titrating 02 with activity as rec  rec Please remember to go to the  x-ray department  for your tests - we will call you with the results when they are available.   Work on inhaler technique:    Virtual Visit via Telephone Note 12/30/2018   I connected with Alexander Kirby on 12/30/18 at  2:00 PM EDT by telephone and verified that I am speaking with the correct person using two identifiers.   I discussed the limitations, risks, security and privacy concerns of performing an evaluation and management service by telephone and the availability of in person appointments. I also discussed with the patient that there may be a patient responsible charge related to this service. The patient expressed understanding and agreed to proceed.   History of Present Illness: Dyspnea:  Very sedentary with covid restrictions Cough: no change sporadic pattern, clear mucus  Sleeping: 2-3 pillows  SABA use: once a day when overdoes it/ hfa only , neb  02:  2 - 2.5 lpm rec No change rx    Virtual Visit via Telephone Note 04/02/2019   I connected with Alexander Kirby on 04/02/19 at  [place] AM by telephone and verified that I am speaking with the correct person  using two identifiers.   I discussed the limitations, risks, security and privacy concerns of performing an evaluation and management service by telephone and the availability of in person appointments. I also discussed with the patient that there may be a patient responsible charge related to this service. The patient expressed understanding and agreed to proceed.   History of Present Illness: Dyspnea:  Room to room  Cough: some spells qod avg /variably productive white  Sleeping: bed is flat / 2 pillows  SABA use: once a day avg when over does it, hfa only, no neb need 02: 2lpm    No obvious day to day or daytime variability or assoc purulent sputum or mucus plugs or hemoptysis or cp or chest tightness, subjective wheeze or overt sinus or hb symptoms.    Also denies any obvious fluctuation of symptoms with weather or environmental changes or other aggravating or alleviating factors except as outlined above.   Meds reviewed/ med reconciliation completed        Observations/Objective: Sounds good on phone though speaks in phrases, no rattling or obvious congestion     Assessment and Plan: See problem list for active a/p's   Follow Up Instructions: See avs for instructions unique to this ov which includes revised/ updated med list     I discussed the assessment and treatment plan with the patient. The patient was provided an opportunity to ask questions and all were answered. The patient agreed with the plan and demonstrated an understanding of the instructions.   The patient was advised to call back or seek an in-person evaluation if the symptoms worsen or if the condition fails to improve as anticipated.  I provided 25 minutes of non-face-to-face time during this encounter.   Christinia Gully, MD

## 2019-04-02 NOTE — Patient Instructions (Signed)
No change in medications  Only use your albuterol as a rescue medication to be used if you can't catch your breath by resting or doing a relaxed purse lip breathing pattern.  - The less you use it, the better it will work when you need it. - Ok to use up to 2 puffs  every 4 hours if you must but call for immediate appointment if use goes up over your usual need - Don't leave home without it !!  (think of it like the spare tire for your car)   Please schedule a follow up visit in 6 months but call sooner if needed

## 2019-08-20 ENCOUNTER — Telehealth: Payer: Self-pay | Admitting: Internal Medicine

## 2019-08-20 NOTE — Telephone Encounter (Signed)
Called the number provided and no answer- no option to lm

## 2019-08-21 NOTE — Telephone Encounter (Signed)
Instructions from last OV 04/02/19  No change in medications  Only use your albuterol as a rescue medication to be used if you can't catch your breath by resting or doing a relaxed purse lip breathing pattern.  - The less you use it, the better it will work when you need it. - Ok to use up to 2 puffs  every 4 hours if you must but call for immediate appointment if use goes up over your usual need - Don't leave home without it !!  (think of it like the spare tire for your car)   Please schedule a follow up visit in 6 months but call sooner if needed      Called and spoke with pt's wife Jana Half. Jana Half stated they have had a change with their insurance and due to this, pt's mail order pharmacy has been switched from [facility] to [facility]. I have made this pharmacy be one of the preferred.  Also while speaking with her, I went ahead and scheduled pt a 6 month follow up and made it a televisit at pt's request. Nothing further needed.

## 2019-09-04 ENCOUNTER — Other Ambulatory Visit: Payer: Self-pay | Admitting: Internal Medicine

## 2019-09-04 MED ORDER — TUDORZA PRESSAIR 400 MCG/ACT IN AEPB: INHALATION_SPRAY | RESPIRATORY_TRACT | 0 refills | Status: DC

## 2019-10-01 ENCOUNTER — Ambulatory Visit (INDEPENDENT_AMBULATORY_CARE_PROVIDER_SITE_OTHER): Payer: Medicare Other | Admitting: Internal Medicine

## 2019-10-01 ENCOUNTER — Encounter: Payer: Self-pay | Admitting: Internal Medicine

## 2019-10-01 ENCOUNTER — Other Ambulatory Visit: Payer: Self-pay

## 2019-10-01 DIAGNOSIS — R911 Solitary pulmonary nodule: Secondary | ICD-10-CM

## 2019-10-01 DIAGNOSIS — J9612 Chronic respiratory failure with hypercapnia: Secondary | ICD-10-CM

## 2019-10-01 DIAGNOSIS — J449 Chronic obstructive pulmonary disease, unspecified: Secondary | ICD-10-CM

## 2019-10-01 NOTE — Assessment & Plan Note (Signed)
02 dep since 2015  - 03/24/2014   Walked 2 lpm nl pace but stopped x 2  And completed  one lap @ 185 stopped due to   Sob with adequate sats - 03/24/14 HCO3 = 33 c/w hypercarbia   rx as of 10/01/2019  = 2-2.5 lpm 24/7 unless needs to titrate up walking with goal of > 90%

## 2019-10-01 NOTE — Assessment & Plan Note (Signed)
1st viz on plain cxr 01/28/14 CT chest on 08/31/15 : 1.9 cm mass on the left abutting the visceral pleura and complete RML atx , high grade stenosis of proximal right mainstem bronchus w/ notable endobronchial mass. And a Exophytic 2.1cm mass distal body of stomach. No mediastinal or hilar adnenopathy noted.  >PET scan [id number]  >hypermetabolic LLL nodule  @ 2.2 suv and neg otherwise  - cxr 01/28/2018  No change LLL nodule  - cxr 08/13/2018   Min increase on PA, not viz on lateral  rec f/u with cxr > says afraid to leave house due to covid though he is fully vaccinated > f/u in 6 months in office with cxr on return strongly rec, call sooner if new symptoms

## 2019-10-01 NOTE — Assessment & Plan Note (Signed)
Quit smoking 1990s  - PFTs 03/24/2014 FEV1  0.85 (27%) ratio 45 and dlco 26 corrects to 52%  - 03/24/14 trial of LAMA and d/c SAMA = tudorza 1 bid  - 08/11/2014  100% with dpi  - 08/11/2014 declined rehab  - re- instructed on flutter 01/03/2016   - 07/03/2016   try spiriva in place of tudorza which is not covered on plan > ? Muscle aches - 10/02/2016  After extensive coaching HFA effectiveness =    90% with SMI > rechallenge with spriva respimat but again had neck muscle aches resolved off it > approved for tudorza and better again  - 01/28/2018  After extensive coaching inhaler device  effectiveness =    90%    Pt is Group B in terms of symptom/risk and laba/lama therefore appropriate rx at this point >>>  Continue symb/ tudorza  May need home pulmonary rehab if not able to mobilize better in the coming months

## 2019-10-01 NOTE — Patient Instructions (Signed)
No change in your medications your 02  Make sure you check your oxygen saturations at highest level of activity to be sure it stays over 90% and adjust upward to maintain this level if needed but remember to turn it back to previous settings when you stop (to conserve your supply).   Return in 6 months, call sooner if needed

## 2019-10-01 NOTE — Progress Notes (Signed)
Subjective:   Patient ID: Alexander Kirby, male    DOB: [birth year],  MRN: [id number]    Brief patient profile:  66 yowm quit smoking 1994 admit to [facility] 2014 but improved to point where did not need 02 as of March 2015 but persistent doe x 100 ft on  Level grade got worse April 2015 with sob and cough with production of of green mucus some better p steroid shot referred to pulmonary clinic 01/27/2014 by Dr Lisbeth Ply with documented GOLD IV COPD 03/24/14     History of Present Illness  01/27/2014 1st [facility] office visit/ Semaj Coburn  Chief Complaint  Patient presents with  . Pulmonary Consult    Referred by Dr. Daiva Eves. Pt states dxed with COPD approx 10 yrs ago. Pt c/o increased cough and SOB since April 2014. He gets SOB walking minimal distances.  Cough is prod with moderate green sputum.   usual routine > sleeps sitting  up on several pillows / sob when wakes but not typically prematurely or full of phlegm, then immediately use duoneb / uses symbicort 160 last but poor hfa  rec Plan A = automatic =symbicort 160 Take 2 puffs first thing in am and then another 2 puffs about 12 hours later.                                    ipatropium after symbicort and a total of 4 x daily  Plan B = backup  As needed up to every 4 hours, ok to use  neb alb at home or maxair  Prednisone 10 mg take  4 each am x 2 days,   2 each am x 2 days,  1 each am x 2 days and stop  Augmentin 875 mg take one pill twice daily  X 10 days  Work on inhaler technique:             05/06/2018  f/u ov/Rubylee Zamarripa re: GOLD IV / 02 dep  Chief Complaint  Patient presents with  . Follow-up    Breathing is unchanged. He is using his albuterol inhaler 1-2 x per day. He has not needed neb recently.  Dyspnea:  25-50 ft = MMRC3 = can't walk 100 yards even at a slow pace at a flat grade s stopping due to sob   Cough: min cough mucous Sleeping: flat bed/ 2 pillows  SABA use: 1-2 x daily just the hfa saba  02: 2lpm hs/ 2.5  during the day/ not checking sats with walking as rec  rec No change in medications  Please schedule a follow up visit in 3 months but call sooner if needed with cxr      08/13/2018  f/u ov/Quanetta Truss re: GOLD IV/ 02 dep on symbicort and tudorza  Chief Complaint  Patient presents with  . Follow-up    Breathing is about the same "maybe a little worse".  He is using his rescue inhaler 2 x daily on average. He rarely uses neb.   Dyspnea:  MMRC3 = can't walk 100 yards even at a slow pace at a flat grade s stopping due to sob  Even on 02  Cough: qod coughing spells, just clear mucus/ rattling quality  Sleeping: flat bed/ 2 pillows  SABA use: none at hs, during the day if gets hot/stuffy feels he needs saba at rest though hfa not optimal  - see a/p  02: 2- 2.5 - not titrating 02 with activity as rec  rec Please remember to go to the  x-ray department  for your tests - we will call you with the results when they are available.   Work on inhaler technique:      Virtual Visit via Telephone Note 12/30/2018   I connected with Alexander Kirby on 12/30/18 at  2:00 PM EDT by telephone and verified that I am speaking with the correct person using two identifiers.   I discussed the limitations, risks, security and privacy concerns of performing an evaluation and management service by telephone and the availability of in person appointments. I also discussed with the patient that there may be a patient responsible charge related to this service. The patient expressed understanding and agreed to proceed.   History of Present Illness: Dyspnea:  Very sedentary with covid restrictions Cough: no change sporadic pattern, clear mucus  Sleeping: 2-3 pillows  SABA use: once a day when overdoes it/ hfa only , neb  02:  2 - 2.5 lpm rec No change rx    Virtual Visit via Telephone Note 04/02/2019  History of Present Illness: Dyspnea:  Room to room  Cough: some spells qod avg /variably productive white   Sleeping: bed is flat / 2 pillows  SABA use: once a day avg when over does it, hfa only, no neb need 02: 2lpm   Observations/Objective: Sounds good on phone though speaks in phrases, no rattling or obvious congestion  rec No change in medications Only use your albuterol as a rescue medication to be used if you can't catch your breath     Virtual Visit via Telephone Note 10/01/2019 - got 2nd covid vaccine in Feb [facility]  I connected with Alexander Kirby on 10/01/19 at 10:00 AM EDT by telephone and verified that I am speaking with the correct person using two identifiers.   I discussed the limitations, risks, security and privacy concerns of performing an evaluation and management service by telephone and the availability of in person appointments. I also discussed with the patient that there may be a patient responsible charge related to this service. The patient expressed understanding and agreed to proceed.   History of Present Illness: Dyspnea:  Room to room / never goes out  Cough: spells every few days clear mucus / never dark or bloody Sleeping: bed is flat/ 2-3 pillows  SABA use: avg proair 2 x daily / never uses  neb  02: 2-3 24/7 with sats mid 90s walking    No obvious day to day or daytime variability or assoc  purulent sputum or mucus plugs or hemoptysis or cp or chest tightness, subjective wheeze or overt sinus or hb symptoms.    Also denies any obvious fluctuation of symptoms with weather or environmental changes or other aggravating or alleviating factors except as outlined above.  Meds reviewed/ med reconciliation completed      Observations/Objective: Speaks in full phrases, good voice texture, minimal rattling on voluntary cough    Assessment and Plan: See problem list for active a/p's   Follow Up Instructions: See avs for instructions unique to this ov which includes revised/ updated med list     I discussed the assessment and treatment plan with the patient.  The patient was provided an opportunity to ask questions and all were answered. The patient agreed with the plan and demonstrated an understanding of the instructions.   The patient was advised to call back or  seek an in-person evaluation if the symptoms worsen or if the condition fails to improve as anticipated.  I provided 15  minutes of non-face-to-face time during this encounter.   Christinia Gully, MD

## 2019-11-18 ENCOUNTER — Other Ambulatory Visit: Payer: Self-pay | Admitting: Internal Medicine

## 2020-02-07 ENCOUNTER — Other Ambulatory Visit: Payer: Self-pay | Admitting: Internal Medicine

## 2020-10-11 ENCOUNTER — Telehealth: Payer: Self-pay | Admitting: Internal Medicine

## 2020-10-11 NOTE — Telephone Encounter (Signed)
Called and spoke to pt's wife, Jana Half. She is requesting pt's ROV be a televisit. Pt last seen in 09/2019. Pt states he is still staying at the house as much as he can as he is afraid of contracting covid.   Dr. Melvyn Novas, please advise if ok for pt to have ROV as a televisit. Thanks.

## 2020-10-12 NOTE — Telephone Encounter (Signed)
Yes, ok for televist, add on to end of a schedule and I will try to call him when things quiet down but no way to be sure exactly when that will be (could be after office closes)

## 2020-10-12 NOTE — Telephone Encounter (Signed)
Called spoke with patient.  Rang 10 times but wife answered Appointment made for Thursday at 4:15p but informed patient it could be after 530 when Dr. Melvyn Novas would call. She just wanted to make sure Dr. Melvyn Novas knew she needed time to get to the phone  Nothing further needed at this time

## 2020-10-13 ENCOUNTER — Encounter: Payer: Self-pay | Admitting: Internal Medicine

## 2020-10-13 ENCOUNTER — Other Ambulatory Visit: Payer: Self-pay

## 2020-10-13 ENCOUNTER — Ambulatory Visit (INDEPENDENT_AMBULATORY_CARE_PROVIDER_SITE_OTHER): Payer: Medicare Other | Admitting: Internal Medicine

## 2020-10-13 DIAGNOSIS — R911 Solitary pulmonary nodule: Secondary | ICD-10-CM

## 2020-10-13 DIAGNOSIS — J9612 Chronic respiratory failure with hypercapnia: Secondary | ICD-10-CM

## 2020-10-13 DIAGNOSIS — J449 Chronic obstructive pulmonary disease, unspecified: Secondary | ICD-10-CM

## 2020-10-13 NOTE — Assessment & Plan Note (Signed)
1st viz on plain cxr 01/28/14 CT chest on 08/31/15 : 1.9 cm mass on the left abutting the visceral pleura and complete RML atx , high grade stenosis of proximal right mainstem bronchus w/ notable endobronchial mass. And a Exophytic 2.1cm mass distal body of stomach. No mediastinal or hilar adnenopathy noted.  >PET scan 0/02/4075  >hypermetabolic LLL nodule  @ 2.2 suv and neg otherwise  - cxr 01/28/2018  No change LLL nodule  - cxr 08/13/2018   Min increase on PA, not viz on lateral  Pt reports no change on recent cxr but not in Epic records.   Each maintenance medication was reviewed in detail including most importantly the difference between maintenance and as needed and under what circumstances the prns are to be used.  Please see AVS for specific  Instructions which are unique to this visit and I personally typed out  which were reviewed in detail over the phone with the patient and a copy provided via mail      Discussed in detail all the  indications, usual  risks and alternatives  relative to the benefits with patient who agrees to proceed with conservative f/u as prev agreed.

## 2020-10-13 NOTE — Assessment & Plan Note (Signed)
02 dep since 2015  - 03/24/2014   Walked 2 lpm nl pace but stopped x 2  And completed  one lap @ 185 stopped due to   Sob with adequate sats - 03/24/14 HCO3 = 33 c/w hypercarbia   rx as of 10/13/2020  = 2-2.5 lpm 24/7 unless needs to titrate up walking with goal of > 90%

## 2020-10-13 NOTE — Assessment & Plan Note (Signed)
Quit smoking 1990s  - PFTs 03/24/2014 FEV1  0.85 (27%) ratio 45 and dlco 26 corrects to 52%  - 03/24/14 trial of LAMA and d/c SAMA = tudorza 1 bid  - 08/11/2014  100% with dpi  - 08/11/2014 declined rehab  - re- instructed on flutter 01/03/2016   - 07/03/2016   try spiriva in place of tudorza which is not covered on plan > ? Muscle aches - 10/02/2016  After extensive coaching HFA effectiveness =    90% with SMI > rechallenge with spriva respimat but again had neck muscle aches resolved off it > approved for tudorza and better again  - 01/28/2018  After extensive coaching inhaler device  effectiveness =    90%    Group D in terms of symptom/risk and laba/lama/ICS  therefore appropriate rx at this point >>>  Continue symb/tudorza and prn saba  Re saba: I spent extra time with pt today reviewing appropriate use of albuterol for prn use on exertion with the following points: 1) saba is for relief of sob that does not improve by walking a slower pace or resting but rather if the pt does not improve after trying this first. 2) If the pt is convinced, as many are, that saba helps recover from activity faster then it's easy to tell if this is the case by re-challenging : ie stop, take the inhaler, then p 5 minutes try the exact same activity (intensity of workload) that just caused the symptoms and see if they are substantially diminished or not after saba 3) if there is an activity that reproducibly causes the symptoms, try the saba 15 min before the activity on alternate days   If in fact the saba really does help, then fine to continue to use it prn but advised may need to look closer at the maintenance regimen being used to achieve better control of airways disease with exertion.

## 2020-10-13 NOTE — Progress Notes (Signed)
Subjective:   Patient ID: Alexander Kirby, male    DOB: 1934-07-04,  MRN: [id number]    Brief patient profile:  75 yowm quit smoking 1994 admit to [facility] 2014 but improved to point where did not need 02 as of March 2015 but persistent doe x 100 ft on  Level grade got worse April 2015 with sob and cough with production of of green mucus some better p steroid shot referred to pulmonary clinic 01/27/2014 by Dr Lisbeth Ply with documented GOLD IV COPD 03/24/14     History of Present Illness  01/27/2014 1st [facility] office visit/ Spike Desilets  Chief Complaint  Patient presents with  . Pulmonary Consult    Referred by Dr. Daiva Eves. Pt states dxed with COPD approx 10 yrs ago. Pt c/o increased cough and SOB since April 2014. He gets SOB walking minimal distances.  Cough is prod with moderate green sputum.   usual routine > sleeps sitting  up on several pillows / sob when wakes but not typically prematurely or full of phlegm, then immediately use duoneb / uses symbicort 160 last but poor hfa  rec Plan A = automatic =symbicort 160 Take 2 puffs first thing in am and then another 2 puffs about 12 hours later.                                    ipatropium after symbicort and a total of 4 x daily  Plan B = backup  As needed up to every 4 hours, ok to use  neb alb at home or maxair  Prednisone 10 mg take  4 each am x 2 days,   2 each am x 2 days,  1 each am x 2 days and stop  Augmentin 875 mg take one pill twice daily  X 10 days  Work on inhaler technique:             05/06/2018  f/u ov/Drevin Ortner re: GOLD IV / 02 dep  Chief Complaint  Patient presents with  . Follow-up    Breathing is unchanged. He is using his albuterol inhaler 1-2 x per day. He has not needed neb recently.  Dyspnea:  25-50 ft = MMRC3 = can't walk 100 yards even at a slow pace at a flat grade s stopping due to sob   Cough: min cough mucous Sleeping: flat bed/ 2 pillows  SABA use: 1-2 x daily just the hfa saba  02: 2lpm hs/ 2.5  during the day/ not checking sats with walking as rec  rec No change in medications  Please schedule a follow up visit in 3 months but call sooner if needed with cxr      08/13/2018  f/u ov/Naydeline Morace re: GOLD IV/ 02 dep on symbicort and tudorza  Chief Complaint  Patient presents with  . Follow-up    Breathing is about the same "maybe a little worse".  He is using his rescue inhaler 2 x daily on average. He rarely uses neb.   Dyspnea:  MMRC3 = can't walk 100 yards even at a slow pace at a flat grade s stopping due to sob  Even on 02  Cough: qod coughing spells, just clear mucus/ rattling quality  Sleeping: flat bed/ 2 pillows  SABA use: none at hs, during the day if gets hot/stuffy feels he needs saba at rest though hfa not optimal  - see a/p  02: 2- 2.5 - not titrating 02 with activity as rec  rec Please remember to go to the  x-ray department  for your tests - we will call you with the results when they are available.   Work on inhaler technique:      Virtual Visit via Telephone Note 12/30/2018   I connected with Alexander Kirby on 12/30/18 at  2:00 PM EDT by telephone and verified that I am speaking with the correct person using two identifiers.   I discussed the limitations, risks, security and privacy concerns of performing an evaluation and management service by telephone and the availability of in person appointments. I also discussed with the patient that there may be a patient responsible charge related to this service. The patient expressed understanding and agreed to proceed.   History of Present Illness: Dyspnea:  Very sedentary with covid restrictions Cough: no change sporadic pattern, clear mucus  Sleeping: 2-3 pillows  SABA use: once a day when overdoes it/ hfa only , neb  02:  2 - 2.5 lpm rec No change rx   Virtual Visit via Telephone Note 04/02/2019  History of Present Illness: Dyspnea:  Room to room  Cough: some spells qod avg /variably productive white  Sleeping:  bed is flat / 2 pillows  SABA use: once a day avg when over does it, hfa only, no neb need 02: 2lpm   Observations/Objective: Sounds good on phone though speaks in phrases, no rattling or obvious congestion  rec No change in medications Only use your albuterol as a rescue medication to be used if you can't catch your breath     Virtual Visit via Telephone Note 10/01/2019 - got 2nd covid vaccine in Feb 2021  moderna History of Present Illness: Dyspnea:  Room to room / never goes out  Cough: spells every few days clear mucus / never dark or bloody Sleeping: bed is flat/ 2-3 pillows  SABA use: avg proair 2 x daily / never uses  neb  02: 2-3 24/7 with sats mid 90s walking  rec No change in your medications  Make sure you check your oxygen saturations at highest level of activity     Virtual Visit via Telephone Note 10/13/2020   I connected with Alexander Kirby on 10/13/20 at 11:50 am   by telephone and verified that I am speaking with the correct person using two identifiers. Pt is at home and this call made from my office with no other participants    I discussed the limitations, risks, security and privacy concerns of performing an evaluation and management service by telephone and the availability of in person appointments. I also discussed with the patient that there may be a patient responsible charge related to this service. The patient expressed understanding and agreed to proceed.   History of Present Illness: Dyspnea:  Room to room  Cough: sporadic min productive clear  Sleeping: flat bed / 2 pillows  SABA use: not as much lately /  02: 2-3 lpm - not checking with activity    No obvious day to day or daytime variability or assoc excess/ purulent sputum or mucus plugs or hemoptysis or cp or chest tightness, subjective wheeze or overt sinus or hb symptoms.    Also denies any obvious fluctuation of symptoms with weather or environmental changes or other aggravating or alleviating  factors except as outlined above.   Meds reviewed/ med reconciliation completed        Observations/Objective: Speaking full  sentences/ no spont cough, good voice texture s conversational sob  Assessment and Plan: See problem list for active a/p's   Follow Up Instructions: See avs for instructions unique to this ov which includes revised/ updated med list     I discussed the assessment and treatment plan with the patient. The patient was provided an opportunity to ask questions and all were answered. The patient agreed with the plan and demonstrated an understanding of the instructions.   The patient was advised to call back or seek an in-person evaluation if the symptoms worsen or if the condition fails to improve as anticipated.  I provided  28 minutes of non-face-to-face time during this encounter.   Christinia Gully, MD

## 2020-10-13 NOTE — Patient Instructions (Addendum)
Plan A = Automatic = Always=    symbicort and tudorza as you are about 12 hours apart   Plan B = Backup (to supplement plan A, not to replace it) Only use your albuterol inhaler as a rescue medication to be used if you can't catch your breath by resting or doing a relaxed purse lip breathing pattern.  - The less you use it, the better it will work when you need it. - Ok to use the inhaler up to 2 puffs  every 4 hours if you must but call for appointment if use goes up over your usual need - Don't leave home without it !!  (think of it like the spare tire for your car)   Plan C = Crisis (instead of Plan B but only if Plan B stops working) - only use your albuterol nebulizer if you first try Plan B and it fails to help > ok to use the nebulizer up to every 4 hours but if start needing it regularly call for immediate appointment  Make sure you check your oxygen saturation  at your highest level of activity  to be sure it stays over 90% and adjust  02 flow upward to maintain this level if needed but remember to turn it back to previous settings when you stop (to conserve your supply).   Please schedule a follow up visit in 6  months but call sooner if needed

## 2020-11-23 ENCOUNTER — Other Ambulatory Visit: Payer: Self-pay | Admitting: Internal Medicine

## 2021-01-04 ENCOUNTER — Other Ambulatory Visit: Payer: Self-pay | Admitting: Internal Medicine

## 2021-03-02 ENCOUNTER — Telehealth: Payer: Self-pay | Admitting: Internal Medicine

## 2021-03-02 MED ORDER — BUDESONIDE-FORMOTEROL FUMARATE 160-4.5 MCG/ACT IN AERO: INHALATION_SPRAY | RESPIRATORY_TRACT | 3 refills | Status: DC

## 2021-03-02 NOTE — Telephone Encounter (Signed)
Call made to patient wife Jana Half [facility]), confirmed patient DOB. Confirmed medication and pharmacy. Refill sent. She questioned if we asked Dr Melvyn Novas about his appt. I made her aware nothing was mentioned about an appt but I can ask. She states the patient has not been out since covid started and is wanting the f/u to be a televisit. They do not have capabilities for mychart. I asked whether he was just not wanting to come out or is there some physical reason as to why he cannot come out. She stated he was just afraid to come out.   MW please advise if patient can have his 6 month f/u has televisit. Thanks :)

## 2021-03-02 NOTE — Telephone Encounter (Signed)
LMTCB  Appt made. Need to notify patient of appt.

## 2021-03-02 NOTE — Telephone Encounter (Signed)
Can only do televist if it's a well check up - if having any new or worse resp problems needs to come to office

## 2021-03-23 NOTE — Telephone Encounter (Signed)
Called and spoke to [place], pt's wife. She states they are aware of the appt time and date. Nothing further needed at this time.

## 2021-04-04 ENCOUNTER — Encounter: Payer: Self-pay | Admitting: Internal Medicine

## 2021-04-04 ENCOUNTER — Ambulatory Visit (INDEPENDENT_AMBULATORY_CARE_PROVIDER_SITE_OTHER): Payer: Medicare Other | Admitting: Internal Medicine

## 2021-04-04 ENCOUNTER — Other Ambulatory Visit: Payer: Self-pay

## 2021-04-04 DIAGNOSIS — J449 Chronic obstructive pulmonary disease, unspecified: Secondary | ICD-10-CM

## 2021-04-04 DIAGNOSIS — J9612 Chronic respiratory failure with hypercapnia: Secondary | ICD-10-CM | POA: Diagnosis not present

## 2021-04-04 NOTE — Assessment & Plan Note (Signed)
Quit smoking 1990s  - PFTs 03/24/2014 FEV1  0.85 (27%) ratio 45 and dlco 26 corrects to 52%  - 03/24/14 trial of LAMA and d/c SAMA = tudorza 1 bid  - 08/11/2014  100% with dpi  - 08/11/2014 declined rehab  - re- instructed on flutter 01/03/2016   - 07/03/2016   try spiriva in place of tudorza which is not covered on plan > ? Muscle aches - 10/02/2016  After extensive coaching HFA effectiveness =    90% with SMI > rechallenge with spriva respimat but again had neck muscle aches resolved off it > approved for tudorza and better again  - 01/28/2018  After extensive coaching inhaler device  effectiveness =    90%    Group D in terms of symptom/risk and laba/lama/ICS  therefore appropriate rx at this point >>>  Continue symbicort and turdorza and prn saba with ABC action plan again reviewed.

## 2021-04-04 NOTE — Assessment & Plan Note (Signed)
02 dep since 2015  - 03/24/2014   Walked 2 lpm nl pace but stopped x 2  And completed  one lap @ 185 stopped due to   Sob with adequate sats - 03/24/14 HCO3 = 33 c/w hypercarbia  - denies any am HA and rests well hs on 2lpm   rx as of 04/04/2021  = 2-2.5 lpm 24/7 unless needs to titrate up walking with goal of > 90%   Again emphasized goal of low 90s due to hypercarbia and higher need when walks, lower at rest   Each maintenance medication was reviewed in detail including most importantly the difference between maintenance and as needed and under what circumstances the prns are to be used.  Please see AVS for specific  Instructions which are unique to this visit and I personally typed out  which were reviewed in detail over the phone with the patient and a copy provided via regular mail;

## 2021-04-04 NOTE — Patient Instructions (Addendum)
+  Plan A = Automatic = Always=    symbicort and tudorza as you are about 12 hours apart   Plan B = Backup (to supplement plan A, not to replace it) Only use your albuterol inhaler as a rescue medication to be used if you can't catch your breath by resting or doing a relaxed purse lip breathing pattern.  - The less you use it, the better it will work when you need it. - Ok to use the inhaler up to 2 puffs  every 4 hours if you must but call for appointment if use goes up over your usual need - Don't leave home without it !!  (think of it like the spare tire for your car)   Plan C = Crisis (instead of Plan B but only if Plan B stops working) - only use your albuterol nebulizer if you first try Plan B and it fails to help > ok to use the nebulizer up to every 4 hours but if start needing it regularly call for immediate appointment  Make sure you check your oxygen saturation  at your highest level of activity  to be sure it stays over 90% and adjust  02 flow upward to maintain this level if needed but remember to turn it back to previous settings when you stop (to conserve your supply).   Let me know if there are any problems with your 0xygen supply company - give me the full name of your contact and his/her direct number or extension so we can work on it from our end.   Please schedule a follow up visit in 6  months but call sooner if needed

## 2021-04-04 NOTE — Progress Notes (Signed)
Subjective:   Patient ID: Alexander Kirby, male    DOB: 05-Apr-1935,  MRN: [id number]    Brief patient profile:  51 yowm quit smoking 1994 admit to [facility] 2014 but improved to point where did not need 02 as of March 2015 but persistent doe x 100 ft on  Level grade got worse April 2015 with sob and cough with production of of green mucus some better p steroid shot referred to pulmonary clinic 01/27/2014 by Dr Lisbeth Ply with documented GOLD IV COPD 03/24/14     History of Present Illness  01/27/2014 1st [facility] office visit/ Alexander Kirby  Chief Complaint  Patient presents with   Pulmonary Consult    Referred by Dr. Daiva Eves. Pt states dxed with COPD approx 10 yrs ago. Pt c/o increased cough and SOB since April 2014. He gets SOB walking minimal distances.  Cough is prod with moderate green sputum.   usual routine > sleeps sitting  up on several pillows / sob when wakes but not typically prematurely or full of phlegm, then immediately use duoneb / uses symbicort 160 last but poor hfa  rec Plan A = automatic =symbicort 160 Take 2 puffs first thing in am and then another 2 puffs about 12 hours later.                                    ipatropium after symbicort and a total of 4 x daily  Plan B = backup  As needed up to every 4 hours, ok to use  neb alb at home or maxair  Prednisone 10 mg take  4 each am x 2 days,   2 each am x 2 days,  1 each am x 2 days and stop  Augmentin 875 mg take one pill twice daily  X 10 days  Work on inhaler technique:             05/06/2018  f/u ov/Alexander Kirby re: GOLD IV / 02 dep  Chief Complaint  Patient presents with   Follow-up    Breathing is unchanged. He is using his albuterol inhaler 1-2 x per day. He has not needed neb recently.  Dyspnea:  25-50 ft = MMRC3 = can't walk 100 yards even at a slow pace at a flat grade s stopping due to sob   Cough: min cough mucous Sleeping: flat bed/ 2 pillows  SABA use: 1-2 x daily just the hfa saba  02: 2lpm hs/ 2.5  during the day/ not checking sats with walking as rec  rec No change in medications  Please schedule a follow up visit in 3 months but call sooner if needed with cxr      08/13/2018  f/u ov/Alexander Kirby re: GOLD IV/ 02 dep on symbicort and tudorza  Chief Complaint  Patient presents with   Follow-up    Breathing is about the same "maybe a little worse".  He is using his rescue inhaler 2 x daily on average. He rarely uses neb.   Dyspnea:  MMRC3 = can't walk 100 yards even at a slow pace at a flat grade s stopping due to sob  Even on 02  Cough: qod coughing spells, just clear mucus/ rattling quality  Sleeping: flat bed/ 2 pillows  SABA use: none at hs, during the day if gets hot/stuffy feels he needs saba at rest though hfa not optimal  - see a/p  02: 2- 2.5 - not titrating 02 with activity as rec  rec Please remember to go to the  x-ray department  for your tests - we will call you with the results when they are available.   Work on inhaler technique:      Virtual Visit via Telephone Note 12/30/2018  History of Present Illness: Dyspnea:  Very sedentary with covid restrictions Cough: no change sporadic pattern, clear mucus  Sleeping: 2-3 pillows  SABA use: once a day when overdoes it/ hfa only , neb  02:  2 - 2.5 lpm rec No change rx   Virtual Visit via Telephone Note 04/02/2019  History of Present Illness: Dyspnea:  Room to room  Cough: some spells qod avg /variably productive white  Sleeping: bed is flat / 2 pillows  SABA use: once a day avg when over does it, hfa only, no neb need 02: 2lpm   Observations/Objective: Sounds good on phone though speaks in phrases, no rattling or obvious congestion  rec No change in medications Only use your albuterol as a rescue medication to be used if you can't catch your breath     Virtual Visit via Telephone Note 10/01/2019 - got 2nd covid vaccine in Feb 2021  [facility]a History of Present Illness: Dyspnea:  Room to room / never goes out   Cough: spells every few days clear mucus / never dark or bloody Sleeping: bed is flat/ 2-3 pillows  SABA use: avg proair 2 x daily / never uses  neb  02: 2-3 24/7 with sats mid 90s walking  rec No change in your medications  Make sure you check your oxygen saturations at highest level of activity     Virtual Visit via Telephone Note 10/13/2020  History of Present Illness: Dyspnea:  Room to room  Cough: sporadic min productive clear  Sleeping: flat bed / 2 pillows  SABA use: not as much lately /  02: 2-3 lpm - not checking with activity  Rec Plan A = Automatic = Always=    symbicort and tudorza as you are about 12 hours apart  Plan B = Backup (to supplement plan A, not to replace it) Only use your albuterol inhaler as a rescue medication Plan C = Crisis (instead of Plan B but only if Plan B stops working) - only use your albuterol nebulizer if you first try Plan B  Make sure you check your oxygen saturation  at your highest level of activity    Virtual Visit via Telephone Note 04/04/2021   I connected with Alexander Kirby on 04/04/21 at  8:25 am by telephone and verified that I am speaking with the correct person using two identifiers. Pt is at home and this call made from my office with no other participants    I discussed the limitations, risks, security and privacy concerns of performing an evaluation and management service by telephone and the availability of in person appointments. I also discussed with the patient that there may be a patient responsible charge related to this service. The patient expressed understanding and agreed to proceed.   History of Present Illness:  GOLD IV/ 02 dep/ maint on symb/tudorza Dyspnea:  ambulating inside the house with a cane  Cough: variable Sleeping: bed is flat with 2 pillows SABA use: no longer  02: 2- 3 lpm    No obvious day to day or daytime variability or assoc excess/ purulent sputum or mucus plugs or hemoptysis or cp or chest  tightness,  subjective wheeze or overt sinus or hb symptoms.    Also denies any obvious fluctuation of symptoms with weather or environmental changes or other aggravating or alleviating factors except as outlined above.   Meds reviewed/ med reconciliation completed     No outpatient medications have been marked as taking for the 04/04/21 encounter (Office Visit) with Alexander Rockers, MD.         Observations/Objective: Good voice texture, no spontaneous cough or rattling, speaking in full sentences    Assessment and Plan: See problem list for active a/p's   Follow Up Instructions: See avs for instructions unique to this ov which includes revised/ updated med list     I discussed the assessment and treatment plan with the patient. The patient was provided an opportunity to ask questions and all were answered. The patient agreed with the plan and demonstrated an understanding of the instructions.   The patient was advised to call back or seek an in-person evaluation if the symptoms worsen or if the condition fails to improve as anticipated.  I provided 24  minutes of non-face-to-face time during this encounter.   Christinia Gully, MD

## 2021-06-22 ENCOUNTER — Other Ambulatory Visit: Payer: Self-pay | Admitting: Internal Medicine

## 2021-07-26 ENCOUNTER — Telehealth: Payer: Self-pay | Admitting: Internal Medicine

## 2021-07-26 MED ORDER — TUDORZA PRESSAIR 400 MCG/ACT IN AEPB: INHALATION_SPRAY | RESPIRATORY_TRACT | 3 refills | Status: DC

## 2021-07-26 MED ORDER — BUDESONIDE-FORMOTEROL FUMARATE 160-4.5 MCG/ACT IN AERO: INHALATION_SPRAY | RESPIRATORY_TRACT | 3 refills | Status: DC

## 2021-07-26 NOTE — Telephone Encounter (Signed)
Spoke with Jana Half, spouse  She says pt needs refills on symbicort and tudorza  I refilled these and advised he would need ov for any more  I offered video visit and she refused  She states pt is unable to leave the home  She also wanted to have the last televisit from Oct 2022 AVS mailed  I have verified his address and mailed this  Nothing further needed

## 2022-05-31 ENCOUNTER — Other Ambulatory Visit: Payer: Self-pay | Admitting: Internal Medicine

## 2024-03-18 DEATH — deceased
